# Patient Record
Sex: Female | Born: 1945 | Race: Black or African American | Hispanic: No | Smoking: Former smoker
Health system: Southern US, Community
[De-identification: ages and names within clinical notes are randomized; demographics above are authoritative.]

## PROBLEM LIST (undated history)

## (undated) DIAGNOSIS — K7460 Unspecified cirrhosis of liver: Secondary | ICD-10-CM

## (undated) DIAGNOSIS — Z789 Other specified health status: Secondary | ICD-10-CM

## (undated) DIAGNOSIS — D869 Sarcoidosis, unspecified: Secondary | ICD-10-CM

## (undated) DIAGNOSIS — E7800 Pure hypercholesterolemia, unspecified: Secondary | ICD-10-CM

## (undated) DIAGNOSIS — S065X9A Traumatic subdural hemorrhage with loss of consciousness of unspecified duration, initial encounter: Secondary | ICD-10-CM

## (undated) DIAGNOSIS — IMO0001 Reserved for inherently not codable concepts without codable children: Secondary | ICD-10-CM

## (undated) DIAGNOSIS — K219 Gastro-esophageal reflux disease without esophagitis: Secondary | ICD-10-CM

## (undated) DIAGNOSIS — E11319 Type 2 diabetes mellitus with unspecified diabetic retinopathy without macular edema: Secondary | ICD-10-CM

## (undated) DIAGNOSIS — E119 Type 2 diabetes mellitus without complications: Secondary | ICD-10-CM

## (undated) DIAGNOSIS — S065XAA Traumatic subdural hemorrhage with loss of consciousness status unknown, initial encounter: Secondary | ICD-10-CM

## (undated) DIAGNOSIS — I10 Essential (primary) hypertension: Secondary | ICD-10-CM

## (undated) DIAGNOSIS — I639 Cerebral infarction, unspecified: Secondary | ICD-10-CM

## (undated) HISTORY — DX: Cerebral infarction, unspecified: I63.9

## (undated) HISTORY — PX: CHOLECYSTECTOMY: SHX55

## (undated) HISTORY — PX: KNEE SURGERY: SHX244

---

## 2015-02-23 ENCOUNTER — Emergency Department (HOSPITAL_BASED_OUTPATIENT_CLINIC_OR_DEPARTMENT_OTHER)
Admission: EM | Admit: 2015-02-23 | Discharge: 2015-02-23 | Disposition: A | Discharge status: Home / Self Care | Payer: Medicare Other | Attending: Emergency Medicine | Admitting: Emergency Medicine

## 2015-02-23 ENCOUNTER — Encounter (HOSPITAL_BASED_OUTPATIENT_CLINIC_OR_DEPARTMENT_OTHER): Payer: Self-pay | Admitting: *Deleted

## 2015-02-23 DIAGNOSIS — Z862 Personal history of diseases of the blood and blood-forming organs and certain disorders involving the immune mechanism: Secondary | ICD-10-CM | POA: Insufficient documentation

## 2015-02-23 DIAGNOSIS — Z87891 Personal history of nicotine dependence: Secondary | ICD-10-CM | POA: Diagnosis not present

## 2015-02-23 DIAGNOSIS — Z7982 Long term (current) use of aspirin: Secondary | ICD-10-CM | POA: Insufficient documentation

## 2015-02-23 DIAGNOSIS — N390 Urinary tract infection, site not specified: Secondary | ICD-10-CM | POA: Insufficient documentation

## 2015-02-23 DIAGNOSIS — Z79899 Other long term (current) drug therapy: Secondary | ICD-10-CM | POA: Insufficient documentation

## 2015-02-23 DIAGNOSIS — K219 Gastro-esophageal reflux disease without esophagitis: Secondary | ICD-10-CM | POA: Insufficient documentation

## 2015-02-23 DIAGNOSIS — E119 Type 2 diabetes mellitus without complications: Secondary | ICD-10-CM | POA: Diagnosis not present

## 2015-02-23 DIAGNOSIS — Z794 Long term (current) use of insulin: Secondary | ICD-10-CM | POA: Insufficient documentation

## 2015-02-23 DIAGNOSIS — I10 Essential (primary) hypertension: Secondary | ICD-10-CM | POA: Insufficient documentation

## 2015-02-23 DIAGNOSIS — R1110 Vomiting, unspecified: Secondary | ICD-10-CM | POA: Diagnosis present

## 2015-02-23 DIAGNOSIS — K529 Noninfective gastroenteritis and colitis, unspecified: Secondary | ICD-10-CM | POA: Diagnosis not present

## 2015-02-23 HISTORY — DX: Essential (primary) hypertension: I10

## 2015-02-23 HISTORY — DX: Type 2 diabetes mellitus without complications: E11.9

## 2015-02-23 HISTORY — DX: Sarcoidosis, unspecified: D86.9

## 2015-02-23 HISTORY — DX: Gastro-esophageal reflux disease without esophagitis: K21.9

## 2015-02-23 LAB — URINE MICROSCOPIC-ADD ON

## 2015-02-23 LAB — CBC WITH DIFFERENTIAL/PLATELET
BASOS ABS: 0 10*3/uL (ref 0.0–0.1)
BASOS PCT: 0 % (ref 0–1)
EOS ABS: 0.1 10*3/uL (ref 0.0–0.7)
EOS PCT: 1 % (ref 0–5)
HEMATOCRIT: 35.6 % — AB (ref 36.0–46.0)
HEMOGLOBIN: 12.5 g/dL (ref 12.0–15.0)
Lymphocytes Relative: 37 % (ref 12–46)
Lymphs Abs: 2.9 10*3/uL (ref 0.7–4.0)
MCH: 27.2 pg (ref 26.0–34.0)
MCHC: 35.1 g/dL (ref 30.0–36.0)
MCV: 77.6 fL — AB (ref 78.0–100.0)
MONO ABS: 0.7 10*3/uL (ref 0.1–1.0)
MONOS PCT: 8 % (ref 3–12)
Neutro Abs: 4.2 10*3/uL (ref 1.7–7.7)
Neutrophils Relative %: 54 % (ref 43–77)
Platelets: 277 10*3/uL (ref 150–400)
RBC: 4.59 MIL/uL (ref 3.87–5.11)
RDW: 12.8 % (ref 11.5–15.5)
WBC: 7.9 10*3/uL (ref 4.0–10.5)

## 2015-02-23 LAB — COMPREHENSIVE METABOLIC PANEL
ALK PHOS: 95 U/L (ref 39–117)
ALT: 64 U/L — ABNORMAL HIGH (ref 0–35)
AST: 42 U/L — AB (ref 0–37)
Albumin: 4.2 g/dL (ref 3.5–5.2)
Anion gap: 8 (ref 5–15)
BUN: 19 mg/dL (ref 6–23)
CHLORIDE: 104 mmol/L (ref 96–112)
CO2: 28 mmol/L (ref 19–32)
Calcium: 9.5 mg/dL (ref 8.4–10.5)
Creatinine, Ser: 0.75 mg/dL (ref 0.50–1.10)
GFR calc Af Amer: >90 mL/min (ref >90)
GFR calc non Af Amer: 84 mL/min — ABNORMAL LOW (ref >90)
GLUCOSE: 158 mg/dL — AB (ref 70–99)
POTASSIUM: 4 mmol/L (ref 3.5–5.1)
SODIUM: 140 mmol/L (ref 135–145)
Total Bilirubin: 0.3 mg/dL (ref 0.3–1.2)
Total Protein: 8.1 g/dL (ref 6.0–8.3)

## 2015-02-23 LAB — URINALYSIS, ROUTINE W REFLEX MICROSCOPIC
Bilirubin Urine: NEGATIVE
Glucose, UA: NEGATIVE mg/dL
KETONES UR: NEGATIVE mg/dL
Nitrite: NEGATIVE
PH: 5 (ref 5.0–8.0)
Protein, ur: 30 mg/dL — AB
Specific Gravity, Urine: 1.022 (ref 1.005–1.030)
Urobilinogen, UA: 0.2 mg/dL (ref 0.0–1.0)

## 2015-02-23 MED ORDER — CIPROFLOXACIN HCL 500 MG PO TABS: 500.0000 mg | ORAL_TABLET | Freq: Two times a day (BID) | ORAL | Status: DC

## 2015-02-23 MED ORDER — SODIUM CHLORIDE 0.9 % IV BOLUS (SEPSIS)
1000.0000 mL | Freq: Once | INTRAVENOUS | Status: AC
Start: 2015-02-23 — End: 2015-02-23
  Administered 2015-02-23: 1000 mL via INTRAVENOUS

## 2015-02-23 MED ORDER — ONDANSETRON 8 MG PO TBDP: ORAL_TABLET | ORAL | Status: DC

## 2015-02-23 NOTE — ED Provider Notes (Signed)
CSN: [id number]     Arrival date & time 02/23/15  1826 History  This chart was scribed for Monica Speak, MD by Tula Nakayama, ED Scribe. This patient was seen in room MH11/MH11 and the patient's care was started at 6:38 PM.    Chief Complaint  Patient presents with  . Emesis   Patient is a 69 y.o. female presenting with vomiting. The history is provided by the patient. No language interpreter was used.  Emesis Severity:  Moderate Duration:  3 hours Timing:  Intermittent Quality:  Unable to specify Progression:  Unchanged Chronicity:  New Recent urination:  Normal Relieved by:  None tried Worsened by:  Nothing tried Ineffective treatments:  None tried Associated symptoms: no abdominal pain and no fever   Risk factors: no sick contacts and no suspect food intake     HPI Comments: Monica Pearson is a 70 y.o. female with a history of DM, GERD, HTN and Sarcoidosis, who presents to the Emergency Department complaining of constant, moderate nausea that started 2.5 hours ago. She states vomiting as an associated symptoms. Pt has not tried any treatment PTA. Pt reports her blood sugar measured 139 this morning. Her last BM was this morning. She had an artichoke sandwich earlier today. Pt denies abdominal pain, CP, SOB and fever as associated symptoms.   Past Medical History  Diagnosis Date  . Hypertension   . GERD (gastroesophageal reflux disease)   . Diabetes mellitus without complication   . Sarcoidosis    History reviewed. No pertinent past surgical history. History reviewed. No pertinent family history. History  Substance Use Topics  . Smoking status: Former Research scientist (life sciences)  . Smokeless tobacco: Not on file  . Alcohol Use: Not on file   OB History    No data available     Review of Systems  Constitutional: Negative for fever.  Respiratory: Negative for shortness of breath.   Cardiovascular: Negative for chest pain.  Gastrointestinal: Positive for nausea and vomiting. Negative for  abdominal pain.  All other systems reviewed and are negative.  Allergies  Review of patient's allergies indicates no known allergies.  Home Medications   Prior to Admission medications   Medication Sig Start Date End Date Taking? Authorizing Provider  aspirin 81 MG chewable tablet Chew 81 mg by mouth daily.   Yes Historical Provider, MD  atenolol-chlorthalidone (TENORETIC) 100-25 MG per tablet Take 1 tablet by mouth daily.   Yes Historical Provider, MD  benazepril (LOTENSIN) 40 MG tablet Take 40 mg by mouth daily.   Yes Historical Provider, MD  calcium carbonate (OS-CAL) 600 MG TABS tablet Take 600 mg by mouth 2 (two) times daily with a meal.   Yes Historical Provider, MD  insulin NPH-regular Human (NOVOLIN 70/30) (70-30) 100 UNIT/ML injection Inject into the skin. Per sliding scale per pt.   Yes Historical Provider, MD  Liraglutide (VICTOZA [place]) Inject into the skin.   Yes Historical Provider, MD  metFORMIN (GLUCOPHAGE) 500 MG tablet Take 500 mg by mouth 2 (two) times daily with a meal.   Yes Historical Provider, MD  omeprazole (PRILOSEC) 20 MG capsule Take 20 mg by mouth daily.   Yes Historical Provider, MD   BP 171/63 mmHg  Pulse 73  Temp(Src) 98.9 F (37.2 C) (Oral)  Resp 20  Ht 5' 4"  (1.626 m)  Wt 246 lb (111.585 kg)  BMI 42.21 kg/m2  SpO2 94%  LMP  (Approximate) Physical Exam  Constitutional: She is oriented to person, place, and time. She appears  well-developed and well-nourished. No distress.  HENT:  Head: Normocephalic and atraumatic.  Eyes: EOM are normal.  Neck: Normal range of motion.  Cardiovascular: Normal rate, regular rhythm and normal heart sounds.   Pulmonary/Chest: Effort normal and breath sounds normal.  Abdominal: Soft. She exhibits no distension. There is no tenderness.  Musculoskeletal: Normal range of motion.  Neurological: She is alert and oriented to person, place, and time.  Skin: Skin is warm and dry.  Psychiatric: She has a normal mood and affect.  Judgment normal.  Nursing note and vitals reviewed.   ED Course  Procedures   DIAGNOSTIC STUDIES: Oxygen Saturation is 94% on RA, normal by my interpretation.    COORDINATION OF CARE: 6:42 PM Discussed treatment plan with pt which includes lab work and IV fluids. Pt agreed to plan.   Labs Review Labs Reviewed  CBC WITH DIFFERENTIAL/PLATELET  COMPREHENSIVE METABOLIC PANEL  URINALYSIS, ROUTINE W REFLEX MICROSCOPIC    Imaging Review No results found.   EKG Interpretation None      MDM   Final diagnoses:  None    Patient is a 69 year old female who presents with complaints of nausea, vomiting, and diarrhea that started several hours prior to presentation. Her presentation exam, and workup are consistent with a viral gastroenteritis. She is feeling better with medications in the ER and I believe is appropriate for discharge. She also believes she may have a UTI and her urinalysis does reflect this. I feel this is more of an incidental finding as opposed to the cause of her vomiting and diarrhea, but will be treated with antibiotics and when necessary return.  I personally performed the services described in this documentation, which was scribed in my presence. The recorded information has been reviewed and is accurate.      Monica Speak, MD 02/23/15 2023

## 2015-02-23 NOTE — Discharge Instructions (Signed)
Zofran as prescribed as needed for nausea.  Cipro as prescribed for your urinary tract infection.  Return to the emergency department for severe abdominal pain, bloody stool, or other new and concerning symptoms.   Urinary Tract Infection Urinary tract infections (UTIs) can develop anywhere along your urinary tract. Your urinary tract is your body's drainage system for removing wastes and extra water. Your urinary tract includes two kidneys, two ureters, a bladder, and a urethra. Your kidneys are a pair of bean-shaped organs. Each kidney is about the size of your fist. They are located below your ribs, one on each side of your spine. CAUSES Infections are caused by microbes, which are microscopic organisms, including fungi, viruses, and bacteria. These organisms are so small that they can only be seen through a microscope. Bacteria are the microbes that most commonly cause UTIs. SYMPTOMS  Symptoms of UTIs may vary by age and gender of the patient and by the location of the infection. Symptoms in young women typically include a frequent and intense urge to urinate and a painful, burning feeling in the bladder or urethra during urination. Older women and men are more likely to be tired, shaky, and weak and have muscle aches and abdominal pain. A fever may mean the infection is in your kidneys. Other symptoms of a kidney infection include pain in your back or sides below the ribs, nausea, and vomiting. DIAGNOSIS To diagnose a UTI, your caregiver will ask you about your symptoms. Your caregiver also will ask to provide a urine sample. The urine sample will be tested for bacteria and white blood cells. White blood cells are made by your body to help fight infection. TREATMENT  Typically, UTIs can be treated with medication. Because most UTIs are caused by a bacterial infection, they usually can be treated with the use of antibiotics. The choice of antibiotic and length of treatment depend on your symptoms  and the type of bacteria causing your infection. HOME CARE INSTRUCTIONS  If you were prescribed antibiotics, take them exactly as your caregiver instructs you. Finish the medication even if you feel better after you have only taken some of the medication.  Drink enough water and fluids to keep your urine clear or pale yellow.  Avoid caffeine, tea, and carbonated beverages. They tend to irritate your bladder.  Empty your bladder often. Avoid holding urine for long periods of time.  Empty your bladder before and after sexual intercourse.  After a bowel movement, women should cleanse from front to back. Use each tissue only once. SEEK MEDICAL CARE IF:   You have back pain.  You develop a fever.  Your symptoms do not begin to resolve within 3 days. SEEK IMMEDIATE MEDICAL CARE IF:   You have severe back pain or lower abdominal pain.  You develop chills.  You have nausea or vomiting.  You have continued burning or discomfort with urination. MAKE SURE YOU:   Understand these instructions.  Will watch your condition.  Will get help right away if you are not doing well or get worse. Document Released: 08/21/2005 Document Revised: 05/12/2012 Document Reviewed: 12/20/2011 [facility] Patient Information 2015 [place], [place]. This information is not intended to replace advice given to you by your health care provider. Make sure you discuss any questions you have with your health care provider.  Viral Gastroenteritis Viral gastroenteritis is also known as stomach flu. This condition affects the stomach and intestinal tract. It can cause sudden diarrhea and vomiting. The illness typically lasts 3  to 8 days. Most people develop an immune response that eventually gets rid of the virus. While this natural response develops, the virus can make you quite ill. CAUSES  Many different viruses can cause gastroenteritis, such as rotavirus or noroviruses. You can catch one of these viruses by  consuming contaminated food or water. You may also catch a virus by sharing utensils or other personal items with an infected person or by touching a contaminated surface. SYMPTOMS  The most common symptoms are diarrhea and vomiting. These problems can cause a severe loss of body fluids (dehydration) and a body salt (electrolyte) imbalance. Other symptoms may include:  Fever.  Headache.  Fatigue.  Abdominal pain. DIAGNOSIS  Your caregiver can usually diagnose viral gastroenteritis based on your symptoms and a physical exam. A stool sample may also be taken to test for the presence of viruses or other infections. TREATMENT  This illness typically goes away on its own. Treatments are aimed at rehydration. The most serious cases of viral gastroenteritis involve vomiting so severely that you are not able to keep fluids down. In these cases, fluids must be given through an intravenous line (IV). HOME CARE INSTRUCTIONS   Drink enough fluids to keep your urine clear or pale yellow. Drink small amounts of fluids frequently and increase the amounts as tolerated.  Ask your caregiver for specific rehydration instructions.  Avoid:  Foods high in sugar.  Alcohol.  Carbonated drinks.  Tobacco.  Juice.  Caffeine drinks.  Extremely hot or cold fluids.  Fatty, greasy foods.  Too much intake of anything at one time.  Dairy products until 24 to 48 hours after diarrhea stops.  You may consume probiotics. Probiotics are active cultures of beneficial bacteria. They may lessen the amount and number of diarrheal stools in adults. Probiotics can be found in yogurt with active cultures and in supplements.  Wash your hands well to avoid spreading the virus.  Only take over-the-counter or prescription medicines for pain, discomfort, or fever as directed by your caregiver. Do not give aspirin to children. Antidiarrheal medicines are not recommended.  Ask your caregiver if you should continue to  take your regular prescribed and over-the-counter medicines.  Keep all follow-up appointments as directed by your caregiver. SEEK IMMEDIATE MEDICAL CARE IF:   You are unable to keep fluids down.  You do not urinate at least once every 6 to 8 hours.  You develop shortness of breath.  You notice blood in your stool or vomit. This may look like coffee grounds.  You have abdominal pain that increases or is concentrated in one small area (localized).  You have persistent vomiting or diarrhea.  You have a fever.  The patient is a child younger than 3 months, and he or she has a fever.  The patient is a child older than 3 months, and he or she has a fever and persistent symptoms.  The patient is a child older than 3 months, and he or she has a fever and symptoms suddenly get worse.  The patient is a baby, and he or she has no tears when crying. MAKE SURE YOU:   Understand these instructions.  Will watch your condition.  Will get help right away if you are not doing well or get worse. Document Released: 11/11/2005 Document Revised: 02/03/2012 Document Reviewed: 08/28/2011 [facility] Patient Information 2015 [place], [place]. This information is not intended to replace advice given to you by your health care provider. Make sure you discuss any questions you  have with your health care provider.

## 2015-02-23 NOTE — ED Notes (Signed)
Pt to room 11 by ems, pt is vomiting into emesis bag, reports sudden onset of n/v at 4pm. Pt reports abd cramping pains, but "no diarrhea yet". Denies fevers, reports normal bm this am.

## 2016-12-14 ENCOUNTER — Encounter (HOSPITAL_BASED_OUTPATIENT_CLINIC_OR_DEPARTMENT_OTHER): Payer: Self-pay | Admitting: Emergency Medicine

## 2016-12-14 ENCOUNTER — Emergency Department (HOSPITAL_BASED_OUTPATIENT_CLINIC_OR_DEPARTMENT_OTHER): Payer: Medicare Other

## 2016-12-14 ENCOUNTER — Observation Stay (HOSPITAL_BASED_OUTPATIENT_CLINIC_OR_DEPARTMENT_OTHER)
Admission: EM | Admit: 2016-12-14 | Discharge: 2016-12-15 | Disposition: A | Discharge status: Home / Self Care | Payer: Medicare Other | Attending: Internal Medicine | Admitting: Internal Medicine

## 2016-12-14 DIAGNOSIS — G4733 Obstructive sleep apnea (adult) (pediatric): Secondary | ICD-10-CM | POA: Diagnosis not present

## 2016-12-14 DIAGNOSIS — Z79899 Other long term (current) drug therapy: Secondary | ICD-10-CM | POA: Diagnosis not present

## 2016-12-14 DIAGNOSIS — Z9989 Dependence on other enabling machines and devices: Secondary | ICD-10-CM

## 2016-12-14 DIAGNOSIS — G8191 Hemiplegia, unspecified affecting right dominant side: Secondary | ICD-10-CM | POA: Diagnosis not present

## 2016-12-14 DIAGNOSIS — R4781 Slurred speech: Secondary | ICD-10-CM | POA: Insufficient documentation

## 2016-12-14 DIAGNOSIS — Z87891 Personal history of nicotine dependence: Secondary | ICD-10-CM | POA: Insufficient documentation

## 2016-12-14 DIAGNOSIS — I10 Essential (primary) hypertension: Secondary | ICD-10-CM | POA: Insufficient documentation

## 2016-12-14 DIAGNOSIS — D869 Sarcoidosis, unspecified: Secondary | ICD-10-CM | POA: Diagnosis not present

## 2016-12-14 DIAGNOSIS — Z794 Long term (current) use of insulin: Secondary | ICD-10-CM

## 2016-12-14 DIAGNOSIS — I639 Cerebral infarction, unspecified: Secondary | ICD-10-CM | POA: Diagnosis not present

## 2016-12-14 DIAGNOSIS — R42 Dizziness and giddiness: Secondary | ICD-10-CM | POA: Diagnosis not present

## 2016-12-14 DIAGNOSIS — G458 Other transient cerebral ischemic attacks and related syndromes: Secondary | ICD-10-CM | POA: Diagnosis not present

## 2016-12-14 DIAGNOSIS — K219 Gastro-esophageal reflux disease without esophagitis: Secondary | ICD-10-CM | POA: Diagnosis not present

## 2016-12-14 DIAGNOSIS — E785 Hyperlipidemia, unspecified: Secondary | ICD-10-CM | POA: Insufficient documentation

## 2016-12-14 DIAGNOSIS — Z789 Other specified health status: Secondary | ICD-10-CM

## 2016-12-14 DIAGNOSIS — E1165 Type 2 diabetes mellitus with hyperglycemia: Secondary | ICD-10-CM | POA: Diagnosis not present

## 2016-12-14 DIAGNOSIS — Z7982 Long term (current) use of aspirin: Secondary | ICD-10-CM | POA: Insufficient documentation

## 2016-12-14 DIAGNOSIS — E119 Type 2 diabetes mellitus without complications: Secondary | ICD-10-CM

## 2016-12-14 DIAGNOSIS — IMO0001 Reserved for inherently not codable concepts without codable children: Secondary | ICD-10-CM

## 2016-12-14 DIAGNOSIS — G459 Transient cerebral ischemic attack, unspecified: Secondary | ICD-10-CM | POA: Diagnosis present

## 2016-12-14 LAB — COMPREHENSIVE METABOLIC PANEL
ALK PHOS: 101 U/L (ref 38–126)
ALT: 66 U/L — ABNORMAL HIGH (ref 14–54)
ANION GAP: 9 (ref 5–15)
AST: 44 U/L — ABNORMAL HIGH (ref 15–41)
Albumin: 3.8 g/dL (ref 3.5–5.0)
BUN: 21 mg/dL — ABNORMAL HIGH (ref 6–20)
CALCIUM: 9.7 mg/dL (ref 8.9–10.3)
CO2: 28 mmol/L (ref 22–32)
Chloride: 100 mmol/L — ABNORMAL LOW (ref 101–111)
Creatinine, Ser: 0.76 mg/dL (ref 0.44–1.00)
GFR calc non Af Amer: >60 mL/min (ref >60)
Glucose, Bld: 180 mg/dL — ABNORMAL HIGH (ref 65–99)
Potassium: 4.4 mmol/L (ref 3.5–5.1)
SODIUM: 137 mmol/L (ref 135–145)
Total Bilirubin: 0.4 mg/dL (ref 0.3–1.2)
Total Protein: 7.6 g/dL (ref 6.5–8.1)

## 2016-12-14 LAB — CBC
HCT: 34.1 % — ABNORMAL LOW (ref 36.0–46.0)
Hemoglobin: 12 g/dL (ref 12.0–15.0)
MCH: 27.5 pg (ref 26.0–34.0)
MCHC: 35.2 g/dL (ref 30.0–36.0)
MCV: 78 fL (ref 78.0–100.0)
PLATELETS: 266 10*3/uL (ref 150–400)
RBC: 4.37 MIL/uL (ref 3.87–5.11)
RDW: 13.3 % (ref 11.5–15.5)
WBC: 6.1 10*3/uL (ref 4.0–10.5)

## 2016-12-14 LAB — DIFFERENTIAL
BASOS PCT: 1 %
Basophils Absolute: 0 10*3/uL (ref 0.0–0.1)
EOS ABS: 0.1 10*3/uL (ref 0.0–0.7)
EOS PCT: 2 %
LYMPHS PCT: 44 %
Lymphs Abs: 2.7 10*3/uL (ref 0.7–4.0)
MONO ABS: 0.8 10*3/uL (ref 0.1–1.0)
Monocytes Relative: 13 %
Neutro Abs: 2.4 10*3/uL (ref 1.7–7.7)
Neutrophils Relative %: 40 %

## 2016-12-14 LAB — PROTIME-INR
INR: 0.97
PROTHROMBIN TIME: 12.9 s (ref 11.4–15.2)

## 2016-12-14 LAB — APTT: aPTT: 49 seconds — ABNORMAL HIGH (ref 24–36)

## 2016-12-14 LAB — GLUCOSE, CAPILLARY: Glucose-Capillary: 163 mg/dL — ABNORMAL HIGH (ref 65–99)

## 2016-12-14 LAB — CBG MONITORING, ED: Glucose-Capillary: 175 mg/dL — ABNORMAL HIGH (ref 65–99)

## 2016-12-14 LAB — TROPONIN I: Troponin I: <0.03 ng/mL (ref <0.03)

## 2016-12-14 MED ORDER — ACETAMINOPHEN 650 MG RE SUPP: 650.0000 mg | RECTAL | Status: DC | PRN

## 2016-12-14 MED ORDER — EZETIMIBE 10 MG PO TABS
10.0000 mg | ORAL_TABLET | Freq: Every morning | ORAL | Status: DC
  Administered 2016-12-15: 10 mg via ORAL
  Filled 2016-12-14: qty 1

## 2016-12-14 MED ORDER — INSULIN ASPART 100 UNIT/ML ~~LOC~~ SOLN
0.0000 [IU] | Freq: Three times a day (TID) | SUBCUTANEOUS | Status: DC
  Administered 2016-12-15: 11 [IU] via SUBCUTANEOUS
  Administered 2016-12-15: 3 [IU] via SUBCUTANEOUS

## 2016-12-14 MED ORDER — ATENOLOL 25 MG PO TABS
50.0000 mg | ORAL_TABLET | Freq: Every day | ORAL | Status: DC
  Administered 2016-12-15: 50 mg via ORAL
  Filled 2016-12-14: qty 2

## 2016-12-14 MED ORDER — STROKE: EARLY STAGES OF RECOVERY BOOK
Freq: Once | Status: AC
  Administered 2016-12-15: 10:00:00
  Filled 2016-12-14: qty 1

## 2016-12-14 MED ORDER — SENNOSIDES-DOCUSATE SODIUM 8.6-50 MG PO TABS: 1.0000 | ORAL_TABLET | Freq: Every evening | ORAL | Status: DC | PRN

## 2016-12-14 MED ORDER — ACETAMINOPHEN 325 MG PO TABS: 650.0000 mg | ORAL_TABLET | ORAL | Status: DC | PRN

## 2016-12-14 MED ORDER — INSULIN GLARGINE 100 UNIT/ML ~~LOC~~ SOLN
35.0000 [IU] | Freq: Every day | SUBCUTANEOUS | Status: DC
  Filled 2016-12-14 (×2): qty 0.35

## 2016-12-14 MED ORDER — INSULIN ASPART 100 UNIT/ML ~~LOC~~ SOLN: 0.0000 [IU] | Freq: Every day | SUBCUTANEOUS | Status: DC

## 2016-12-14 MED ORDER — SODIUM CHLORIDE 0.9 % IV SOLN
INTRAVENOUS | Status: DC
  Administered 2016-12-15: 02:00:00 via INTRAVENOUS

## 2016-12-14 MED ORDER — ASPIRIN EC 325 MG PO TBEC
325.0000 mg | DELAYED_RELEASE_TABLET | Freq: Every day | ORAL | Status: DC
  Administered 2016-12-15: 325 mg via ORAL
  Filled 2016-12-14: qty 1

## 2016-12-14 MED ORDER — PANTOPRAZOLE SODIUM 40 MG PO TBEC
40.0000 mg | DELAYED_RELEASE_TABLET | Freq: Every day | ORAL | Status: DC
  Administered 2016-12-15: 40 mg via ORAL
  Filled 2016-12-14: qty 1

## 2016-12-14 MED ORDER — ACETAMINOPHEN 160 MG/5ML PO SOLN: 650.0000 mg | ORAL | Status: DC | PRN

## 2016-12-14 NOTE — ED Notes (Signed)
Pt's earrings given to visitor at bedside. Erven Colla.

## 2016-12-14 NOTE — Progress Notes (Signed)
Monica Pearson admissions and consults paged.

## 2016-12-14 NOTE — ED Provider Notes (Signed)
[place] DEPT [facility] Provider Note   CSN: [id number] Arrival date & time: 12/14/16  1522  By signing my name below, I, Judithe Modest, attest that this documentation has been prepared under the direction and in the presence of Malvin Johns, MD. Electronically Signed: Judithe Modest, ER Scribe. 07/06/2016. 3:59 PM.  History   Chief Complaint Chief Complaint  Patient presents with  . Dizziness   The history is provided by the patient. No language interpreter was used.    HPI Comments: Monica Pearson is a 71 y.o. female who presents to the Emergency Department complaining of balance problems, slight right lean while walking and weakness in her right hand that started between 11am and 12pm today. She has noticed decreased strength in her right hand. She also has noticed that her speech has been slurred this afternoon. She endorses associated nausea and light headedness. She states she has not felt 100% for three days (felt anxious, restless). She had one event of vertigo two months ago. She denies visual disturbance. She states her strength is back to normal right now.   Her daughter stated that she was slurring her speach when she called to have her taken her to the ER around 3pm.    Past Medical History:  Diagnosis Date  . Diabetes mellitus without complication ([place])   . GERD (gastroesophageal reflux disease)   . Hypertension   . Sarcoidosis [facility])     Patient Active Problem List   Diagnosis Date Noted  . TIA (transient ischemic attack) 12/14/2016    Past Surgical History:  Procedure Laterality Date  . CHOLECYSTECTOMY    . KNEE SURGERY      OB History    No data available       Home Medications    Prior to Admission medications   Medication Sig Start Date End Date Taking? Authorizing Provider  chlorthalidone (HYGROTON) 25 MG tablet Take 25 mg by mouth daily.   Yes Historical Provider, MD  ezetimibe (ZETIA) 10 MG tablet Take 10 mg by mouth daily.   Yes Historical  Provider, MD  insulin glargine (LANTUS) 100 UNIT/ML injection Inject into the skin at bedtime.   Yes Historical Provider, MD  insulin lispro (HUMALOG) 100 UNIT/ML injection Inject into the skin 3 (three) times daily before meals.   Yes Historical Provider, MD  aspirin 81 MG chewable tablet Chew 81 mg by mouth daily.    Historical Provider, MD  atenolol-chlorthalidone (TENORETIC) 100-25 MG per tablet Take 1 tablet by mouth daily.    Historical Provider, MD  benazepril (LOTENSIN) 40 MG tablet Take 40 mg by mouth daily.    Historical Provider, MD  calcium carbonate (OS-CAL) 600 MG TABS tablet Take 600 mg by mouth 2 (two) times daily with a meal.    Historical Provider, MD  ciprofloxacin (CIPRO) 500 MG tablet Take 1 tablet (500 mg total) by mouth 2 (two) times daily. 02/23/15   [name], MD  insulin NPH-regular Human (NOVOLIN 70/30) (70-30) 100 UNIT/ML injection Inject into the skin. Per sliding scale per pt.    Historical Provider, MD  Liraglutide (VICTOZA [place]) Inject into the skin.    Historical Provider, MD  metFORMIN (GLUCOPHAGE) 500 MG tablet Take 500 mg by mouth 2 (two) times daily with a meal.    Historical Provider, MD  omeprazole (PRILOSEC) 20 MG capsule Take 20 mg by mouth daily.    Historical Provider, MD  ondansetron (ZOFRAN ODT) 8 MG disintegrating tablet 56m ODT q4 hours prn nausea  02/23/15   [name], MD    Family History History reviewed. No pertinent family history.  Social History Social History  Substance Use Topics  . Smoking status: Former Research scientist (life sciences)  . Smokeless tobacco: Never Used  . Alcohol use No     Allergies   Patient has no known allergies.   Review of Systems Review of Systems  Constitutional: Negative for chills, diaphoresis, fatigue and fever.  HENT: Negative for congestion, rhinorrhea and sneezing.   Eyes: Negative.   Respiratory: Negative for cough, chest tightness and shortness of breath.   Cardiovascular: Negative for chest pain and leg swelling.    Gastrointestinal: Positive for nausea. Negative for abdominal pain, blood in stool, diarrhea and vomiting.  Genitourinary: Negative for difficulty urinating, flank pain, frequency and hematuria.  Musculoskeletal: Negative for arthralgias and back pain.  Skin: Negative for rash.  Neurological: Positive for dizziness, speech difficulty and weakness. Negative for numbness and headaches.  Psychiatric/Behavioral: Negative for confusion.     Physical Exam Updated Vital Signs BP 116/75   Pulse 69   Temp 97.7 F (36.5 C) (Oral)   Resp 16   Ht 5' 4"  (1.626 m)   Wt 234 lb 8 oz (106.4 kg)   LMP  (Approximate)   SpO2 98%   BMI 40.25 kg/m   Physical Exam  Constitutional: She is oriented to person, place, and time. She appears well-developed and well-nourished.  HENT:  Head: Normocephalic and atraumatic.  Eyes: Pupils are equal, round, and reactive to light.  Neck: Normal range of motion. Neck supple.  Cardiovascular: Normal rate, regular rhythm and normal heart sounds.   Pulmonary/Chest: Effort normal and breath sounds normal. No respiratory distress. She has no wheezes. She has no rales. She exhibits no tenderness.  Abdominal: Soft. Bowel sounds are normal. There is no tenderness. There is no rebound and no guarding.  Musculoskeletal: Normal range of motion. She exhibits no edema.  Lymphadenopathy:    She has no cervical adenopathy.  Neurological: She is alert and oriented to person, place, and time.  Motor 5/5 all extremities Sensation grossly intact to LT all extremities Finger to Nose intact, no pronator drift CN II-XII grossly intact No visual field deficit, no neglect   Skin: Skin is warm and dry. No rash noted.  Psychiatric: She has a normal mood and affect.     ED Treatments / Results  DIAGNOSTIC STUDIES: Oxygen Saturation is 97% on RA, normal by my interpretation.    COORDINATION OF CARE: 3:54 PM Discussed treatment plan with pt at bedside and pt agreed to  plan.  Labs (all labs ordered are listed, but only abnormal results are displayed) Labs Reviewed  APTT - Abnormal; Notable for the following:       Result Value   aPTT 49 (*)    All other components within normal limits  CBC - Abnormal; Notable for the following:    HCT 34.1 (*)    All other components within normal limits  COMPREHENSIVE METABOLIC PANEL - Abnormal; Notable for the following:    Chloride 100 (*)    Glucose, Bld 180 (*)    BUN 21 (*)    AST 44 (*)    ALT 66 (*)    All other components within normal limits  CBG MONITORING, ED - Abnormal; Notable for the following:    Glucose-Capillary 175 (*)    All other components within normal limits  PROTIME-INR  DIFFERENTIAL  TROPONIN I    EKG  EKG Interpretation  Date/Time:  Saturday December 14 2016 15:34:50 EST Ventricular Rate:  65 PR Interval:  200 QRS Duration: 76 QT Interval:  392 QTC Calculation: 407 R Axis:   51 Text Interpretation:  Normal sinus rhythm Nonspecific T wave abnormality Abnormal ECG No old tracing to compare Confirmed by [facility]  MD, MARTHA [phone]) on 12/14/2016 3:57:15 PM       Radiology Ct Head Code Stroke W/o Cm  Result Date: 12/14/2016 CLINICAL DATA:  Code stroke. Progressively worsening dizziness and slurred speech today. EXAM: CT HEAD WITHOUT CONTRAST TECHNIQUE: Contiguous axial images were obtained from the base of the skull through the vertex without intravenous contrast. COMPARISON:  12/27/2013 brain MRI FINDINGS: Brain: There is no evidence of acute cortical infarct, intracranial hemorrhage, mass, midline shift, or extra-axial fluid collection. Ventricles and sulci are normal for age. Vascular: No hyperdense vessel or unexpected calcification. Skull: No fracture or focal osseous lesion. Sinuses/Orbits: Paranasal sinuses and mastoid air cells are clear. Unremarkable orbits. Other: None. ASPECTS [facility] Stroke Program Early CT Score) - Ganglionic level infarction (caudate, lentiform nuclei,  internal capsule, insula, M1-M3 cortex): 7 - Supraganglionic infarction (M4-M6 cortex): 3 Total score (0-10 with 10 being normal): 10 IMPRESSION: 1. Unremarkable CT appearance of the brain for age. 2. ASPECTS is 10. These results were called by telephone at the time of interpretation on 12/14/2016 at 4:09 pm to Dr. Threasa Beards Toran Murch , who verbally acknowledged these results. Electronically Signed   By: Logan Bores M.D.   On: 12/14/2016 16:09    Procedures Procedures (including critical care time)  Medications Ordered in ED Medications - No data to display   Initial Impression / Assessment and Plan / ED Course  I have reviewed the triage vital signs and the nursing notes.  Pertinent labs & imaging results that were available during my care of the patient were reviewed by me and considered in my medical decision making (see chart for details).     Patient presents with slurred speech, right-sided weakness and ataxia. Her symptoms were almost back to baseline on arrival. She had some mild weakness in her right hand as compared to her left but no drift and no other deficits noted. I spoke with Dr. Orlena Sheldon within neuro hospitalist service. He did not feel that a code stroke needed to be activated. Head CT is negative for bleed or other acute abnormality. I spoke with Dr. Roel Cluck with the hospitalist service who will accept the patient for transfer to [facility].  Final Clinical Impressions(s) / ED Diagnoses   Final diagnoses:  Transient cerebral ischemia, unspecified type    New Prescriptions New Prescriptions   No medications on file     I personally performed the services described in this documentation, which was scribed in my presence.  The recorded information has been reviewed and considered.        Malvin Johns, MD 12/14/16 [phone]

## 2016-12-14 NOTE — Plan of Care (Signed)
71 yo DM2, HTN, HLD came in with TIA? LSN 11 AM Off balance leaning to the right slurred speech, right hand weakness, on arrival resolved.  Ct head negative ER spoke to Neurology who did not feel code stroke was warranted.   Accepted to neuro tele for further TIA work up  observation  [place] 6:39 PM

## 2016-12-14 NOTE — ED Triage Notes (Signed)
Patient reports that she woke up this am normal. The patient reports that she has had progressively worsening dizziness and slurred speech since about an hour ago. She reports noticing the dizziness at lunch. She reports that she eats lunch at 60

## 2016-12-14 NOTE — ED Notes (Signed)
EDP at bedside  

## 2016-12-14 NOTE — ED Notes (Signed)
[facility] arrived to transport pt

## 2016-12-14 NOTE — ED Notes (Signed)
Pt's last known normal was between 63 and 12 today. EDP reports pt had no focal neuro deficit on her exam.

## 2016-12-14 NOTE — Progress Notes (Signed)
Patient arrived to unit alert and oriented x 4. Patient denies pain. All current orders and treatment plan reviewed with patient. Patient oriented to unit. RN will continue to monitor.

## 2016-12-14 NOTE — ED Notes (Signed)
Patient transported to CT 

## 2016-12-14 NOTE — H&P (Signed)
Monica Pearson BJS:[id number] DOB: 1946/10/28 DOA: 12/14/2016     PCP: Maylon Peppers, MD   Outpatient Specialists: [facility] Patient coming from:   home Lives alone,       Chief Complaint: Dizziness slurred speech and's one-sided weakness  HPI: Monica Pearson is a 71 y.o. female with medical history significant of poorly controlled DM 2, HTN, HLD, sarcoidosis, GERD    Presented with progressive dizziness slurred speech and right hand weakness started around 11 AM versus but got no worse by 12 PM. Patient states that she woke up she was okay. Is was associated with balance problems and she was leaning to the right she had trouble holding things in her right hand. She had some nausea associated and otherwise patient states that for the past few days she's been feeling overall anxious she had an episode of vertigo 2 months ago otherwise no prior history of CVA or TIA. Her daughter called her right upper and noted that she had slurred speech and take her to emerge department 3 PM patient arrived to [facility] [place]   Regarding pertinent Chronic problems: She noticed history of diabetes not well controlled   IN ER:  Temp (24hrs), Avg:98.2 F (36.8 C), Min:97.7 F (36.5 C), Max:98.6 F (37 C)      RRR 17 pulse 70 blood pressure 142/71  WBC 6.1 hemoglobin 12 creatinine is 0.76 troponin less than 0.03 CT head unremarkable   Following Medications were ordered in ER: Medications  insulin aspart (novoLOG) injection 0-5 Units (not administered)   stroke: mapping our early stages of recovery book (not administered)  0.9 %  sodium chloride infusion (not administered)  acetaminophen (TYLENOL) tablet 650 mg (not administered)    Or  acetaminophen (TYLENOL) solution 650 mg (not administered)    Or  acetaminophen (TYLENOL) suppository 650 mg (not administered)  insulin aspart (novoLOG) injection 0-15 Units (not administered)  senna-docusate (Senokot-S) tablet 1  tablet (not administered)     ER provider discussed case with:  Neurology who felt that coats stroke was not indicated given the patient's neurological findings were improving  Hospitalist was called for admission for possible TIA  Review of Systems:    Pertinent positives include: localizing neurological complaints,  gait abnormality, slurred speech, Constitutional:  No weight loss, night sweats, Fevers, chills, fatigue, weight loss  HEENT:  No headaches, Difficulty swallowing,Tooth/dental problems,Sore throat,  No sneezing, itching, ear ache, nasal congestion, post nasal drip,  Cardio-vascular:  No chest pain, Orthopnea, PND, anasarca, dizziness, palpitations.no Bilateral lower extremity swelling  GI:  No heartburn, indigestion, abdominal pain, nausea, vomiting, diarrhea, change in bowel habits, loss of appetite, melena, blood in stool, hematemesis Resp:  no shortness of breath at rest. No dyspnea on exertion, No excess mucus, no productive cough, No non-productive cough, No coughing up of blood.No change in color of mucus.No wheezing. Skin:  no rash or lesions. No jaundice GU:  no dysuria, change in color of urine, no urgency or frequency. No straining to urinate.  No flank pain.  Musculoskeletal:  No joint pain or no joint swelling. No decreased range of motion. No back pain.  Psych:  No change in mood or affect. No depression or anxiety. No memory loss.  Neuro: no    Tingling no double vision, no  no  no confusion  As per HPI otherwise 10 point review of systems negative.   Past Medical History: Past Medical History:  Diagnosis Date  . Diabetes mellitus  without complication ([place])   . GERD (gastroesophageal reflux disease)   . Hypertension   . Sarcoidosis [facility])    Past Surgical History:  Procedure Laterality Date  . CHOLECYSTECTOMY    . KNEE SURGERY       Social History:  Ambulatory   independently      reports that she has quit smoking. She has never used  smokeless tobacco. She reports that she does not drink alcohol or use drugs.  Allergies:   Allergies  Allergen Reactions  . Celecoxib Swelling    Ankles swell  . Rofecoxib Swelling    Ankles swell  . Darvon [Propoxyphene] Nausea And Vomiting  . Hydrocodone-Acetaminophen Nausea And Vomiting  . Morphine Nausea And Vomiting       Family History:   Family History  Problem Relation Age of Onset  . Stroke Mother   . Bladder Cancer Mother   . CAD Father   . Prostate cancer Father     Medications: Prior to Admission medications   Medication Sig Start Date End Date Taking? Authorizing Provider  aspirin EC 81 MG tablet Take 81 mg by mouth at bedtime.   Yes Historical Provider, MD  atenolol (TENORMIN) 50 MG tablet Take 50 mg by mouth daily.   Yes Historical Provider, MD  benazepril (LOTENSIN) 40 MG tablet Take 40 mg by mouth daily.   Yes Historical Provider, MD  Calcium-Magnesium-Vitamin D (CALCIUM 1200+D3 PO) Take 2 tablets by mouth at bedtime.   Yes Historical Provider, MD  chlorthalidone (HYGROTON) 25 MG tablet Take 25 mg by mouth daily.   Yes Historical Provider, MD  ezetimibe (ZETIA) 10 MG tablet Take 10 mg by mouth every morning. 11/06/16 11/06/17 Yes Historical Provider, MD  glipiZIDE (GLUCOTROL XL) 10 MG 24 hr tablet Take 10 mg by mouth 2 (two) times daily.   Yes Historical Provider, MD  Insulin Glargine (LANTUS SOLOSTAR) 100 UNIT/ML Solostar Pen Inject 35 Units into the skin at bedtime.   Yes Historical Provider, MD  insulin lispro (HUMALOG KWIKPEN) 100 UNIT/ML KiwkPen Inject 15 Units into the skin 3 (three) times daily with meals. MAX OF 50 UNITS/DAY   Yes Historical Provider, MD  liraglutide (VICTOZA) 18 MG/3ML SOPN Inject 1.8 mg into the skin at bedtime.   Yes Historical Provider, MD  Magnesium 500 MG CAPS Take 500 mg by mouth at bedtime.   Yes Historical Provider, MD  metFORMIN (GLUCOPHAGE) 500 MG tablet Take 500 mg by mouth 2 (two) times daily with a meal.   Yes Historical  Provider, MD  Omega-3 Fatty Acids (KP OMEGA-3 FISH OIL) 1200 MG CAPS Take 1 capsule by mouth daily.   Yes Historical Provider, MD  omeprazole (PRILOSEC) 20 MG capsule Take 20 mg by mouth daily.   Yes Historical Provider, MD  Oregano 1500 MG CAPS Take 1,500 mg by mouth 2 (two) times daily.   Yes Historical Provider, MD  Propylene Glycol (SYSTANE BALANCE OP) Place 1-2 drops into both eyes 3 (three) times daily as needed (for dry eyes).   Yes Historical Provider, MD  atenolol-chlorthalidone (TENORETIC) 100-25 MG per tablet Take 1 tablet by mouth daily.    Historical Provider, MD  ciprofloxacin (CIPRO) 500 MG tablet Take 1 tablet (500 mg total) by mouth 2 (two) times daily. Patient not taking: Reported on 12/14/2016 02/23/15   [name], MD  insulin NPH-regular Human (NOVOLIN 70/30) (70-30) 100 UNIT/ML injection Inject into the skin. Per sliding scale per pt.    Historical Provider, MD  ondansetron [facility]  ODT) 8 MG disintegrating tablet 66m ODT q4 hours prn nausea Patient not taking: Reported on 12/14/2016 02/23/15   DVeryl Speak MD    Physical Exam: Patient Vitals for the past 24 hrs:  BP Temp Temp src Pulse Resp SpO2 Height Weight  12/14/16 2050 (!) 128/45 98.6 F (37 C) Oral 69 14 95 % 5' 4"  (1.626 m) 106.7 kg (235 lb 3.7 oz)  12/14/16 1955 142/71 - - 70 17 98 % - -  12/14/16 1930 123/58 - - 73 16 98 % - -  12/14/16 1830 116/75 - - 69 16 98 % - -  12/14/16 1803 131/61 - - 71 14 98 % - -  12/14/16 1730 124/57 - - 66 14 97 % - -  12/14/16 1700 150/68 - - 73 20 97 % - -  12/14/16 1630 148/59 - - 71 18 98 % - -  12/14/16 1600 141/93 - - 70 13 100 % - -  12/14/16 1545 178/73 - - 70 12 100 % - -  12/14/16 1538 121/84 97.7 F (36.5 C) Oral 66 20 97 % 5' 4"  (1.626 m) 106.4 kg (234 lb 8 oz)    1. General:  in No Acute distress 2. Psychological: Alert and  Oriented 3. Head/ENT:    Dry Mucous Membranes                          Head Non traumatic, neck supple                           Poor  Dentition 4. SKIN: decreased Skin turgor,  Skin clean Dry and intact no rash 5. Heart: Regular rate and rhythm no  Murmur, Rub or gallop 6. Lungs Clear to auscultation bilaterally, no wheezes or crackles   7. Abdomen: Soft, non-tender, Non distended 8. Lower extremities: no clubbing, cyanosis, or edema 9. Neurologically   strength 5 out of 5 in all 4 extremities cranial nerves II through XII intact 10. MSK: Normal range of motion   body mass index is 40.38 kg/m.  Labs on Admission:   Labs on Admission: I have personally reviewed following labs and imaging studies  CBC:  Recent Labs Lab 12/14/16 1625  WBC 6.1  NEUTROABS 2.4  HGB 12.0  HCT 34.1*  MCV 78.0  PLT 2371  Basic Metabolic Panel:  Recent Labs Lab 12/14/16 1625  NA 137  K 4.4  CL 100*  CO2 28  GLUCOSE 180*  BUN 21*  CREATININE 0.76  CALCIUM 9.7   GFR: Estimated Creatinine Clearance: 78 mL/min (by C-G formula based on SCr of 0.76 mg/dL). Liver Function Tests:  Recent Labs Lab 12/14/16 1625  AST 44*  ALT 66*  ALKPHOS 101  BILITOT 0.4  PROT 7.6  ALBUMIN 3.8   No results for input(s): LIPASE, AMYLASE in the last 168 hours. No results for input(s): AMMONIA in the last 168 hours. Coagulation Profile:  Recent Labs Lab 12/14/16 1625  INR 0.97   Cardiac Enzymes:  Recent Labs Lab 12/14/16 1625  TROPONINI <0.03   BNP (last 3 results) No results for input(s): PROBNP in the last 8760 hours. HbA1C: No results for input(s): HGBA1C in the last 72 hours. CBG:  Recent Labs Lab 12/14/16 1604  GLUCAP 175*   Lipid Profile: No results for input(s): CHOL, HDL, LDLCALC, TRIG, CHOLHDL, LDLDIRECT in the last 72 hours. Thyroid Function Tests: No results for input(s):  TSH, T4TOTAL, FREET4, T3FREE, THYROIDAB in the last 72 hours. Anemia Panel: No results for input(s): VITAMINB12, FOLATE, FERRITIN, TIBC, IRON, RETICCTPCT in the last 72 hours. Urine analysis:    Component Value Date/Time    COLORURINE YELLOW 02/23/2015 2000   APPEARANCEUR TURBID (A) 02/23/2015 2000   LABSPEC 1.022 02/23/2015 2000   PHURINE 5.0 02/23/2015 2000   GLUCOSEU NEGATIVE 02/23/2015 2000   HGBUR SMALL (A) 02/23/2015 2000   BILIRUBINUR NEGATIVE 02/23/2015 2000   KETONESUR NEGATIVE 02/23/2015 2000   PROTEINUR 30 (A) 02/23/2015 2000   UROBILINOGEN 0.2 02/23/2015 2000   NITRITE NEGATIVE 02/23/2015 2000   LEUKOCYTESUR LARGE (A) 02/23/2015 2000   Sepsis Labs: @LABRCNTIP (procalcitonin:4,lacticidven:4) )No results found for this or any previous visit (from the past 240 hour(s)).   UA  ordered  No results found for: HGBA1C  Estimated Creatinine Clearance: 78 mL/min (by C-G formula based on SCr of 0.76 mg/dL).  BNP (last 3 results) No results for input(s): PROBNP in the last 8760 hours.   ECG REPORT  Independently reviewed Rate: 65  Rhythm: Sinus rhythm ST&T Change: Flat T waves QTC 407  Filed Weights   12/14/16 1538 12/14/16 2050  Weight: 106.4 kg (234 lb 8 oz) 106.7 kg (235 lb 3.7 oz)     Cultures: No results found for: SDES, [place], CULT, REPTSTATUS   Radiological Exams on Admission: Ct Head Code Stroke W/o Cm  Result Date: 12/14/2016 CLINICAL DATA:  Code stroke. Progressively worsening dizziness and slurred speech today. EXAM: CT HEAD WITHOUT CONTRAST TECHNIQUE: Contiguous axial images were obtained from the base of the skull through the vertex without intravenous contrast. COMPARISON:  12/27/2013 brain MRI FINDINGS: Brain: There is no evidence of acute cortical infarct, intracranial hemorrhage, mass, midline shift, or extra-axial fluid collection. Ventricles and sulci are normal for age. Vascular: No hyperdense vessel or unexpected calcification. Skull: No fracture or focal osseous lesion. Sinuses/Orbits: Paranasal sinuses and mastoid air cells are clear. Unremarkable orbits. Other: None. ASPECTS [facility] Stroke Program Early CT Score) - Ganglionic level infarction (caudate, lentiform  nuclei, internal capsule, insula, M1-M3 cortex): 7 - Supraganglionic infarction (M4-M6 cortex): 3 Total score (0-10 with 10 being normal): 10 IMPRESSION: 1. Unremarkable CT appearance of the brain for age. 2. ASPECTS is 10. These results were called by telephone at the time of interpretation on 12/14/2016 at 4:09 pm to Dr. Threasa Beards BELFI , who verbally acknowledged these results. Electronically Signed   By: Logan Bores M.D.   On: 12/14/2016 16:09    Chart has been reviewed    Assessment/Plan  71 y.o. female with medical history significant of poorly controlled DM 2, HTN, HLD, sarcoidosis, GERD admitted for TIA   Present on Admission: . TIA (transient ischemic attack) -  - will admit based on TIA/CVA protocol, await results of MRA/MRI, Carotid Doppler and Echo, obtain cardiac enzymes,  ECG,   Lipid panel, TSH. Order PT/OT evaluation. Will make sure patient is on antiplatelet agent.   Neurology consulted will see patient.     Marland Kitchen GERD (gastroesophageal reflux disease) - medications . DM 2 order sliding scale, continue Lantus hold by mouth medications OSA - ordered CPAP  Other plan as per orders.  DVT prophylaxis:  SCD   Code Status:  FULL CODE e as per patient    Family Communication:   Family at  Bedside  plan of care was discussed with     Sons , Daughter in law  Disposition Plan:      To home once workup  is complete and patient is stable                          Would benefit from PT/OT eval prior to DC   ordered                                            Consults called: call Neurology in AM if MRI is positive   Admission status:  obs   Level of care   tele      I have spent a total of 66 min on this admission extra time spent to discuss case with neurology [place] 12/14/2016, 11:57 PM    [facility]  Pager [phone]   after 2 AM please page floor coverage PA If 7AM-7PM, please contact the day team taking care of the patient  [email or web address]  Password [email or web address]

## 2016-12-15 ENCOUNTER — Observation Stay (HOSPITAL_BASED_OUTPATIENT_CLINIC_OR_DEPARTMENT_OTHER): Payer: Medicare Other

## 2016-12-15 ENCOUNTER — Observation Stay (HOSPITAL_COMMUNITY): Payer: Medicare Other

## 2016-12-15 DIAGNOSIS — G458 Other transient cerebral ischemic attacks and related syndromes: Secondary | ICD-10-CM

## 2016-12-15 DIAGNOSIS — I6789 Other cerebrovascular disease: Secondary | ICD-10-CM | POA: Diagnosis not present

## 2016-12-15 DIAGNOSIS — I639 Cerebral infarction, unspecified: Secondary | ICD-10-CM

## 2016-12-15 DIAGNOSIS — E119 Type 2 diabetes mellitus without complications: Secondary | ICD-10-CM | POA: Diagnosis not present

## 2016-12-15 DIAGNOSIS — Z794 Long term (current) use of insulin: Secondary | ICD-10-CM | POA: Diagnosis not present

## 2016-12-15 DIAGNOSIS — K219 Gastro-esophageal reflux disease without esophagitis: Secondary | ICD-10-CM | POA: Diagnosis not present

## 2016-12-15 LAB — URINALYSIS, ROUTINE W REFLEX MICROSCOPIC
Bilirubin Urine: NEGATIVE
Glucose, UA: NEGATIVE mg/dL
HGB URINE DIPSTICK: NEGATIVE
Ketones, ur: NEGATIVE mg/dL
Nitrite: NEGATIVE
PROTEIN: NEGATIVE mg/dL
SQUAMOUS EPITHELIAL / LPF: NONE SEEN
Specific Gravity, Urine: 1.014 (ref 1.005–1.030)
pH: 5 (ref 5.0–8.0)

## 2016-12-15 LAB — LIPID PANEL
Cholesterol: 214 mg/dL — ABNORMAL HIGH (ref 0–200)
HDL: 90 mg/dL (ref >40)
LDL Cholesterol: 104 mg/dL — ABNORMAL HIGH (ref 0–99)
TRIGLYCERIDES: 102 mg/dL (ref <150)
Total CHOL/HDL Ratio: 2.4 RATIO
VLDL: 20 mg/dL (ref 0–40)

## 2016-12-15 LAB — GLUCOSE, CAPILLARY
GLUCOSE-CAPILLARY: 173 mg/dL — AB (ref 65–99)
GLUCOSE-CAPILLARY: 311 mg/dL — AB (ref 65–99)

## 2016-12-15 LAB — ECHOCARDIOGRAM COMPLETE
HEIGHTINCHES: 64 in
WEIGHTICAEL: 3763.69 [oz_av]

## 2016-12-15 MED ORDER — CLOPIDOGREL BISULFATE 75 MG PO TABS
75.0000 mg | ORAL_TABLET | Freq: Every day | ORAL | Status: DC
Start: 2016-12-15 — End: 2016-12-15
  Administered 2016-12-15: 75 mg via ORAL
  Filled 2016-12-15: qty 1

## 2016-12-15 MED ORDER — CLOPIDOGREL BISULFATE 75 MG PO TABS: 75.0000 mg | ORAL_TABLET | Freq: Every day | ORAL | 4 refills | Status: DC

## 2016-12-15 MED ORDER — PERFLUTREN LIPID MICROSPHERE
1.0000 mL | INTRAVENOUS | Status: DC | PRN
  Administered 2016-12-15: 3 mL via INTRAVENOUS
  Filled 2016-12-15: qty 10

## 2016-12-15 NOTE — Evaluation (Signed)
Physical Therapy Evaluation/Discharge Patient Details Name: Monica Pearson MRN: [id number] DOB: 03/16/1946 Today's Date: 12/15/2016   History of Present Illness  71 y.o. female admitted to [facility] on 12/15/15 for right sided weakness.  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  Pt with significant PMHx of sarcoidosis, HTN, DM, and R TKA.    Clinical Impression  One time assessment completed and pt has good strength, sensation, coordination in all 4 extremities.  Her gait and balance are WNL (DGI 24/24).  PT to sign off as she has returned to her baseline.      Follow Up Recommendations No PT follow up    Equipment Recommendations  None recommended by PT    Recommendations for Other Services   NA    Precautions / Restrictions Precautions Precautions: None      Mobility  Bed Mobility               General bed mobility comments: Pt OOB in chair  Transfers Overall transfer level: Modified independent               General transfer comment: uses hands for transitions  Ambulation/Gait Ambulation/Gait assistance: Independent Ambulation Distance (Feet): 400 Feet Assistive device: None Gait Pattern/deviations: WFL(Within Functional Limits)   Gait velocity interpretation: at or above normal speed for age/gender        Modified Rankin (Stroke Patients Only) Modified Rankin (Stroke Patients Only) Pre-Morbid Rankin Score: No symptoms Modified Rankin: No symptoms     Balance Overall balance assessment: Independent                               Standardized Balance Assessment Standardized Balance Assessment : Dynamic Gait Index   Dynamic Gait Index Level Surface: Normal Change in Gait Speed: Normal Gait with Horizontal Head Turns: Normal Gait with Vertical Head Turns: Normal Gait and Pivot Turn: Normal Step Over Obstacle: Normal Step Around Obstacles: Normal Steps: Normal Total Score: 24       Pertinent Vitals/Pain Pain Assessment: No/denies pain     Home Living Family/patient expects to be discharged to:: Private residence Living Arrangements: Alone Available Help at Discharge: Family;Available PRN/intermittently Type of Home: House Home Access: Stairs to enter     Home Layout: One level Home Equipment: None      Prior Function Level of Independence: Independent         Comments: retired     Journalist, newspaper   Dominant Hand: Right    Extremity/Trunk Assessment   Upper Extremity Assessment Upper Extremity Assessment: Overall WFL for tasks assessed (coordination and sensation intact, strength 5/5 and equal)    Lower Extremity Assessment Lower Extremity Assessment: Overall WFL for tasks assessed (coordination and sensation intact, strength 5/5 and equal)    Cervical / Trunk Assessment Cervical / Trunk Assessment: Normal  Communication   Communication: No difficulties  Cognition Arousal/Alertness: Awake/alert Behavior During Therapy: WFL for tasks assessed/performed Overall Cognitive Status: Within Functional Limits for tasks assessed                      General Comments General comments (skin integrity, edema, etc.): Educated pt and family re: BEFAST and the importance of getting to the hospital quickly when signs of a stroke occur.         Assessment/Plan    PT Assessment Patent does not need any further PT services  PT Goals (Current goals can be found in the Care Plan section)  Acute Rehab PT Goals Patient Stated Goal: to go home  PT Goal Formulation: All assessment and education complete, DC therapy        End of Session   Activity Tolerance: Patient tolerated treatment well Patient left: in chair;with call bell/phone within reach;with chair alarm set;with family/visitor present Nurse Communication: Mobility status    Functional Assessment Tool Used: assist level Functional Limitation: Mobility: Walking and moving around Mobility: Walking and Moving Around Current  Status [phone]): 0 percent impaired, limited or restricted Mobility: Walking and Moving Around Goal Status [phone]): 0 percent impaired, limited or restricted Mobility: Walking and Moving Around Discharge Status [phone]): 0 percent impaired, limited or restricted    Time: 2111-7356 PT Time Calculation (min) (ACUTE ONLY): 19 min   Charges:   PT Evaluation $PT Eval Moderate Complexity: 1 Procedure     PT G Codes:   PT G-Codes **NOT FOR INPATIENT CLASS** Functional Assessment Tool Used: assist level Functional Limitation: Mobility: Walking and moving around Mobility: Walking and Moving Around Current Status (P0141): 0 percent impaired, limited or restricted Mobility: Walking and Moving Around Goal Status (C3013): 0 percent impaired, limited or restricted Mobility: Walking and Moving Around Discharge Status (H4388): 0 percent impaired, limited or restricted    Maybell Misenheimer B. [place], [place], DPT [phone]   12/15/2016, 11:33 AM

## 2016-12-15 NOTE — Consult Note (Signed)
Neurology Consultation Reason for Consult: Transient right-sided weakness Referring Physician: Roel Cluck, A  CC: Transient right-sided weakness  History is obtained from: Patient  HPI: Monica Pearson is a 71 y.o. female who is in her normal state of health earlier today when she was working on some jewelry and noticed that she kept dropping things in her right hand. She did not notice any slurred speech or facial weakness. She did notice of it later in the shower that her right leg felt heavy as well. The symptoms did not last long, and are completely resolved at this time.   LKW: 11 AM tpa given?: no, outside of window    ROS: A 14 point ROS was performed and is negative except as noted in the HPI.   Past Medical History:  Diagnosis Date  . Diabetes mellitus without complication ([place])   . GERD (gastroesophageal reflux disease)   . Hypertension   . Sarcoidosis ([place])      Family History  Problem Relation Age of Onset  . Stroke Mother   . Bladder Cancer Mother   . CAD Father   . Prostate cancer Father      Social History:  reports that she has quit smoking. She has never used smokeless tobacco. She reports that she does not drink alcohol or use drugs.   Exam: Current vital signs: BP 140/75 (BP Location: Right Arm)   Pulse 71   Temp 98.5 F (36.9 C) (Oral)   Resp 16   Ht 5' 4"  (1.626 m)   Wt 106.7 kg (235 lb 3.7 oz)   LMP  (Approximate)   SpO2 97%   BMI 40.38 kg/m  Vital signs in last 24 hours: Temp:  [97.7 F (36.5 C)-98.6 F (37 C)] 98.5 F (36.9 C) (01/21 0100) Pulse Rate:  [66-76] 71 (01/21 0100) Resp:  [12-20] 16 (01/21 0100) BP: (116-178)/(45-93) 140/75 (01/21 0100) SpO2:  [95 %-100 %] 97 % (01/21 0100) FiO2 (%):  [21 %] 21 % (01/20 2126) Weight:  [106.4 kg (234 lb 8 oz)-106.7 kg (235 lb 3.7 oz)] 106.7 kg (235 lb 3.7 oz) (01/20 2050)   Physical Exam  Constitutional: Appears well-developed and well-nourished.  Psych: Affect appropriate to  situation Eyes: No scleral injection HENT: No OP obstrucion Head: Normocephalic.  Cardiovascular: Normal rate and regular rhythm.  Respiratory: Effort normal and breath sounds normal to anterior ascultation GI: Soft.  No distension. There is no tenderness.  Skin: WDI  Neuro: Mental Status: Patient is awake, alert, oriented to person, place, month, year, and situation. Patient is able to give a clear and coherent history. No signs of aphasia or neglect Cranial Nerves: II: Visual Fields are full. Pupils are equal, round, and reactive to light.   III,IV, VI: EOMI without ptosis or diploplia.  V: Facial sensation is symmetric to temperature VII: Facial movement is symmetric.  VIII: hearing is intact to voice X: Uvula elevates symmetrically XI: Shoulder shrug is symmetric. XII: tongue is midline without atrophy or fasciculations.  Motor: Tone is normal. Bulk is normal. 5/5 strength was present in all four extremities.  Sensory: Sensation is symmetric to light touch and temperature in the arms and legs. Cerebellar: FNF and HKS are intact bilaterally   I have reviewed labs in epic and the results pertinent to this consultation are: cmp - elevated glucose  I have reviewed the images obtained:CT head - unremarkable  Impression: 71 year old female presenting with transient right arm and leg weakness which is now resolved. The story is  very concerning for TIA and she is being evaluated for such.  Recommendations: 1. HgbA1c, fasting lipid panel 2. MRI, MRA  of the brain without contrast 3. Frequent neuro checks 4. Echocardiogram 5. Carotid dopplers 6. Prophylactic therapy-Antiplatelet med: Aspirin - dose 356m PO or 3026mPR 7. Risk factor modification 8. Telemetry monitoring 9. PT consult, OT consult, Speech consult 10. please page stroke NP  Or  PA  Or MD  from 8am -4 pm starting 1/21 as this patient will be followed by the stroke team at this point.   You can look them up on  [email or web address]     McRoland RackMD [facility] [phone] 7pm- 7am, please page neurology on call as listed in [place]

## 2016-12-15 NOTE — Progress Notes (Signed)
STROKE TEAM PROGRESS NOTE   HISTORY OF PRESENT ILLNESS (per record) Monica Pearson is a 71 y.o. female who is in her normal state of health earlier today when she was working on some jewelry and noticed that she kept dropping things in her right hand. She did not notice any slurred speech or facial weakness. She did notice of it later in the shower that her right leg felt heavy as well. The symptoms did not last long, and are completely resolved at this time.   LKW: 11 AM tpa given?: no, outside of window   SUBJECTIVE (INTERVAL HISTORY) The patient's son was at the bedside. The patient feels she is back to baseline. Dr. Leonie Man discussed the results of the MRI as well as stroke risk factors.   OBJECTIVE Temp:  [97.6 F (36.4 C)-98.6 F (37 C)] 97.6 F (36.4 C) (01/21 1010) Pulse Rate:  [65-76] 71 (01/21 1010) Cardiac Rhythm: Normal sinus rhythm (01/21 0900) Resp:  [12-20] 18 (01/21 1010) BP: (116-178)/(45-93) 144/67 (01/21 1010) SpO2:  [95 %-100 %] 95 % (01/21 1010) FiO2 (%):  [21 %] 21 % (01/20 2126) Weight:  [106.4 kg (234 lb 8 oz)-106.7 kg (235 lb 3.7 oz)] 106.7 kg (235 lb 3.7 oz) (01/20 2050)  CBC:   Recent Labs Lab 12/14/16 1625  WBC 6.1  NEUTROABS 2.4  HGB 12.0  HCT 34.1*  MCV 78.0  PLT 767    Basic Metabolic Panel:   Recent Labs Lab 12/14/16 1625  NA 137  K 4.4  CL 100*  CO2 28  GLUCOSE 180*  BUN 21*  CREATININE 0.76  CALCIUM 9.7    Lipid Panel:     Component Value Date/Time   CHOL 214 (H) 12/15/2016 0203   TRIG 102 12/15/2016 0203   HDL 90 12/15/2016 0203   CHOLHDL 2.4 12/15/2016 0203   VLDL 20 12/15/2016 0203   LDLCALC 104 (H) 12/15/2016 0203   HgbA1c: No results found for: HGBA1C Urine Drug Screen: No results found for: LABOPIA, COCAINSCRNUR, LABBENZ, AMPHETMU, THCU, Lane  Ct Head Code Stroke W/o Cm 12/14/2016 1. Unremarkable CT appearance of the brain for age.  2. ASPECTS is 10.    MRI / MRA Head 12/15/2016 9 mm acute  infarction in the left side of the pons. Otherwise normal appearance of the brain for a person of this age. No major vessel occlusion. Some atherosclerotic narrowing of the medium-sized intracranial branch vessels. 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    PHYSICAL EXAM Pleasant elderly lady not in distress. . Afebrile. Head is nontraumatic. Neck is supple without bruit.    Cardiac exam no murmur or gallop. Lungs are clear to auscultation. Distal pulses are well felt.  Neurological Exam ;  Awake  Alert oriented x 3. Normal speech and language.eye movements full without nystagmus.fundi were not visualized. Vision acuity and fields appear normal. Hearing is normal. Palatal movements are normal. Face symmetric. Tongue midline. Normal strength, tone, reflexes and coordination. Normal sensation. Gait deferred.    ASSESSMENT/PLAN Monica Pearson is a 71 y.o. female with history of sarcoidosis, hypertension, diabetes mellitus, and gastroesophageal reflux disease presenting with . She did not receive IV t-PA due to late presentation.  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  Resultant  No deficts  MRI - 9 mm acute infarction in the left side of the pons.  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  Carotid Doppler - Bilateral 1-39% ICA stenosis, antegrade vertebral flow.  2D Echo - EF 60-65%. No cardiac source of emboli identified.  LDL - 104  HgbA1c pending  VTE prophylaxis - SCDs Diet heart healthy/carb modified Room service appropriate? Yes; Fluid consistency: Thin  aspirin 81 mg daily prior to admission, now on clopidogrel 75 mg daily  Patient counseled to be compliant with her antithrombotic medications  Ongoing aggressive stroke risk factor management  Therapy recommendations:  No follow-up therapies recommended.  Disposition:  Possible discharge later  today.  Hypertension  Stable  Permissive hypertension (OK if < 220/120) but gradually normalize in 5-7 days  Long-term BP goal normotensive  Hyperlipidemia  Home meds: Zetia 10 mg daily resumed in hospital  LDL 104, goal < 70  Add Lipitor 20 mg daily  Continue statin at discharge  Diabetes  HgbA1c pending, goal < 7.0  Uncontrolled  Other Stroke Risk Factors  Advanced age  The patient reports that she quit smoking cigarettes.  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 Leonie Man 2 months   Hospital day # 0  Mikey Bussing PA-C [facility] Pager [phone] 12/15/2016, 1:31 PM  I have personally examined this patient, reviewed notes, independently viewed imaging studies, participated in medical decision making and plan of care.ROS completed by me personally and pertinent positives fully documented  I have made any additions or clarifications directly to the above note. Agree with note above. She presented with transient right sided weakness from a small lacunar left pontine infarct from small vessel disease. Recommend change aspirin and to Plavix for secondary stroke prevention and aggressive risk factor modification. Conservative followup for small brain unruptured aneurysms  for now.long discussion with with patient and son and answered questions. Greater than 50% time during this 35 minute visit was spent on counseling and coordination of care about stroke, brain aneurysms, risk, prevention and treatment discussion. D/W Dr Vernell Morgans, MD Medical Director [place] Pager: [phone] 12/15/2016 2:35 PM  To contact Stroke Continuity provider, please refer to  [email or web address] After hours, contact General Neurology

## 2016-12-15 NOTE — Progress Notes (Signed)
SLP Cancellation Note  Patient Details Name: Monica Pearson MRN: [id number] DOB: 1946-10-12   Cancelled treatment:       Reason Eval/Treat Not Completed: SLP screened, no needs identified, will sign off   Juan Quam Laurice 12/15/2016, 12:02 PM

## 2016-12-15 NOTE — Progress Notes (Signed)
OT Cancellation Note  Patient Details Name: Monica Pearson MRN: [id number] DOB: 22-Dec-1945   Cancelled Treatment:    Reason Eval/Treat Not Completed: OT screened, no needs identified, will sign off. Pt walking with PT and doing wonderfully, PT reports no OT needs noted.  Almon Register [phone] 12/15/2016, 11:21 AM

## 2016-12-15 NOTE — Discharge Summary (Signed)
Physician Discharge Summary   Patient ID: Monica Pearson MRN: [id number] DOB/AGE: January 17, 1946 71 y.o.  Admit date: 12/14/2016 Discharge date: 12/15/2016  Primary Care Physician:  Maylon Peppers, MD  Discharge Diagnoses:    . Acute CVA . GERD (gastroesophageal reflux disease) .  Hypertension    Hyperlipidemia   Diabetes mellitus   Consults:  Neurology  Recommendations for Outpatient Follow-up:  1. Patient is started on Plavix 75 mg daily, aspirin discontinued 2. Please repeat CBC/BMET at next visit   DIET: Carb modified diet    Allergies:   Allergies  Allergen Reactions  . Celecoxib Swelling    Ankles swell  . Rofecoxib Swelling    Ankles swell  . Darvon [Propoxyphene] Nausea And Vomiting  . Hydrocodone-Acetaminophen Nausea And Vomiting  . Morphine Nausea And Vomiting     DISCHARGE MEDICATIONS: Current Discharge Medication List    START taking these medications   Details  clopidogrel (PLAVIX) 75 MG tablet Take 1 tablet (75 mg total) by mouth daily. Qty: 30 tablet, Refills: 4      CONTINUE these medications which have NOT CHANGED   Details  atenolol (TENORMIN) 50 MG tablet Take 50 mg by mouth daily.    benazepril (LOTENSIN) 40 MG tablet Take 40 mg by mouth daily.    Calcium-Magnesium-Vitamin D (CALCIUM 1200+D3 PO) Take 2 tablets by mouth at bedtime.    chlorthalidone (HYGROTON) 25 MG tablet Take 25 mg by mouth daily.    ezetimibe (ZETIA) 10 MG tablet Take 10 mg by mouth every morning.    glipiZIDE (GLUCOTROL XL) 10 MG 24 hr tablet Take 10 mg by mouth 2 (two) times daily.    Insulin Glargine (LANTUS SOLOSTAR) 100 UNIT/ML Solostar Pen Inject 35 Units into the skin at bedtime. MAX 50 UNITS/DAY    insulin lispro (HUMALOG KWIKPEN) 100 UNIT/ML KiwkPen Inject 15 Units into the skin 3 (three) times daily with meals. MAX OF 50 UNITS/DAY    liraglutide (VICTOZA) 18 MG/3ML SOPN Inject 1.8 mg into the skin at bedtime.    Magnesium 500 MG CAPS Take 500 mg by mouth at  bedtime.    metFORMIN (GLUCOPHAGE) 500 MG tablet Take 500 mg by mouth 2 (two) times daily with a meal.    Omega-3 Fatty Acids (KP OMEGA-3 FISH OIL) 1200 MG CAPS Take 1 capsule by mouth daily.    omeprazole (PRILOSEC) 20 MG capsule Take 20 mg by mouth daily.    Oregano 1500 MG CAPS Take 1,500 mg by mouth 2 (two) times daily.    Propylene Glycol (SYSTANE BALANCE OP) Place 1-2 drops into both eyes 3 (three) times daily as needed (for dry eyes).    ondansetron (ZOFRAN ODT) 8 MG disintegrating tablet 3m ODT q4 hours prn nausea Qty: 6 tablet, Refills: 0      STOP taking these medications     aspirin EC 81 MG tablet      ciprofloxacin (CIPRO) 500 MG tablet          Brief H and P: For complete details please refer to admission H and P, but in brief Patient is a 71year old female with diabetes mellitus, hypertension, hyperlipidemia, sarcoidosis, GERD presented with progressive dizziness, slurred speech and right-handed weakness that started around 11 AM. Per patient she was in her normal state of health earlier on the morning of admission when she was working on some jewelry and noticed that she kept dropping things in her right hand. She then noticed that later in the shower that her right leg  also felt heavy. Her symptoms were completely resolved in the ER. She was not considered TPA candidate due to outside of the window. Patient was admitted for further workup for concern of stroke.   Hospital Course:     Acute CVA (cerebrovascular accident) [facility]) - Patient presented with dizziness, slurred speech, right-sided weakness with a principal diagnosis of possible acute CVA/TIA. Symptoms had resolved at the time of presentation in ED. MRI showed 9 limited acute infarction in the left side of the pons otherwise normal appearance of the brain. MRA showed no major occlusion, some atherosclerotic narrowing of the medium sized intracranial branch vessels. - Carotid Doppler showed 1-39% ICA stenosis  bilaterally - 2-D echo showed EF of 60-65% with grade 1 SR dysfunction - Neurology was consulted, recommended Plavix 75 mg daily - Lipid panel showed LDL 104. Patient reports that she is intolerant to statin as she has transaminitis with prior use of statins given her history of sarcoidosis. She was continued on zetia - PT, OT evaluation showed no PT follow-up needed, patient was ambulating without any difficulty. Outpatient follow-up with neurology recommended    GERD (gastroesophageal reflux disease) - Continue PPI    Diabetes mellitus type II, insulin-dependent (HCC)  - Continue Lantus, sliding scale insulin  Hypertension - Continue Tenormin, benazepril   Day of Discharge BP (!) 144/67 (BP Location: Left Arm)   Pulse 71   Temp 97.6 F (36.4 C) (Oral)   Resp 18   Ht 5' 4"  (1.626 m)   Wt 106.7 kg (235 lb 3.7 oz)   LMP  (Approximate)   SpO2 95%   BMI 40.38 kg/m   Physical Exam: General: Alert and awake oriented x3 not in any acute distress. HEENT: anicteric sclera, pupils reactive to light and accommodation CVS: S1-S2 clear no murmur rubs or gallops Chest: clear to auscultation bilaterally, no wheezing rales or rhonchi Abdomen: soft nontender, nondistended, normal bowel sounds Extremities: no cyanosis, clubbing or edema noted bilaterally Neuro: Cranial nerves II-XII intact, no focal neurological deficits   The results of significant diagnostics from this hospitalization (including imaging, microbiology, ancillary and laboratory) are listed below for reference.    LAB RESULTS: Basic Metabolic Panel:  Recent Labs Lab 12/14/16 1625  NA 137  K 4.4  CL 100*  CO2 28  GLUCOSE 180*  BUN 21*  CREATININE 0.76  CALCIUM 9.7   Liver Function Tests:  Recent Labs Lab 12/14/16 1625  AST 44*  ALT 66*  ALKPHOS 101  BILITOT 0.4  PROT 7.6  ALBUMIN 3.8   No results for input(s): LIPASE, AMYLASE in the last 168 hours. No results for input(s): AMMONIA in the last 168  hours. CBC:  Recent Labs Lab 12/14/16 1625  WBC 6.1  NEUTROABS 2.4  HGB 12.0  HCT 34.1*  MCV 78.0  PLT 266   Cardiac Enzymes:  Recent Labs Lab 12/14/16 1625  TROPONINI <0.03   BNP: Invalid input(s): POCBNP CBG:  Recent Labs Lab 12/15/16 0623 12/15/16 1153  GLUCAP 173* 311*    Significant Diagnostic Studies:  Mr Brain Wo Contrast  Result Date: 12/15/2016 CLINICAL DATA:  History of diabetes, hypertension and sarcoid. Progressive dizziness. Slurred speech. Right hand weakness beginning yesterday. EXAM: MRI HEAD WITHOUT CONTRAST MRA HEAD WITHOUT CONTRAST TECHNIQUE: Multiplanar, multiecho pulse sequences of the brain and surrounding structures were obtained without intravenous contrast. Angiographic images of the head were obtained using MRA technique without contrast. COMPARISON:  None. Head CT 12/14/2016.  MRI 12/27/2013. FINDINGS: MRI HEAD FINDINGS Brain: Diffusion  imaging shows an acute 9 mm infarction in the left side of the pons. No other acute infarction. The cerebellum is normal. Cerebral hemispheres are normal without evidence of atrophy or significant small-vessel change. No mass lesion, hemorrhage, hydrocephalus or extra-axial collection. Vascular: Major vessels at the base of the brain show flow. Skull and upper cervical spine: Negative Sinuses/Orbits: Clear/normal Other: None MRA HEAD FINDINGS Both internal carotid arteries are patent into the brain. The anterior and middle cerebral vessels are patent without proximal stenosis, aneurysm or vascular malformation. More distal branch vessels do show some atherosclerotic irregularity. There is a 4 mm aneurysm projecting medially from the anterior carotid siphon on the right. There may be a 2 mm aneurysm projecting medially from the anterior carotid siphon on the left. Both vertebral arteries are widely patent to the basilar with the left being dominant. Posterior inferior cerebellar arteries are patent bilaterally. No basilar  stenosis. Superior cerebellar arteries and posterior cerebral arteries are patent bilaterally, with a moderate stenosis of the left PCA 1.5 cm beyond its origin. IMPRESSION: 9 mm acute infarction in the left side of the pons. Otherwise normal appearance of the brain for a person of this age. No major vessel occlusion. Some atherosclerotic narrowing of the medium-sized intracranial branch vessels. 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 Electronically Signed   By: Nelson Chimes M.D.   On: 12/15/2016 08:57   Mr Jodene Nam Head/brain ZJ Cm  Result Date: 12/15/2016 CLINICAL DATA:  History of diabetes, hypertension and sarcoid. Progressive dizziness. Slurred speech. Right hand weakness beginning yesterday. EXAM: MRI HEAD WITHOUT CONTRAST MRA HEAD WITHOUT CONTRAST TECHNIQUE: Multiplanar, multiecho pulse sequences of the brain and surrounding structures were obtained without intravenous contrast. Angiographic images of the head were obtained using MRA technique without contrast. COMPARISON:  None. Head CT 12/14/2016.  MRI 12/27/2013. FINDINGS: MRI HEAD FINDINGS Brain: Diffusion imaging shows an acute 9 mm infarction in the left side of the pons. No other acute infarction. The cerebellum is normal. Cerebral hemispheres are normal without evidence of atrophy or significant small-vessel change. No mass lesion, hemorrhage, hydrocephalus or extra-axial collection. Vascular: Major vessels at the base of the brain show flow. Skull and upper cervical spine: Negative Sinuses/Orbits: Clear/normal Other: None MRA HEAD FINDINGS Both internal carotid arteries are patent into the brain. The anterior and middle cerebral vessels are patent without proximal stenosis, aneurysm or vascular malformation. More distal branch vessels do show some atherosclerotic irregularity. There is a 4 mm aneurysm projecting medially from the anterior carotid siphon on the right. There may be a 2 mm aneurysm projecting medially  from the anterior carotid siphon on the left. Both vertebral arteries are widely patent to the basilar with the left being dominant. Posterior inferior cerebellar arteries are patent bilaterally. No basilar stenosis. Superior cerebellar arteries and posterior cerebral arteries are patent bilaterally, with a moderate stenosis of the left PCA 1.5 cm beyond its origin. IMPRESSION: 9 mm acute infarction in the left side of the pons. Otherwise normal appearance of the brain for a person of this age. No major vessel occlusion. Some atherosclerotic narrowing of the medium-sized intracranial branch vessels. 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 Electronically Signed   By: Nelson Chimes M.D.   On: 12/15/2016 08:57   Ct Head Code Stroke W/o Cm  Result Date: 12/14/2016 CLINICAL DATA:  Code stroke. Progressively worsening dizziness and slurred speech today. EXAM: CT HEAD WITHOUT CONTRAST TECHNIQUE: Contiguous  axial images were obtained from the base of the skull through the vertex without intravenous contrast. COMPARISON:  12/27/2013 brain MRI FINDINGS: Brain: There is no evidence of acute cortical infarct, intracranial hemorrhage, mass, midline shift, or extra-axial fluid collection. Ventricles and sulci are normal for age. Vascular: No hyperdense vessel or unexpected calcification. Skull: No fracture or focal osseous lesion. Sinuses/Orbits: Paranasal sinuses and mastoid air cells are clear. Unremarkable orbits. Other: None. ASPECTS [facility] Stroke Program Early CT Score) - Ganglionic level infarction (caudate, lentiform nuclei, internal capsule, insula, M1-M3 cortex): 7 - Supraganglionic infarction (M4-M6 cortex): 3 Total score (0-10 with 10 being normal): 10 IMPRESSION: 1. Unremarkable CT appearance of the brain for age. 2. ASPECTS is 10. These results were called by telephone at the time of interpretation on 12/14/2016 at 4:09 pm to Dr. Threasa Beards BELFI , who verbally acknowledged these  results. Electronically Signed   By: Logan Bores M.D.   On: 12/14/2016 16:09    2D ECHO:  Study Conclusions  - Left ventricle: The cavity size was normal. Wall thickness was   increased in a pattern of mild LVH. Systolic function was normal.   The estimated ejection fraction was in the range of 60% to 65%.   Wall motion was normal; there were no regional wall motion   abnormalities. Doppler parameters are consistent with abnormal   left ventricular relaxation (grade 1 diastolic dysfunction). - Aortic valve: Mildly calcified annulus. Trileaflet. - Atrial septum: No defect or patent foramen ovale was identified. - Tricuspid valve: There was physiologic regurgitation. - Pulmonary arteries: Systolic pressure could not be accurately   estimated. - Pericardium, extracardiac: There was no pericardial effusion.  Impressions:  - Mild LVH with LVEF 60-65% and grade 1 diastolic dysfunction. Mild   aortic annular calcification. No obvious PFO or ASD.  Disposition and Follow-up: Discharge Instructions    Ambulatory referral to Neurology    Complete by:  As directed    An appointment is requested in approximately: 6 WEEKS, STROKE FOLLOW-UP   Diet Carb Modified    Complete by:  As directed    Increase activity slowly    Complete by:  As directed        DISPOSITION: home    DISCHARGE FOLLOW-UP Follow-up Information    Maylon Peppers, MD. Schedule an appointment as soon as possible for a visit in 2 week(s).   Specialty:  Family Medicine Contact information: [address] [place] [address] [phone]        SETHI,PRAMOD, MD. Schedule an appointment as soon as possible for a visit in 6 week(s).   Specialties:  Neurology, Radiology Contact information: [address] [place] [address] [phone]            Time spent on Discharge: 22mns   Signed:   Jadier Rockers M.D. [facility] 12/15/2016, 1:45  PM Pager: [phone]

## 2016-12-15 NOTE — Progress Notes (Signed)
Understands d/c instr. Has Rx for plavix. Home now via wc to car of family member.

## 2016-12-15 NOTE — Progress Notes (Signed)
  Echocardiogram 2D Echocardiogram has been performed.  Darlina Sicilian M 12/15/2016, 9:52 AM

## 2016-12-15 NOTE — Progress Notes (Signed)
*  PRELIMINARY RESULTS* Vascular Ultrasound Carotid Duplex (Doppler) has been completed.  Preliminary findings: Bilateral 1-39% ICA stenosis, antegrade vertebral flow.   Everrett Coombe 12/15/2016, 9:19 AM

## 2016-12-15 NOTE — Progress Notes (Signed)
Q 2 hr neuro checks and vitals initiated upon patients arrival to unit and will be complete at 0900. RN reviewed and discussed patients current treatment plan and provided education on HTN, DM and cholesterol. Patient v/u. RN notified pharmacy of patient need for stroke mapping booklet and report provided to oncoming RN.

## 2016-12-15 NOTE — Progress Notes (Signed)
OT Cancellation Note  Patient Details Name: Monica Pearson MRN: [id number] DOB: 1946-06-17   Cancelled Treatment:    Reason Eval/Treat Not Completed: Patient at procedure or test/ unavailable. Pt currently in MRI--will try back later as schedule permits for evaluation.  Almon Register [phone] 12/15/2016, 7:42 AM

## 2016-12-16 LAB — VAS US CAROTID
LCCADDIAS: -17 cm/s
LEFT ECA DIAS: -12 cm/s
LEFT VERTEBRAL DIAS: -18 cm/s
LICADDIAS: -28 cm/s
Left CCA dist sys: -73 cm/s
Left CCA prox dias: 24 cm/s
Left CCA prox sys: 101 cm/s
Left ICA dist sys: -92 cm/s
Left ICA prox dias: -28 cm/s
Left ICA prox sys: -115 cm/s
RCCADSYS: -137 cm/s
RCCAPDIAS: -20 cm/s
RIGHT CCA MID DIAS: 20 cm/s
RIGHT ECA DIAS: -16 cm/s
RIGHT VERTEBRAL DIAS: 11 cm/s
Right CCA prox sys: -137 cm/s

## 2016-12-16 LAB — HEMOGLOBIN A1C
Hgb A1c MFr Bld: 8.3 % — ABNORMAL HIGH (ref 4.8–5.6)
Mean Plasma Glucose: 192 mg/dL

## 2017-01-22 ENCOUNTER — Encounter: Payer: Self-pay | Admitting: Neurology

## 2017-01-22 ENCOUNTER — Ambulatory Visit (INDEPENDENT_AMBULATORY_CARE_PROVIDER_SITE_OTHER): Payer: Medicare Other | Admitting: Neurology

## 2017-01-22 VITALS — BP 140/80 | HR 76 | Ht 64.0 in | Wt 233.6 lb

## 2017-01-22 DIAGNOSIS — I639 Cerebral infarction, unspecified: Secondary | ICD-10-CM | POA: Diagnosis not present

## 2017-01-22 DIAGNOSIS — I6381 Other cerebral infarction due to occlusion or stenosis of small artery: Secondary | ICD-10-CM

## 2017-01-22 NOTE — Progress Notes (Signed)
[facility] [address] [place]. [place] [address] [phone]       OFFICE FOLLOW-UP NOTE  Monica. Monica Pearson Date of Birth:  Jan 18, 1946 Medical Record Number:  [id number]   HPI: Monica Pearson is a 51 year pleasant lady seen today for first office follow-up visit for in-hospital admission for stroke in January 2018. History is up 10 from the patient, review of hospital notes and I have personally reviewed imaging studies.Monica Pearson a 71 y.o.femalewho is in her normal state of health earlier today when she was working on some jewelry and noticed that she kept dropping things in her right hand. She did not notice any slurred speech or facial weakness. She did notice of it later in the shower that her right leg felt heavy as well. The symptoms did not last long, and are completely resolved at this time. LKW: 11 AM on 12/14/16. tpa given?: no, outside of window. 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 Carotid Doppler showed no significant extracranial stenosis. Transthoracic echo showed normal ejection fraction. LDL cholesterol was elevated at 104 mg percent. Hemoglobin A1c was elevated at 8.3. Patient was previously on aspirin and this was switched to Plavix for stroke prevention. She was started on Lipitor but patient has statin intolerance and has subsequently been switched to Zetia which is tolerating well. She states she's made a full recovery and has no residual symptoms. She states her fasting sugars are much better and on in the 120s range. She is tolerating Plavix without bleeding or bruising. She also has sleep apnea and uses a CPAP and she states she is not sleeping well at night probably related related to her diabetes and polyuria. She has started eating healthy but has not yet lost weight.  ROS:   14 system  review of systems is positive for headache, restless leg, sleep difficulties only and all other systems negative  PMH:  Past Medical History:  Diagnosis Date  . Diabetes mellitus without complication ([place])   . GERD (gastroesophageal reflux disease)   . Hypertension   . Sarcoidosis ([place])   . Stroke [facility])     Social History:  Social History   Social History  . Marital status: Unknown    Spouse name: N/A  . Number of children: N/A  . Years of education: N/A   Occupational History  . Not on file.   Social History Main Topics  . Smoking status: Former Research scientist (life sciences)  . Smokeless tobacco: Never Used  . Alcohol use No  . Drug use: No  . Sexual activity: Not on file   Other Topics Concern  . Not on file   Social History Narrative  . No narrative on file    Medications:   Current Outpatient Prescriptions on File Prior to Visit  Medication Sig Dispense Refill  . atenolol (TENORMIN) 50 MG tablet Take 50 mg by mouth daily.    . benazepril (LOTENSIN) 40 MG tablet Take 40 mg by mouth daily.    . Calcium-Magnesium-Vitamin D (CALCIUM 1200+D3 PO) Take 2 tablets by mouth at bedtime.    . chlorthalidone (HYGROTON) 25 MG tablet Take 25 mg by mouth daily.    . clopidogrel (PLAVIX) 75 MG tablet Take 1 tablet (75 mg total) by mouth daily. 30 tablet 4  . ezetimibe (ZETIA) 10 MG  tablet Take 10 mg by mouth every morning.    Marland Kitchen glipiZIDE (GLUCOTROL XL) 10 MG 24 hr tablet Take 10 mg by mouth 2 (two) times daily.    . Insulin Glargine (LANTUS SOLOSTAR) 100 UNIT/ML Solostar Pen Inject 35 Units into the skin at bedtime. MAX 50 UNITS/DAY    . insulin lispro (HUMALOG KWIKPEN) 100 UNIT/ML KiwkPen Inject 15 Units into the skin 3 (three) times daily with meals. MAX OF 50 UNITS/DAY     . liraglutide (VICTOZA) 18 MG/3ML SOPN Inject 1.8 mg into the skin at bedtime.    . Magnesium 500 MG CAPS Take 500 mg by mouth at bedtime.    . metFORMIN (GLUCOPHAGE) 500 MG tablet Take 500 mg by mouth 2 (two) times daily with a  meal.    . ondansetron (ZOFRAN ODT) 8 MG disintegrating tablet 68m ODT q4 hours prn nausea 6 tablet 0  . Oregano 1500 MG CAPS Take 1,500 mg by mouth 2 (two) times daily.    .Marland KitchenPropylene Glycol (SYSTANE BALANCE OP) Place 1-2 drops into both eyes 3 (three) times daily as needed (for dry eyes).     No current facility-administered medications on file prior to visit.     Allergies:   Allergies  Allergen Reactions  . Celecoxib Swelling    Ankles swell  . Rofecoxib Swelling    Ankles swell  . Darvon [Propoxyphene] Nausea And Vomiting  . Hydrocodone-Acetaminophen Nausea And Vomiting  . Morphine Nausea And Vomiting    Physical Exam General: obese elderly lady, seated, in no evident distress Head: head normocephalic and atraumatic.  Neck: supple with no carotid or supraclavicular bruits Cardiovascular: regular rate and rhythm, no murmurs Musculoskeletal: no deformity Skin:  no rash/petichiae Vascular:  Normal pulses all extremities Vitals:   01/22/17 0913  BP: 140/80  Pulse: 76   Neurologic Exam Mental Status: Awake and fully alert. Oriented to place and time. Recent and remote memory intact. Attention span, concentration and fund of knowledge appropriate. Mood and affect appropriate.  Cranial Nerves: Fundoscopic exam reveals sharp disc margins. Pupils equal, briskly reactive to light. Extraocular movements full without nystagmus. Visual fields full to confrontation. Hearing intact. Facial sensation intact. Face, tongue, palate moves normally and symmetrically.  Motor: Normal bulk and tone. Normal strength in all tested extremity muscles. Sensory.: intact to touch ,pinprick .position and vibratory sensation.  Coordination: Rapid alternating movements normal in all extremities. Finger-to-nose and heel-to-shin performed accurately bilaterally. Gait and Station: Arises from chair without difficulty. Stance is normal. Gait demonstrates normal stride length and balance . Able to heel, toe and  tandem walk with slight difficulty.  Reflexes: 1+ and symmetric. Toes downgoing.   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 Multiple vascular risk factors of hypertension, hyperlipidemia, diabetes, obesity and sleep apnea.   PLAN: I had a long d/w patient about her recent lacunar stroke, risk for recurrent stroke/TIAs, personally independently reviewed imaging studies and stroke evaluation results and answered questions.Continue Plavix  for secondary stroke prevention and maintain strict control of hypertension with blood pressure goal below 130/90, diabetes with hemoglobin A1c goal below 6.5% and lipids with LDL cholesterol goal below 70 mg/dL. I also advised the patient to eat a healthy diet with plenty of whole grains, cereals, fruits and vegetables, exercise regularly and maintain ideal body weight .Continue CPAP at night for her sleep apnea Followup in the future with my  nurse practitioner in 6 months or call earlier if necessary. Greater than 50% of time during this 30 minute visit was spent on counseling,explanation of diagnosis, planning of further management, discussion with patient and family and coordination of care Antony Contras, MD  [facility] [address] [place], [place] [address]  Phone [phone] Fax [phone] Note: This document was prepared with digital dictation and possible smart phrase technology. Any transcriptional errors that result from this process are unintentional

## 2017-01-22 NOTE — Patient Instructions (Signed)
I had a long d/w patient about her recent lacunar stroke, risk for recurrent stroke/TIAs, personally independently reviewed imaging studies and stroke evaluation results and answered questions.Continue Plavix  for secondary stroke prevention and maintain strict control of hypertension with blood pressure goal below 130/90, diabetes with hemoglobin A1c goal below 6.5% and lipids with LDL cholesterol goal below 70 mg/dL. I also advised the patient to eat a healthy diet with plenty of whole grains, cereals, fruits and vegetables, exercise regularly and maintain ideal body weight Followup in the future with my nurse practitioner in 6 months or call earlier if necessary. Stroke Prevention Some medical conditions and behaviors are associated with an increased chance of having a stroke. You may prevent a stroke by making healthy choices and managing medical conditions. How can I reduce my risk of having a stroke?  Stay physically active. Get at least 30 minutes of activity on most or all days.  Do not smoke. It may also be helpful to avoid exposure to secondhand smoke.  Limit alcohol use. Moderate alcohol use is considered to be:  No more than 2 drinks per day for men.  No more than 1 drink per day for nonpregnant women.  Eat healthy foods. This involves:  Eating 5 or more servings of fruits and vegetables a day.  Making dietary changes that address high blood pressure (hypertension), high cholesterol, diabetes, or obesity.  Manage your cholesterol levels.  Making food choices that are high in fiber and low in saturated fat, trans fat, and cholesterol may control cholesterol levels.  Take any prescribed medicines to control cholesterol as directed by your health care provider.  Manage your diabetes.  Controlling your carbohydrate and sugar intake is recommended to manage diabetes.  Take any prescribed medicines to control diabetes as directed by your health care provider.  Control your  hypertension.  Making food choices that are low in salt (sodium), saturated fat, trans fat, and cholesterol is recommended to manage hypertension.  Ask your health care provider if you need treatment to lower your blood pressure. Take any prescribed medicines to control hypertension as directed by your health care provider.  If you are 61-28 years of age, have your blood pressure checked every 3-5 years. If you are 57 years of age or older, have your blood pressure checked every year.  Maintain a healthy weight.  Reducing calorie intake and making food choices that are low in sodium, saturated fat, trans fat, and cholesterol are recommended to manage weight.  Stop drug abuse.  Avoid taking birth control pills.  Talk to your health care provider about the risks of taking birth control pills if you are over 15 years old, smoke, get migraines, or have ever had a blood clot.  Get evaluated for sleep disorders (sleep apnea).  Talk to your health care provider about getting a sleep evaluation if you snore a lot or have excessive sleepiness.  Take medicines only as directed by your health care provider.  For some people, aspirin or blood thinners (anticoagulants) are helpful in reducing the risk of forming abnormal blood clots that can lead to stroke. If you have the irregular heart rhythm of atrial fibrillation, you should be on a blood thinner unless there is a good reason you cannot take them.  Understand all your medicine instructions.  Make sure that other conditions (such as anemia or atherosclerosis) are addressed. Get help right away if:  You have sudden weakness or numbness of the face, arm, or leg, especially  on one side of the body.  Your face or eyelid droops to one side.  You have sudden confusion.  You have trouble speaking (aphasia) or understanding.  You have sudden trouble seeing in one or both eyes.  You have sudden trouble walking.  You have dizziness.  You  have a loss of balance or coordination.  You have a sudden, severe headache with no known cause.  You have new chest pain or an irregular heartbeat. Any of these symptoms may represent a serious problem that is an emergency. Do not wait to see if the symptoms will go away. Get medical help at once. Call your local emergency services (911 in [place] Do not drive yourself to the hospital. This information is not intended to replace advice given to you by your health care provider. Make sure you discuss any questions you have with your health care provider. Document Released: 12/19/2004 Document Revised: 04/18/2016 Document Reviewed: 05/14/2013 Elsevier Interactive Patient Education  2017 [employer].

## 2017-07-21 NOTE — Progress Notes (Signed)
[facility]  PATIENT: Monica Pearson DOB: 02/09/1946   REASON FOR VISIT: Follow-up for lacunar infarct in Jan 2018  HISTORY FROM: Patient    HISTORY OF PRESENT ILLNESS:UPDATE 08/28/2018CM Monica Pearson, 71 year old female returns for follow-up for stroke which occurred in January 2018. She is currently on Plavix for secondary stroke prevention without further stroke or TIA symptoms. She has no bruising and bleeding. Blood pressure in the office today 113/69. She is Zetia  for her cholesterol she has failed statins in the past. She claims that her diabetes is in better control. Her most recent hemoglobin A1c 7.9 on 03/07/2017 through care everywhere. She continues to use her CPAP at night. She denies any falls. She does not exercise on a routine basis but is thinking about joining Silver sneakers and was encouraged to do so. Patient claims she is medically full recovery and is back to baseline. She is driving without difficulty. She returns for reevaluation   History 01/22/17 Dr,Sethi Ms Baumgarten is a 68 year pleasant lady seen today for first office follow-up visit for in-hospital admission for stroke in January 2018. History is  from the patient, review of hospital notes and I have personally reviewed imaging studies.Monica Pearson a 71 y.o.femalewho is in her normal state of health earlier today when she was working on some jewelry and noticed that she kept dropping things in her right hand.She did not notice any slurred speech or facial weakness. She did notice of it later in the shower that her right leg felt heavy as well. The symptoms did not last long, and are completely resolved at this time. LKW: 11 AM on 12/14/16. tpa given?: no, outside of window. 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 Carotid Doppler showed no  significant extracranial stenosis. Transthoracic echo showed normal ejection fraction. LDL cholesterol was elevated at 104 mg percent. Hemoglobin A1c was elevated at 8.3. Patient was previously on aspirin and this was switched to Plavix for stroke prevention. She was started on Lipitor but patient has statin intolerance and has subsequently been switched to Zetia which is tolerating well. She states she's made a full recovery and has no residual symptoms. She states her fasting sugars are much better and on in the 120s range. She is tolerating Plavix without bleeding or bruising. She also has sleep apnea and uses a CPAP and she states she is not sleeping well at night probably related related to her diabetes and polyuria. She has started eating healthy but has not yet lost weight.   REVIEW OF SYSTEMS: Full 14 system review of systems performed and notable only for those listed, all others are neg:  Constitutional: neg  Cardiovascular: neg Ear/Nose/Throat: neg  Skin: neg Eyes: neg Respiratory: neg Gastroitestinal: neg  Hematology/Lymphatic: neg  Endocrine: neg Musculoskeletal:neg Allergy/Immunology: neg Neurological: neg Psychiatric: neg Sleep : neg   ALLERGIES: Allergies  Allergen Reactions  . Celecoxib Swelling    Ankles swell  . Rofecoxib Swelling    Ankles swell  . Ciprofloxacin Itching  . Darvon [Propoxyphene] Nausea And Vomiting  . Hydrocodone-Acetaminophen Nausea And Vomiting  . Morphine Nausea And Vomiting    HOME MEDICATIONS: Outpatient Medications Prior to Visit  Medication Sig Dispense Refill  . atenolol (TENORMIN) 50 MG tablet Take 50 mg by mouth daily.    . benazepril (  LOTENSIN) 40 MG tablet Take 40 mg by mouth daily.    . Calcium-Magnesium-Vitamin D (CALCIUM 1200+D3 PO) Take 2 tablets by mouth at bedtime.    . chlorthalidone (HYGROTON) 25 MG tablet Take 25 mg by mouth daily.    . clopidogrel (PLAVIX) 75 MG tablet Take 1 tablet (75 mg total) by mouth daily. 30  tablet 4  . ezetimibe (ZETIA) 10 MG tablet Take 10 mg by mouth every morning.    Marland Kitchen glipiZIDE (GLUCOTROL XL) 10 MG 24 hr tablet Take 10 mg by mouth 2 (two) times daily.    . Insulin Glargine (LANTUS SOLOSTAR) 100 UNIT/ML Solostar Pen Inject 35 Units into the skin at bedtime. MAX 50 UNITS/DAY    . insulin lispro (HUMALOG KWIKPEN) 100 UNIT/ML KiwkPen Inject 15 Units into the skin 3 (three) times daily with meals. MAX OF 50 UNITS/DAY     . liraglutide (VICTOZA) 18 MG/3ML SOPN Inject 1.8 mg into the skin at bedtime.    . Magnesium 500 MG CAPS Take 500 mg by mouth at bedtime.    . metFORMIN (GLUCOPHAGE) 500 MG tablet Take 500 mg by mouth 2 (two) times daily with a meal.    . ondansetron (ZOFRAN ODT) 8 MG disintegrating tablet 46m ODT q4 hours prn nausea 6 tablet 0  . Propylene Glycol (SYSTANE BALANCE OP) Place 1-2 drops into both eyes 3 (three) times daily as needed (for dry eyes).    . Oregano 1500 MG CAPS Take 1,500 mg by mouth 2 (two) times daily.     No facility-administered medications prior to visit.     PAST MEDICAL HISTORY: Past Medical History:  Diagnosis Date  . [place]   . GERD (gastroesophageal reflux disease)   . Hypertension   . Sarcoidosis   . Stroke ([facility]     PAST SURGICAL HISTORY: Past Surgical History:  Procedure Laterality Date  . CHOLECYSTECTOMY    . KNEE SURGERY      FAMILY HISTORY: Family History  Problem Relation Age of Onset  . Stroke Mother   . Bladder Cancer Mother   . CAD Father   . Prostate cancer Father     SOCIAL HISTORY: Social History   Social History  . Marital status: Unknown    Spouse name: N/A  . Number of children: N/A  . Years of education: N/A   Occupational History  . Not on file.   Social History Main Topics  . Smoking status: Former SResearch scientist (life sciences) . Smokeless tobacco: Never Used  . Alcohol use No  . Drug use: No  . Sexual activity: Not on file   Other Topics Concern  . Not on file   Social  History Narrative  . No narrative on file     PHYSICAL EXAM  Vitals:   07/22/17 0848  BP: 113/69  Pulse: 65  Weight: 216 lb (98 kg)  Height: 5' 4"  (1.626 m)   Body mass index is 37.08 kg/m.  Generalized: Well developed, obese female in no acute distress  Head: normocephalic and atraumatic,. Oropharynx benign  Neck: Supple, no carotid bruits  Cardiac: Regular rate rhythm, no murmur  Musculoskeletal: No deformity   Neurological examination   Mentation: Alert oriented to time, place, history taking. Attention span and concentration appropriate. Recent and remote memory intact.  Follows all commands speech and language fluent.   Cranial nerve II-XII: Pupils were equal round reactive to light extraocular movements were full, visual field were full on confrontational test. Facial sensation  and strength were normal. hearing was intact to finger rubbing bilaterally. Uvula tongue midline. head turning and shoulder shrug were normal and symmetric.Tongue protrusion into cheek strength was normal. Motor: normal bulk and tone, full strength in the BUE, BLE, fine finger movements normal, no pronator drift. No focal weakness Sensory: normal and symmetric to light touch, in the upper and lower extremities  Coordination: finger-nose-finger, heel-to-shin bilaterally, no dysmetria Reflexes: 1+ upper lower and symmetric, plantar responses were flexor bilaterally. Gait and Station: Rising up from seated position without assistance, normal stance,  moderate stride, good arm swing, smooth turning, able to perform tiptoe, and heel walking without difficulty. Tandem gait is steady.  DIAGNOSTIC DATA (LABS, IMAGING, TESTING) - I reviewed patient records, labs, notes, testing and imaging myself where available.  Lab Results  Component Value Date   WBC 6.1 12/14/2016   HGB 12.0 12/14/2016   HCT 34.1 (L) 12/14/2016   MCV 78.0 12/14/2016   PLT 266 12/14/2016      Component Value Date/Time   NA 137  12/14/2016 1625   K 4.4 12/14/2016 1625   CL 100 (L) 12/14/2016 1625   CO2 28 12/14/2016 1625   GLUCOSE 180 (H) 12/14/2016 1625   BUN 21 (H) 12/14/2016 1625   CREATININE 0.76 12/14/2016 1625   CALCIUM 9.7 12/14/2016 1625   PROT 7.6 12/14/2016 1625   ALBUMIN 3.8 12/14/2016 1625   AST 44 (H) 12/14/2016 1625   ALT 66 (H) 12/14/2016 1625   ALKPHOS 101 12/14/2016 1625   BILITOT 0.4 12/14/2016 1625   GFRNONAA >60 12/14/2016 1625   GFRAA >60 12/14/2016 1625   Lab Results  Component Value Date   CHOL 214 (H) 12/15/2016   HDL 90 12/15/2016   LDLCALC 104 (H) 12/15/2016   TRIG 102 12/15/2016   CHOLHDL 2.4 12/15/2016   Lab Results  Component Value Date   HGBA1C 8.3 (H) 12/15/2016    ASSESSMENT AND PLAN 71 year Caucasian lady with left pontine lacunar infarct in January 2018 secondary to small vessel disease. Multiple vascular risk factors of hypertension, hyperlipidemia, diabetes, obesity and sleep apnea.   PLAN: Stressed the importance of management of risk factors to prevent further stroke Continue Plavix for secondary stroke prevention Maintain strict control of hypertension with blood pressure goal below 130/90, today's reading 113/69 continue antihypertensive medications Control of diabetes with hemoglobin A1c below 6.5 followed by primary care continue diabetic medications most recent hemoglobin A1c 7.9 on 03/07/2017 Cholesterol with LDL cholesterol less than 70, followed by primary care,  continue Zetia Exercise by walking, slowly increase , join Silver sneakers, eat healthy diet with whole grains,  fresh fruits and vegetables Continue to use CPAP at night  Discharge from stroke clinic I spent 25 minutes in total face to face time with the patient more than 50% of which was spent counseling and coordination of care, reviewing test results reviewing medications and discussing and reviewing the diagnosis of stroke and management of risk factors Dennie Bible, [facility], [facility],  APRN  [facility] [address] [place], [place] [address] [phone]

## 2017-07-22 ENCOUNTER — Encounter: Payer: Self-pay | Admitting: Nurse Practitioner

## 2017-07-22 ENCOUNTER — Ambulatory Visit (INDEPENDENT_AMBULATORY_CARE_PROVIDER_SITE_OTHER): Payer: Medicare Other | Admitting: Nurse Practitioner

## 2017-07-22 VITALS — BP 113/69 | HR 65 | Ht 64.0 in | Wt 216.0 lb

## 2017-07-22 DIAGNOSIS — E785 Hyperlipidemia, unspecified: Secondary | ICD-10-CM | POA: Insufficient documentation

## 2017-07-22 DIAGNOSIS — Z9989 Dependence on other enabling machines and devices: Secondary | ICD-10-CM | POA: Diagnosis not present

## 2017-07-22 DIAGNOSIS — G4733 Obstructive sleep apnea (adult) (pediatric): Secondary | ICD-10-CM

## 2017-07-22 DIAGNOSIS — I639 Cerebral infarction, unspecified: Secondary | ICD-10-CM

## 2017-07-22 DIAGNOSIS — I10 Essential (primary) hypertension: Secondary | ICD-10-CM | POA: Diagnosis not present

## 2017-07-22 NOTE — Patient Instructions (Addendum)
Stressed the importance of management of risk factors to prevent further stroke Continue Plavix for secondary stroke prevention Maintain strict control of hypertension with blood pressure goal below 130/90, today's reading 113/69 continue antihypertensive medications Control of diabetes with hemoglobin A1c below 6.5 followed by primary care continue diabetic medications Cholesterol with LDL cholesterol less than 70, followed by primary care,  continue Zetia Exercise by walking, slowly increase , eat healthy diet with whole grains,  fresh fruits and vegetables Continue to use CPAP at night  Discharge from stroke clinic Stroke Prevention Some medical conditions and behaviors are associated with an increased chance of having a stroke. You may prevent a stroke by making healthy choices and managing medical conditions. How can I reduce my risk of having a stroke?  Stay physically active. Get at least 30 minutes of activity on most or all days.  Do not smoke. It may also be helpful to avoid exposure to secondhand smoke.  Limit alcohol use. Moderate alcohol use is considered to be: ? No more than 2 drinks per day for men. ? No more than 1 drink per day for nonpregnant women.  Eat healthy foods. This involves: ? Eating 5 or more servings of fruits and vegetables a day. ? Making dietary changes that address high blood pressure (hypertension), high cholesterol, diabetes, or obesity.  Manage your cholesterol levels. ? Making food choices that are high in fiber and low in saturated fat, trans fat, and cholesterol may control cholesterol levels. ? Take any prescribed medicines to control cholesterol as directed by your health care provider.  Manage your diabetes. ? Controlling your carbohydrate and sugar intake is recommended to manage diabetes. ? Take any prescribed medicines to control diabetes as directed by your health care provider.  Control your hypertension. ? Making food choices that are  low in salt (sodium), saturated fat, trans fat, and cholesterol is recommended to manage hypertension. ? Ask your health care provider if you need treatment to lower your blood pressure. Take any prescribed medicines to control hypertension as directed by your health care provider. ? If you are 6-69 years of age, have your blood pressure checked every 3-5 years. If you are 70 years of age or older, have your blood pressure checked every year.  Maintain a healthy weight. ? Reducing calorie intake and making food choices that are low in sodium, saturated fat, trans fat, and cholesterol are recommended to manage weight.  Stop drug abuse.  Avoid taking birth control pills. ? Talk to your health care provider about the risks of taking birth control pills if you are over 38 years old, smoke, get migraines, or have ever had a blood clot.  Get evaluated for sleep disorders (sleep apnea). ? Talk to your health care provider about getting a sleep evaluation if you snore a lot or have excessive sleepiness.  Take medicines only as directed by your health care provider. ? For some people, aspirin or blood thinners (anticoagulants) are helpful in reducing the risk of forming abnormal blood clots that can lead to stroke. If you have the irregular heart rhythm of atrial fibrillation, you should be on a blood thinner unless there is a good reason you cannot take them. ? Understand all your medicine instructions.  Make sure that other conditions (such as anemia or atherosclerosis) are addressed. Get help right away if:  You have sudden weakness or numbness of the face, arm, or leg, especially on one side of the body.  Your face or eyelid  droops to one side.  You have sudden confusion.  You have trouble speaking (aphasia) or understanding.  You have sudden trouble seeing in one or both eyes.  You have sudden trouble walking.  You have dizziness.  You have a loss of balance or coordination.  You  have a sudden, severe headache with no known cause.  You have new chest pain or an irregular heartbeat. Any of these symptoms may represent a serious problem that is an emergency. Do not wait to see if the symptoms will go away. Get medical help at once. Call your local emergency services (911 in [place] Do not drive yourself to the hospital. This information is not intended to replace advice given to you by your health care provider. Make sure you discuss any questions you have with your health care provider. Document Released: 12/19/2004 Document Revised: 04/18/2016 Document Reviewed: 05/14/2013 Elsevier Interactive Patient Education  2017 [employer].

## 2017-07-24 NOTE — Progress Notes (Signed)
I agree with the above plan 

## 2017-09-15 ENCOUNTER — Telehealth: Payer: Self-pay

## 2017-09-15 NOTE — Telephone Encounter (Signed)
Received fax from [facility] requesting advice on stopping Plavix for colonoscopy EGD. Per Dr. Leonie Man, "ok to stop Plavix for 3-5 days prior to procedure with a small but acceptable risk of TIA/stroke if pt is willing." Form faxed back to F # [phone].

## 2017-09-15 NOTE — Telephone Encounter (Signed)
Opened in error

## 2017-11-19 ENCOUNTER — Encounter (HOSPITAL_BASED_OUTPATIENT_CLINIC_OR_DEPARTMENT_OTHER): Payer: Self-pay | Admitting: *Deleted

## 2017-11-19 ENCOUNTER — Other Ambulatory Visit: Payer: Self-pay

## 2017-11-19 ENCOUNTER — Emergency Department (HOSPITAL_BASED_OUTPATIENT_CLINIC_OR_DEPARTMENT_OTHER)
Admission: EM | Admit: 2017-11-19 | Discharge: 2017-11-19 | Disposition: A | Discharge status: Home / Self Care | Payer: Medicare Other | Attending: Emergency Medicine | Admitting: Emergency Medicine

## 2017-11-19 DIAGNOSIS — Z794 Long term (current) use of insulin: Secondary | ICD-10-CM | POA: Diagnosis not present

## 2017-11-19 DIAGNOSIS — I10 Essential (primary) hypertension: Secondary | ICD-10-CM | POA: Diagnosis not present

## 2017-11-19 DIAGNOSIS — E119 Type 2 diabetes mellitus without complications: Secondary | ICD-10-CM | POA: Diagnosis not present

## 2017-11-19 DIAGNOSIS — W102XXA Fall (on)(from) incline, initial encounter: Secondary | ICD-10-CM | POA: Diagnosis not present

## 2017-11-19 DIAGNOSIS — Z8673 Personal history of transient ischemic attack (TIA), and cerebral infarction without residual deficits: Secondary | ICD-10-CM | POA: Diagnosis not present

## 2017-11-19 DIAGNOSIS — Y929 Unspecified place or not applicable: Secondary | ICD-10-CM | POA: Insufficient documentation

## 2017-11-19 DIAGNOSIS — Z7902 Long term (current) use of antithrombotics/antiplatelets: Secondary | ICD-10-CM | POA: Diagnosis not present

## 2017-11-19 DIAGNOSIS — S0121XA Laceration without foreign body of nose, initial encounter: Secondary | ICD-10-CM | POA: Insufficient documentation

## 2017-11-19 DIAGNOSIS — Z87891 Personal history of nicotine dependence: Secondary | ICD-10-CM | POA: Diagnosis not present

## 2017-11-19 DIAGNOSIS — Y999 Unspecified external cause status: Secondary | ICD-10-CM | POA: Insufficient documentation

## 2017-11-19 DIAGNOSIS — S0993XA Unspecified injury of face, initial encounter: Secondary | ICD-10-CM | POA: Diagnosis present

## 2017-11-19 DIAGNOSIS — Z79899 Other long term (current) drug therapy: Secondary | ICD-10-CM | POA: Diagnosis not present

## 2017-11-19 DIAGNOSIS — Y9389 Activity, other specified: Secondary | ICD-10-CM | POA: Insufficient documentation

## 2017-11-19 DIAGNOSIS — W19XXXA Unspecified fall, initial encounter: Secondary | ICD-10-CM

## 2017-11-19 DIAGNOSIS — S0990XA Unspecified injury of head, initial encounter: Secondary | ICD-10-CM

## 2017-11-19 MED ORDER — LIDOCAINE HCL (PF) 1 % IJ SOLN
5.0000 mL | Freq: Once | INTRAMUSCULAR | Status: AC
  Administered 2017-11-19: 5 mL
  Filled 2017-11-19: qty 5

## 2017-11-19 NOTE — ED Provider Notes (Signed)
[facility] EMERGENCY DEPARTMENT Provider Note   CSN: [id number] Arrival date & time: 11/19/17  1735     History   Chief Complaint Chief Complaint  Patient presents with  . Fall    HPI Monica Pearson is a 71 y.o. female.  HPI Patient fell and landed directly on her nose.  She reports she tripped.  She denies loss of consciousness.  Reports that the time she had some headache but that is resolved.  No confusion no nausea no vomiting.  She has a swelling and a laceration to the nose.  She does take Plavix.  She denies any other associated injury. Past Medical History:  Diagnosis Date  . Diabetes mellitus without complication ([place])   . GERD (gastroesophageal reflux disease)   . Hypertension   . Sarcoidosis   . Stroke [facility])     Patient Active Problem List   Diagnosis Date Noted  . Hyperlipidemia 07/22/2017  . Hypertension 07/22/2017  . Obstructive sleep apnea treated with continuous positive airway pressure (CPAP) 07/22/2017  . Acute CVA (cerebrovascular accident) ([place]) 12/15/2016  . TIA (transient ischemic attack) 12/14/2016  . GERD (gastroesophageal reflux disease) 12/14/2016  . Diabetes ([place]) 12/14/2016  . No blood products 12/14/2016    Past Surgical History:  Procedure Laterality Date  . CHOLECYSTECTOMY    . KNEE SURGERY      OB History    No data available       Home Medications    Prior to Admission medications   Medication Sig Start Date End Date Taking? Authorizing Provider  atenolol (TENORMIN) 50 MG tablet Take 50 mg by mouth daily.   Yes [provider]  chlorthalidone (HYGROTON) 25 MG tablet Take 25 mg by mouth daily.   Yes [provider]  clopidogrel (PLAVIX) 75 MG tablet Take 1 tablet (75 mg total) by mouth daily. 12/16/16  Yes Rai, Ripudeep K, MD  glipiZIDE (GLUCOTROL XL) 10 MG 24 hr tablet Take 10 mg by mouth 2 (two) times daily.   Yes [provider]  insulin lispro (HUMALOG KWIKPEN) 100 UNIT/ML KiwkPen  Inject 15 Units into the skin 3 (three) times daily with meals. MAX OF 50 UNITS/DAY    Yes [provider]  liraglutide (VICTOZA) 18 MG/3ML SOPN Inject 1.8 mg into the skin at bedtime.   Yes [provider]  Magnesium 500 MG CAPS Take 500 mg by mouth at bedtime.   Yes [provider]  metFORMIN (GLUCOPHAGE) 500 MG tablet Take 500 mg by mouth 2 (two) times daily with a meal.   Yes [provider]  Propylene Glycol (SYSTANE BALANCE OP) Place 1-2 drops into both eyes 3 (three) times daily as needed (for dry eyes).   Yes [provider]  benazepril (LOTENSIN) 40 MG tablet Take 40 mg by mouth daily.    [provider]  Calcium-Magnesium-Vitamin D (CALCIUM 1200+D3 PO) Take 2 tablets by mouth at bedtime.    [provider]  ezetimibe (ZETIA) 10 MG tablet Take 10 mg by mouth every morning. 11/06/16 11/06/17  [provider]  Insulin Glargine (LANTUS SOLOSTAR) 100 UNIT/ML Solostar Pen Inject 35 Units into the skin at bedtime. MAX 50 UNITS/DAY    [provider]  ondansetron (ZOFRAN ODT) 8 MG disintegrating tablet 58m ODT q4 hours prn nausea 02/23/15   DVeryl Speak MD    Family History Family History  Problem Relation Age of Onset  . Stroke Mother   . Bladder Cancer Mother   .  CAD Father   . Prostate cancer Father     Social History Social History   Tobacco Use  . Smoking status: Former Research scientist (life sciences)  . Smokeless tobacco: Never Used  Substance Use Topics  . Alcohol use: No  . Drug use: No     Allergies   Celecoxib; Rofecoxib; Ciprofloxacin; Darvon [propoxyphene]; Hydrocodone-acetaminophen; and Morphine   Review of Systems Review of Systems 10 Systems reviewed and are negative for acute change except as noted in the HPI.   Physical Exam Updated Vital Signs BP (!) 190/77 (BP Location: Right Arm)   Pulse 76   Temp 97.7 F (36.5 C) (Oral)   Resp 18   Ht 5' 4"  (1.626 m)   Wt 95.7 kg (211 lb)   SpO2 100%    BMI 36.22 kg/m   Physical Exam  Constitutional: She is oriented to person, place, and time. She appears well-developed and well-nourished. No distress.  HENT:  Patient has nasal contusion with moderate diffuse swelling.  Overlying 1 cm obliquely oriented laceration just proximal to the tip and distal to the nasal bones.  Both nasal passages are clear.  Small amount of dried blood in the left nare.  Septum is not displaced, no septal hematoma.  Oral cavity pink and moist no intraoral injury.  Eyes: Conjunctivae and EOM are normal. Pupils are equal, round, and reactive to light.  Neck: Neck supple.  No C-spine tenderness to palpation.  Cardiovascular: Normal rate and regular rhythm.  No murmur heard. Pulmonary/Chest: Effort normal and breath sounds normal. No respiratory distress.  Abdominal: Soft. There is no tenderness.  Musculoskeletal: Normal range of motion. She exhibits no edema or tenderness.  Neurological: She is alert and oriented to person, place, and time. No cranial nerve deficit. She exhibits normal muscle tone. Coordination normal.  Skin: Skin is warm and dry.  Psychiatric: She has a normal mood and affect.  Nursing note and vitals reviewed.    ED Treatments / Results  Labs (all labs ordered are listed, but only abnormal results are displayed) Labs Reviewed - No data to display  EKG  EKG Interpretation None       Radiology No results found.  Procedures .Marland KitchenLaceration Repair Date/Time: 11/19/2017 10:31 PM Performed by: Charlesetta Shanks, MD Authorized by: Charlesetta Shanks, MD   Consent:    Consent obtained:  Verbal   Consent given by:  Patient   Risks discussed:  Infection, poor cosmetic result, poor wound healing and need for additional repair Anesthesia (see MAR for exact dosages):    Anesthesia method:  Local infiltration   Local anesthetic:  Lidocaine 2% w/o epi Laceration details:    Location:  Face   Face location:  Nose   Length (cm):  1   Depth  (mm):  3 Repair type:    Repair type:  Simple Pre-procedure details:    Preparation:  Patient was prepped and draped in usual sterile fashion Exploration:    Wound extent: areolar tissue violated     Contaminated: no   Treatment:    Area cleansed with:  Saline and Shur-Clens   Amount of cleaning:  Standard Skin repair:    Repair method:  Sutures   Suture size:  5-0   Suture material:  Nylon   Suture technique:  Simple interrupted   Number of sutures:  4 Approximation:    Approximation:  Close   Vermilion border: well-aligned   Post-procedure details:    Patient tolerance of procedure:  Tolerated well, no immediate complications   (  including critical care time)  Medications Ordered in ED Medications  lidocaine (PF) (XYLOCAINE) 1 % injection 5 mL (5 mLs Infiltration Given 11/19/17 2042)     Initial Impression / Assessment and Plan / ED Course  I have reviewed the triage vital signs and the nursing notes.  Pertinent labs & imaging results that were available during my care of the patient were reviewed by me and considered in my medical decision making (see chart for details).     Final Clinical Impressions(s) / ED Diagnoses   Final diagnoses:  Injury of head, initial encounter  Laceration of nose, initial encounter  Fall, initial encounter   Patient presents with fall with only nasal injury.  Patient repaired as outlined.  Patient does have moderate contusion and diffuse swelling of the nose.  There may be underlying nasal fracture however nose does appear straight in the septum appears straight without significant clot or bleeding within the nose.  The patient is instructed on follow-up plan with reassessment by her PCP and removal of stitches, with referral to ENT if needed.  Head Injury precautions reviewed and provided. Patient  is with family members. ED Discharge Orders    None       Charlesetta Shanks, MD 11/19/17 2234

## 2017-11-19 NOTE — ED Notes (Signed)
MD notified of pt's fall (occurred around 1630 and pt is taking Plavix -- lac and swelling noted to nose); no new verbal orders received at this time. Pt alert and oriented.

## 2017-11-19 NOTE — ED Notes (Signed)
EDP is now at the bedside

## 2017-11-19 NOTE — ED Triage Notes (Signed)
Pt reports she tripped while walking up the stairs on her wooden deck and fell onto her face -- pt has approx 1.5cm lac to bridge of nose (bleeding controlled). Denies LOC, other known injuries.

## 2017-11-19 NOTE — Discharge Instructions (Signed)
1.  Sleep with your head and upper body elevated at about 30 degrees to help with swelling. 2.  After 24 hours, start cleaning your wound daily with 1/2 water and 1/2 hydrogen peroxide, rinsing with water then dabbing dry and reapplying antibiotic ointment and dressing. 2.  Try to reschedule with your doctor within 7 days for wound check and suture removal.

## 2017-11-26 ENCOUNTER — Encounter (HOSPITAL_BASED_OUTPATIENT_CLINIC_OR_DEPARTMENT_OTHER): Payer: Self-pay | Admitting: Emergency Medicine

## 2017-11-26 ENCOUNTER — Other Ambulatory Visit: Payer: Self-pay

## 2017-11-26 ENCOUNTER — Emergency Department (HOSPITAL_BASED_OUTPATIENT_CLINIC_OR_DEPARTMENT_OTHER): Payer: Medicare Other

## 2017-11-26 ENCOUNTER — Emergency Department (HOSPITAL_BASED_OUTPATIENT_CLINIC_OR_DEPARTMENT_OTHER)
Admission: EM | Admit: 2017-11-26 | Discharge: 2017-11-26 | Disposition: A | Discharge status: Home / Self Care | Payer: Medicare Other | Attending: Emergency Medicine | Admitting: Emergency Medicine

## 2017-11-26 DIAGNOSIS — Z7902 Long term (current) use of antithrombotics/antiplatelets: Secondary | ICD-10-CM | POA: Insufficient documentation

## 2017-11-26 DIAGNOSIS — E119 Type 2 diabetes mellitus without complications: Secondary | ICD-10-CM | POA: Diagnosis not present

## 2017-11-26 DIAGNOSIS — K7460 Unspecified cirrhosis of liver: Secondary | ICD-10-CM | POA: Diagnosis not present

## 2017-11-26 DIAGNOSIS — Z794 Long term (current) use of insulin: Secondary | ICD-10-CM | POA: Diagnosis not present

## 2017-11-26 DIAGNOSIS — R188 Other ascites: Secondary | ICD-10-CM | POA: Diagnosis not present

## 2017-11-26 DIAGNOSIS — Z8673 Personal history of transient ischemic attack (TIA), and cerebral infarction without residual deficits: Secondary | ICD-10-CM | POA: Diagnosis not present

## 2017-11-26 DIAGNOSIS — I10 Essential (primary) hypertension: Secondary | ICD-10-CM | POA: Insufficient documentation

## 2017-11-26 DIAGNOSIS — Z79899 Other long term (current) drug therapy: Secondary | ICD-10-CM | POA: Insufficient documentation

## 2017-11-26 DIAGNOSIS — R609 Edema, unspecified: Secondary | ICD-10-CM

## 2017-11-26 DIAGNOSIS — Z87891 Personal history of nicotine dependence: Secondary | ICD-10-CM | POA: Diagnosis not present

## 2017-11-26 DIAGNOSIS — R0602 Shortness of breath: Secondary | ICD-10-CM | POA: Diagnosis present

## 2017-11-26 HISTORY — DX: Unspecified cirrhosis of liver: K74.60

## 2017-11-26 LAB — CBC WITH DIFFERENTIAL/PLATELET
BASOS ABS: 0 10*3/uL (ref 0.0–0.1)
BASOS PCT: 0 %
Eosinophils Absolute: 0.1 10*3/uL (ref 0.0–0.7)
Eosinophils Relative: 1 %
HEMATOCRIT: 30.3 % — AB (ref 36.0–46.0)
HEMOGLOBIN: 11.5 g/dL — AB (ref 12.0–15.0)
LYMPHS ABS: 1.9 10*3/uL (ref 0.7–4.0)
LYMPHS PCT: 32 %
MCH: 27.8 pg (ref 26.0–34.0)
MCHC: 38 g/dL — AB (ref 30.0–36.0)
MCV: 73.4 fL — AB (ref 78.0–100.0)
MONOS PCT: 12 %
Monocytes Absolute: 0.7 10*3/uL (ref 0.1–1.0)
Neutro Abs: 3.2 10*3/uL (ref 1.7–7.7)
Neutrophils Relative %: 55 %
Platelets: 181 10*3/uL (ref 150–400)
RBC: 4.13 MIL/uL (ref 3.87–5.11)
RDW: 14.9 % (ref 11.5–15.5)
WBC: 5.9 10*3/uL (ref 4.0–10.5)

## 2017-11-26 LAB — COMPREHENSIVE METABOLIC PANEL
ALK PHOS: 307 U/L — AB (ref 38–126)
ALT: 108 U/L — ABNORMAL HIGH (ref 14–54)
ANION GAP: 7 (ref 5–15)
AST: 148 U/L — ABNORMAL HIGH (ref 15–41)
Albumin: 2.6 g/dL — ABNORMAL LOW (ref 3.5–5.0)
BILIRUBIN TOTAL: 2 mg/dL — AB (ref 0.3–1.2)
BUN: 15 mg/dL (ref 6–20)
CALCIUM: 8.4 mg/dL — AB (ref 8.9–10.3)
CO2: 27 mmol/L (ref 22–32)
Chloride: 101 mmol/L (ref 101–111)
Creatinine, Ser: 0.58 mg/dL (ref 0.44–1.00)
GFR calc non Af Amer: >60 mL/min (ref >60)
GLUCOSE: 164 mg/dL — AB (ref 65–99)
POTASSIUM: 3.4 mmol/L — AB (ref 3.5–5.1)
Sodium: 135 mmol/L (ref 135–145)
TOTAL PROTEIN: 7.3 g/dL (ref 6.5–8.1)

## 2017-11-26 LAB — BRAIN NATRIURETIC PEPTIDE: B NATRIURETIC PEPTIDE 5: 100.7 pg/mL — AB (ref 0.0–100.0)

## 2017-11-26 LAB — D-DIMER, QUANTITATIVE: D-Dimer, Quant: 8.54 ug/mL-FEU — ABNORMAL HIGH (ref 0.00–0.50)

## 2017-11-26 MED ORDER — IOPAMIDOL (ISOVUE-300) INJECTION 61%: 100.0000 mL | Freq: Once | INTRAVENOUS | Status: DC | PRN

## 2017-11-26 MED ORDER — SPIRONOLACTONE 100 MG PO TABS: 100.0000 mg | ORAL_TABLET | Freq: Every day | ORAL | 0 refills | Status: DC

## 2017-11-26 MED ORDER — IOPAMIDOL (ISOVUE-370) INJECTION 76%
100.0000 mL | Freq: Once | INTRAVENOUS | Status: AC | PRN
  Administered 2017-11-26: 70 mL via INTRAVENOUS

## 2017-11-26 NOTE — Discharge Instructions (Signed)
IT IS VERY IMPORTANT FOR YOU TO FOLLOW CLOSELY WITH YOUR PRIMARY CARE PROVIDER AND GASTROENTEROLOGIST FOR FURTHER TESTING, INCLUDING REPEAT LABS IN A FEW DAYS AFTER STARTING SPIRONOLACTONE.

## 2017-11-26 NOTE — ED Notes (Signed)
amb to BR

## 2017-11-26 NOTE — ED Notes (Signed)
Patient transported to CT 

## 2017-11-26 NOTE — ED Provider Notes (Signed)
[facility] EMERGENCY DEPARTMENT Provider Note   CSN: [id number] Arrival date & time: 11/26/17  1409     History   Chief Complaint Chief Complaint  Patient presents with  . Shortness of Breath  . Leg Swelling    HPI Monica Pearson is a 72 y.o. female.  72yo F w/ PMH including sarcoidosis, CVA, T2DM, HTN, cirrhosis who p/w shortness of breath and leg swelling. She reports 2 days of b/l LE edema associated with SOB on exertion, no SOB at rest. No orthopnea or PND and no associated chest pain. She has unintentionally gained ~20 lb over the past 1 week. No nausea, vomiting, fevers, urinary symptoms, abdominal pain, recent travel, h/o blood clots, h/o cancer, or estrogen use. No known h/o heart disease.  PT has new diagnosis of cirrhosis, unclear etiology, that is being worked up with gastroenterologist. She has been to 1 clinic appointment and had colonoscopy/EGD.   The history is provided by the patient.  Shortness of Breath     Past Medical History:  Diagnosis Date  . Cirrhosis ([place])   . Diabetes mellitus without complication ([place])   . GERD (gastroesophageal reflux disease)   . Hypertension   . Sarcoidosis   . Stroke [facility])     Patient Active Problem List   Diagnosis Date Noted  . Hyperlipidemia 07/22/2017  . Hypertension 07/22/2017  . Obstructive sleep apnea treated with continuous positive airway pressure (CPAP) 07/22/2017  . Acute CVA (cerebrovascular accident) ([place]) 12/15/2016  . TIA (transient ischemic attack) 12/14/2016  . GERD (gastroesophageal reflux disease) 12/14/2016  . Diabetes ([place]) 12/14/2016  . No blood products 12/14/2016    Past Surgical History:  Procedure Laterality Date  . CHOLECYSTECTOMY    . KNEE SURGERY      OB History    No data available       Home Medications    Prior to Admission medications   Medication Sig Start Date End Date Taking? Authorizing Provider  atenolol (TENORMIN) 50 MG tablet Take 50 mg by mouth daily.     [provider]  benazepril (LOTENSIN) 40 MG tablet Take 40 mg by mouth daily.    [provider]  Calcium-Magnesium-Vitamin D (CALCIUM 1200+D3 PO) Take 2 tablets by mouth at bedtime.    [provider]  chlorthalidone (HYGROTON) 25 MG tablet Take 25 mg by mouth daily.    [provider]  clopidogrel (PLAVIX) 75 MG tablet Take 1 tablet (75 mg total) by mouth daily. 12/16/16   Rai, Vernelle Emerald, MD  ezetimibe (ZETIA) 10 MG tablet Take 10 mg by mouth every morning. 11/06/16 11/06/17  [provider]  glipiZIDE (GLUCOTROL XL) 10 MG 24 hr tablet Take 10 mg by mouth 2 (two) times daily.    [provider]  insulin lispro (HUMALOG KWIKPEN) 100 UNIT/ML KiwkPen Inject 15 Units into the skin 3 (three) times daily with meals. MAX OF 50 UNITS/DAY     [provider]  liraglutide (VICTOZA) 18 MG/3ML SOPN Inject 1.8 mg into the skin at bedtime.    [provider]  Magnesium 500 MG CAPS Take 500 mg by mouth at bedtime.    [provider]  metFORMIN (GLUCOPHAGE) 500 MG tablet Take 500 mg by mouth 2 (two) times daily with a meal.    [provider]  ondansetron (ZOFRAN ODT) 8 MG disintegrating tablet 46m ODT q4 hours prn nausea 02/23/15   DVeryl Speak MD  Propylene Glycol (SYSTANE BALANCE OP) Place 1-2 drops into  both eyes 3 (three) times daily as needed (for dry eyes).    [provider]  spironolactone (ALDACTONE) 100 MG tablet Take 1 tablet (100 mg total) by mouth daily. 11/26/17   Sharnice Bosler, Wenda Overland, MD    Family History Family History  Problem Relation Age of Onset  . Stroke Mother   . Bladder Cancer Mother   . CAD Father   . Prostate cancer Father     Social History Social History   Tobacco Use  . Smoking status: Former Research scientist (life sciences)  . Smokeless tobacco: Never Used  Substance Use Topics  . Alcohol use: No  . Drug use: No     Allergies   Celecoxib; Rofecoxib; Ciprofloxacin; Darvon [propoxyphene];  Hydrocodone-acetaminophen; and Morphine   Review of Systems Review of Systems  Respiratory: Positive for shortness of breath.    All other systems reviewed and are negative except that which was mentioned in HPI   Physical Exam Updated Vital Signs BP 133/76 (BP Location: Left Arm)   Pulse 64   Temp 98.2 F (36.8 C)   Resp 18   Ht 5' 4"  (1.626 m)   Wt 104.7 kg (230 lb 13.2 oz)   SpO2 97%   BMI 39.62 kg/m   Physical Exam  Constitutional: She is oriented to person, place, and time. She appears well-developed and well-nourished. No distress.  HENT:  Head: Normocephalic and atraumatic.  Moist mucous membranes  Eyes: Conjunctivae are normal.  Neck: Neck supple.  Cardiovascular: Normal rate, regular rhythm and normal heart sounds.  No murmur heard. Pulmonary/Chest: Effort normal and breath sounds normal.  Abdominal: Soft. Bowel sounds are normal. She exhibits distension (moderate). There is no tenderness.  Musculoskeletal:       Right lower leg: She exhibits edema.       Left lower leg: She exhibits edema.  2+ pitting edema BLE  Neurological: She is alert and oriented to person, place, and time.  Fluent speech  Skin: Skin is warm and dry.  Psychiatric: She has a normal mood and affect. Judgment normal.  Nursing note and vitals reviewed.    ED Treatments / Results  Labs (all labs ordered are listed, but only abnormal results are displayed) Labs Reviewed  BRAIN NATRIURETIC PEPTIDE - Abnormal; Notable for the following components:      Result Value   B Natriuretic Peptide 100.7 (*)    All other components within normal limits  CBC WITH DIFFERENTIAL/PLATELET - Abnormal; Notable for the following components:   Hemoglobin 11.5 (*)    HCT 30.3 (*)    MCV 73.4 (*)    MCHC 38.0 (*)    All other components within normal limits  COMPREHENSIVE METABOLIC PANEL - Abnormal; Notable for the following components:   Potassium 3.4 (*)    Glucose, Bld 164 (*)    Calcium 8.4 (*)      Albumin 2.6 (*)    AST 148 (*)    ALT 108 (*)    Alkaline Phosphatase 307 (*)    Total Bilirubin 2.0 (*)    All other components within normal limits  D-DIMER, QUANTITATIVE (NOT AT [facility]) - Abnormal; Notable for the following components:   D-Dimer, Quant 8.54 (*)    All other components within normal limits  CBC WITH DIFFERENTIAL/PLATELET    EKG  EKG Interpretation  Date/Time:  Wednesday November 26 2017 14:15:22 EST Ventricular Rate:  64 PR Interval:  172 QRS Duration: 80 QT Interval:  304 QTC Calculation: 313 R Axis:  16 Text Interpretation:  Normal sinus rhythm Low voltage QRS Cannot rule out Anterior infarct , age undetermined Abnormal ECG No significant change since last tracing Confirmed by Theotis Burrow [phone]) on 11/26/2017 4:03:26 PM       Radiology Dg Chest 2 View  Result Date: 11/26/2017 CLINICAL DATA:  Shortness of breath with leg swelling for 2 days. History of diabetes, hypertension, sarcoidosis and cirrhosis. EXAM: CHEST  2 VIEW COMPARISON:  No previous chest radiographs are available. Limited correlation made with abdominal CT 07/15/2017 and left shoulder radiographs 02/16/2013. FINDINGS: The heart size and mediastinal contours are normal. There are low lung volumes with mild central airway thickening. No confluent airspace opacity, pleural effusion or pneumothorax. Mild degenerative changes are present throughout the spine. Telemetry leads overlie the chest. IMPRESSION: Low lung volumes with mild central airway thickening, suggesting bronchitis. No focal airspace disease. Electronically Signed   By: Richardean Sale M.D.   On: 11/26/2017 15:02   Ct Angio Chest Pe W/cm &/or Wo Cm  Result Date: 11/26/2017 CLINICAL DATA:  Shortness of breath, bilateral ankle swelling for 2 days. Recent diagnosis of cirrhosis. History of hypertension, GERD, diabetes, cholecystectomy. EXAM: CT ANGIOGRAPHY CHEST WITH CONTRAST TECHNIQUE: Multidetector CT imaging of the chest was performed  using the standard protocol during bolus administration of intravenous contrast. Multiplanar CT image reconstructions and MIPs were obtained to evaluate the vascular anatomy. CONTRAST:  70m ISOVUE-370 IOPAMIDOL (ISOVUE-370) INJECTION 76% COMPARISON:  None. FINDINGS: Cardiovascular: Some of the most peripheral segmental and subsegmental pulmonary arteries are difficult to definitively characterize due to mild patient breathing motion artifact, however, there is no pulmonary embolism identified within the main, lobar or segmental pulmonary arteries bilaterally. No thoracic aortic aneurysm or evidence of aortic dissection. Heart size is normal. No pericardial effusion. Coronary artery calcifications noted. Mediastinum/Nodes: Mild lymphadenopathy within the perihilar regions bilaterally, representative lymph node at the right hilum measuring approximately 1.3 cm short axis dimension. No mass or enlarged lymph nodes within the mediastinum. Esophagus is unremarkable. Trachea and central bronchi are unremarkable. Lungs/Pleura: Lungs are clear.  No pleural effusion or pneumothorax. Upper Abdomen: Upper abdominal varices. Additional paraesophageal varices within the lower mediastinum. Cirrhotic liver is incompletely imaged. Ascites is present within the upper abdomen, at least moderate in amount, incompletely imaged. Musculoskeletal: Degenerative spurring within the thoracic spine, mild to moderate in degree. No acute or suspicious osseous finding. Review of the MIP images confirms the above findings. IMPRESSION: 1. No pulmonary embolism identified, with mild study limitations detailed above. 2. Lungs are clear.  No pneumonia or pulmonary edema. 3. Mild perihilar lymphadenopathy, most likely reactive in nature. Per consensus guidelines, no follow-up imaging is necessary. No mediastinal lymphadenopathy. 4. Cirrhotic appearing liver, incompletely imaged. Moderate amount of ascites within the upper abdomen. Associated upper  abdominal varices and paraesophageal varices. Electronically Signed   By: SFranki CabotM.D.   On: 11/26/2017 18:29    Procedures Procedures (including critical care time)  Medications Ordered in ED Medications  iopamidol (ISOVUE-370) 76 % injection 100 mL (70 mLs Intravenous Contrast Given 11/26/17 1807)     Initial Impression / Assessment and Plan / ED Course  I have reviewed the triage vital signs and the nursing notes.  Pertinent labs & imaging results that were available during my care of the patient were reviewed by me and considered in my medical decision making (see chart for details).    Pt w/ worsening LE edema and SOB w/ exertion, recent diagnosis of cirrhosis. She was comfortable on  exam w/ reassuring VS. EKG similar to previous.  Her exam suggests volume overload which may be related to cirrhosis, differential includes CHF.  BNP was 100, AST 148, ALT 108, t bili 2, low albumin. K 3.4. D dimer elevated. Obtained CTA which was negative for PE or infiltrate.  Moderate ascites noted.  Given her low albumin and known cirrhosis that her edema is related to volume overload from liver disease.  I contacted the patient's gastroenterology clinic and discussed with PA Chilton [place]ouse. She recommended starting spironolactone 121m daily, especially given borderline low potassium today.  Discussed this plan with the patient and instructed to follow-up with her gastroenterologist as well as PCP.  She voiced understanding of return precautions and was discharged in satisfactory condition.  Final Clinical Impressions(s) / ED Diagnoses   Final diagnoses:  Peripheral edema  Cirrhosis of liver with ascites, unspecified hepatic cirrhosis type ([facility]    ED Discharge Orders        Ordered    spironolactone (ALDACTONE) 100 MG tablet  Daily     11/26/17 1920       Jasean Ambrosia, RWenda Overland MD 11/26/17 2345

## 2017-11-26 NOTE — ED Triage Notes (Addendum)
Patient reports shortness of breath and bilateral ankle swelling x 2 days.  Denies chest pain, nausea, vomiting.  Reports recently diagnosed with cirrhosis in November.  Patient states large weight increase within the past week.  Reports she feels as though she is retaining fluid.

## 2017-11-26 NOTE — ED Notes (Signed)
Pt verbalizes understanding of d/c instructions and denies any further needs at this time. 

## 2018-12-14 ENCOUNTER — Other Ambulatory Visit: Payer: Self-pay

## 2018-12-14 ENCOUNTER — Encounter (HOSPITAL_BASED_OUTPATIENT_CLINIC_OR_DEPARTMENT_OTHER): Payer: Self-pay

## 2018-12-14 ENCOUNTER — Emergency Department (HOSPITAL_BASED_OUTPATIENT_CLINIC_OR_DEPARTMENT_OTHER): Payer: Medicare Other

## 2018-12-14 ENCOUNTER — Emergency Department (HOSPITAL_BASED_OUTPATIENT_CLINIC_OR_DEPARTMENT_OTHER)
Admission: EM | Admit: 2018-12-14 | Discharge: 2018-12-14 | Disposition: A | Discharge status: Home / Self Care | Payer: Medicare Other | Attending: Emergency Medicine | Admitting: Emergency Medicine

## 2018-12-14 DIAGNOSIS — Z7902 Long term (current) use of antithrombotics/antiplatelets: Secondary | ICD-10-CM | POA: Diagnosis not present

## 2018-12-14 DIAGNOSIS — I6200 Nontraumatic subdural hemorrhage, unspecified: Secondary | ICD-10-CM | POA: Diagnosis not present

## 2018-12-14 DIAGNOSIS — S065XAA Traumatic subdural hemorrhage with loss of consciousness status unknown, initial encounter: Secondary | ICD-10-CM

## 2018-12-14 DIAGNOSIS — Z794 Long term (current) use of insulin: Secondary | ICD-10-CM | POA: Insufficient documentation

## 2018-12-14 DIAGNOSIS — I10 Essential (primary) hypertension: Secondary | ICD-10-CM | POA: Insufficient documentation

## 2018-12-14 DIAGNOSIS — R410 Disorientation, unspecified: Secondary | ICD-10-CM | POA: Diagnosis not present

## 2018-12-14 DIAGNOSIS — Z87891 Personal history of nicotine dependence: Secondary | ICD-10-CM | POA: Diagnosis not present

## 2018-12-14 DIAGNOSIS — E119 Type 2 diabetes mellitus without complications: Secondary | ICD-10-CM | POA: Diagnosis not present

## 2018-12-14 DIAGNOSIS — Z79899 Other long term (current) drug therapy: Secondary | ICD-10-CM | POA: Insufficient documentation

## 2018-12-14 DIAGNOSIS — S065X9A Traumatic subdural hemorrhage with loss of consciousness of unspecified duration, initial encounter: Secondary | ICD-10-CM

## 2018-12-14 DIAGNOSIS — R42 Dizziness and giddiness: Secondary | ICD-10-CM

## 2018-12-14 LAB — URINALYSIS, ROUTINE W REFLEX MICROSCOPIC
Bilirubin Urine: NEGATIVE
Glucose, UA: NEGATIVE mg/dL
Hgb urine dipstick: NEGATIVE
KETONES UR: NEGATIVE mg/dL
LEUKOCYTES UA: NEGATIVE
NITRITE: NEGATIVE
PH: 5.5 (ref 5.0–8.0)
Protein, ur: NEGATIVE mg/dL
SPECIFIC GRAVITY, URINE: 1.01 (ref 1.005–1.030)

## 2018-12-14 LAB — COMPREHENSIVE METABOLIC PANEL
ALBUMIN: 2.7 g/dL — AB (ref 3.5–5.0)
ALT: 100 U/L — ABNORMAL HIGH (ref 0–44)
AST: 101 U/L — AB (ref 15–41)
Alkaline Phosphatase: 284 U/L — ABNORMAL HIGH (ref 38–126)
Anion gap: 7 (ref 5–15)
BILIRUBIN TOTAL: 1.2 mg/dL (ref 0.3–1.2)
BUN: 20 mg/dL (ref 8–23)
CALCIUM: 8.8 mg/dL — AB (ref 8.9–10.3)
CHLORIDE: 99 mmol/L (ref 98–111)
CO2: 24 mmol/L (ref 22–32)
Creatinine, Ser: 0.84 mg/dL (ref 0.44–1.00)
GFR calc Af Amer: >60 mL/min (ref >60)
GFR calc non Af Amer: >60 mL/min (ref >60)
GLUCOSE: 166 mg/dL — AB (ref 70–99)
Potassium: 3.9 mmol/L (ref 3.5–5.1)
SODIUM: 130 mmol/L — AB (ref 135–145)
Total Protein: 7.1 g/dL (ref 6.5–8.1)

## 2018-12-14 LAB — CBC WITH DIFFERENTIAL/PLATELET
Abs Immature Granulocytes: 0.04 10*3/uL (ref 0.00–0.07)
BASOS ABS: 0 10*3/uL (ref 0.0–0.1)
Basophils Relative: 0 %
Eosinophils Absolute: 0.1 10*3/uL (ref 0.0–0.5)
Eosinophils Relative: 1 %
HEMATOCRIT: 33.7 % — AB (ref 36.0–46.0)
Hemoglobin: 11.9 g/dL — ABNORMAL LOW (ref 12.0–15.0)
IMMATURE GRANULOCYTES: 1 %
LYMPHS ABS: 2 10*3/uL (ref 0.7–4.0)
LYMPHS PCT: 28 %
MCH: 26.3 pg (ref 26.0–34.0)
MCHC: 35.3 g/dL (ref 30.0–36.0)
MCV: 74.6 fL — ABNORMAL LOW (ref 80.0–100.0)
Monocytes Absolute: 0.7 10*3/uL (ref 0.1–1.0)
Monocytes Relative: 10 %
NEUTROS PCT: 60 %
Neutro Abs: 4.2 10*3/uL (ref 1.7–7.7)
Platelets: 158 10*3/uL (ref 150–400)
RBC: 4.52 MIL/uL (ref 3.87–5.11)
RDW: 15.1 % (ref 11.5–15.5)
WBC: 7.1 10*3/uL (ref 4.0–10.5)

## 2018-12-14 MED ORDER — SODIUM CHLORIDE 0.9 % IV BOLUS
500.0000 mL | Freq: Once | INTRAVENOUS | Status: AC
  Administered 2018-12-14: 500 mL via INTRAVENOUS

## 2018-12-14 NOTE — ED Notes (Signed)
Pt placed on purewick- informed to not get out of bed without informing RN. Family at bedside voiced understanding.

## 2018-12-14 NOTE — ED Provider Notes (Addendum)
[place] EMERGENCY DEPARTMENT Provider Note   CSN: [id number] Arrival date & time: 12/14/18  1226     History   Chief Complaint Chief Complaint  Patient presents with  . Dizziness    HPI Monica Pearson is a 73 y.o. female who past medical history significant for psoriasis, type 2 diabetes, hypertension, hyperlipidemia TIA/CVA who presents today complaining of dizziness, confusion, for the past 3 days.  Patient reports that she has noticed that she has been more confused and is having trouble remembering things.  Confusion has improved today, however she was concerned that because she just started Bentyl as well as symptom experience with secondary to the medication.  Patient denies any weakness difficulty with ambulation or speech. Family members reported possible slurred speech.  She denies any fall, shortness of breath, abdominal pain.  Patient has IBS and has diarrhea at baseline.  She is followed by GI at [facility]. HPI  Past Medical History:  Diagnosis Date  . Cirrhosis ([place])   . Diabetes mellitus without complication ([place])   . GERD (gastroesophageal reflux disease)   . Hypertension   . Sarcoidosis   . Stroke [facility])     Patient Active Problem List   Diagnosis Date Noted  . Hyperlipidemia 07/22/2017  . Hypertension 07/22/2017  . Obstructive sleep apnea treated with continuous positive airway pressure (CPAP) 07/22/2017  . Acute CVA (cerebrovascular accident) ([place]) 12/15/2016  . TIA (transient ischemic attack) 12/14/2016  . GERD (gastroesophageal reflux disease) 12/14/2016  . Diabetes ([place]) 12/14/2016  . No blood products 12/14/2016    Past Surgical History:  Procedure Laterality Date  . CHOLECYSTECTOMY    . KNEE SURGERY       OB History   No obstetric history on file.      Home Medications    Prior to Admission medications   Medication Sig Start Date End Date Taking? Authorizing Provider  atenolol (TENORMIN) 50 MG tablet Take 50 mg by mouth  daily.    [provider]  benazepril (LOTENSIN) 40 MG tablet Take 40 mg by mouth daily.    [provider]  Calcium-Magnesium-Vitamin D (CALCIUM 1200+D3 PO) Take 2 tablets by mouth at bedtime.    [provider]  chlorthalidone (HYGROTON) 25 MG tablet Take 25 mg by mouth daily.    [provider]  ezetimibe (ZETIA) 10 MG tablet Take 10 mg by mouth every morning. 11/06/16 11/06/17  [provider]  glipiZIDE (GLUCOTROL XL) 10 MG 24 hr tablet Take 10 mg by mouth 2 (two) times daily.    [provider]  insulin lispro (HUMALOG KWIKPEN) 100 UNIT/ML KiwkPen Inject 15 Units into the skin 3 (three) times daily with meals. MAX OF 50 UNITS/DAY     [provider]  liraglutide (VICTOZA) 18 MG/3ML SOPN Inject 1.8 mg into the skin at bedtime.    [provider]  Magnesium 500 MG CAPS Take 500 mg by mouth at bedtime.    [provider]  metFORMIN (GLUCOPHAGE) 500 MG tablet Take 500 mg by mouth 2 (two) times daily with a meal.    [provider]  ondansetron (ZOFRAN ODT) 8 MG disintegrating tablet 46m ODT q4 hours prn nausea 02/23/15   DVeryl Speak MD  Propylene Glycol (SYSTANE BALANCE OP) Place 1-2 drops into both eyes 3 (three) times daily as needed (for dry eyes).    [provider]  spironolactone (ALDACTONE) 100 MG tablet Take 1 tablet (100 mg total) by mouth daily. 11/26/17  Little, Wenda Overland, MD    Family History Family History  Problem Relation Age of Onset  . Stroke Mother   . Bladder Cancer Mother   . CAD Father   . Prostate cancer Father     Social History Social History   Tobacco Use  . Smoking status: Former Research scientist (life sciences)  . Smokeless tobacco: Never Used  Substance Use Topics  . Alcohol use: No  . Drug use: No     Allergies   Celecoxib; Rofecoxib; Ciprofloxacin; Darvon [propoxyphene]; Hydrocodone-acetaminophen; and Morphine   Review of Systems Review of Systems  Constitutional:  Negative.   HENT: Negative.   Respiratory: Negative.   Cardiovascular: Negative.   Gastrointestinal: Negative.   Genitourinary: Positive for hematuria.  Musculoskeletal: Negative.   Neurological: Positive for dizziness and speech difficulty.  Psychiatric/Behavioral: Negative.     Physical Exam Updated Vital Signs BP (!) 120/49   Pulse (!) 59   Temp 98.2 F (36.8 C) (Oral)   Resp 15   Ht 5' 4"  (1.626 m)   Wt 77.6 kg   SpO2 100%   BMI 29.35 kg/m   Physical Exam Constitutional:      Appearance: Normal appearance.  HENT:     Head: Normocephalic and atraumatic.     Nose: Nose normal.     Mouth/Throat:     Mouth: Mucous membranes are dry.  Eyes:     Extraocular Movements: Extraocular movements intact.     Conjunctiva/sclera: Conjunctivae normal.     Pupils: Pupils are equal, round, and reactive to light.  Neck:     Musculoskeletal: Normal range of motion and neck supple.  Cardiovascular:     Rate and Rhythm: Normal rate and regular rhythm.  Pulmonary:     Effort: Pulmonary effort is normal.     Breath sounds: Normal breath sounds.  Abdominal:     General: Abdomen is flat. Bowel sounds are normal.  Musculoskeletal: Normal range of motion.  Skin:    General: Skin is warm and dry.     Capillary Refill: Capillary refill takes less than 2 seconds.  Neurological:     General: No focal deficit present.     Mental Status: She is alert and oriented to person, place, and time.  Psychiatric:        Mood and Affect: Mood normal.      ED Treatments / Results  Labs (all labs ordered are listed, but only abnormal results are displayed) Labs Reviewed  CBC WITH DIFFERENTIAL/PLATELET - Abnormal; Notable for the following components:      Result Value   Hemoglobin 11.9 (*)    HCT 33.7 (*)    MCV 74.6 (*)    All other components within normal limits  COMPREHENSIVE METABOLIC PANEL - Abnormal; Notable for the following components:   Sodium 130 (*)    Glucose, Bld 166 (*)     Calcium 8.8 (*)    Albumin 2.7 (*)    AST 101 (*)    ALT 100 (*)    Alkaline Phosphatase 284 (*)    All other components within normal limits  URINALYSIS, ROUTINE W REFLEX MICROSCOPIC    EKG EKG Interpretation  Date/Time:  Monday December 14 2018 12:33:54 EST Ventricular Rate:  62 PR Interval:  138 QRS Duration: 76 QT Interval:  400 QTC Calculation: 406 R Axis:   -10 Text Interpretation:  Normal sinus rhythm Anterior infarct , age undetermined Abnormal ECG No significant change since last tracing Confirmed by Quintella Reichert [phone]) on 12/14/2018  12:47:49 PM   Radiology Ct Head Wo Contrast  Result Date: 12/14/2018 CLINICAL DATA:  Nausea, vomiting, headache EXAM: CT HEAD WITHOUT CONTRAST TECHNIQUE: Contiguous axial images were obtained from the base of the skull through the vertex without intravenous contrast. COMPARISON:  12/14/2016 FINDINGS: Brain: Diffuse mixed density right subdural hematoma with scattered areas of old and acute right subdural blood. There is acute right subdural blood along the midline falx on the right, and also the right cerebral hemisphere. Acute hyperdense subdural blood extends along the right tentorium. There is slight mass effect on the right frontal lobe. Subdural thickness measures 10 mm anteriorly. There is associated right-to-left midline shift measuring 4.5 mm, image 14 series 2. No definite acute infarction, mass lesion, hydrocephalus, or significant herniation. Cerebellar atrophy noted. Vascular: No hyperdense vessel. Skull: Normal. Negative for fracture or focal lesion. Sinuses/Orbits: No acute finding. Other: None. IMPRESSION: Right subdural hematoma with mixed density suggesting acute and chronic components with mass effect on the right cerebral hemisphere and leftward midline shift of 4.5 mm. No hydrocephalus or significant herniation These results were called by telephone at the time of interpretation on 12/14/2018 at 3:58 pm to Dr. Melina Copa, who verbally  acknowledged these results. Electronically Signed   By: Jerilynn Mages.  Shick M.D.   On: 12/14/2018 15:59    Procedures Procedures (including critical care time)  Medications Ordered in ED Medications  sodium chloride 0.9 % bolus 500 mL ( Intravenous Stopped 12/14/18 1640)     Initial Impression / Assessment and Plan / ED Course  I have reviewed the triage vital signs and the nursing notes.  Pertinent labs & imaging results that were available during my care of the patient were reviewed by me and considered in my medical decision making (see chart for details).   Patient is a 73 year old female with past medical history significant for TIA currently on Plavix who presents today for confusion dizziness for the past 2 days after initiation of Bentyl.  Family reports slurred speech and confusion which has resolved and neuro exam is within normal limit.  No change from baseline with ambulation.  Vital signs initially were all within normal limits.  Patient also reported possible blood in her urine yesterday.  UA was negative for hematuria or infection.  Given dizziness, orthostatic were performed and she was positive.  Patient was given a small bolus of NS 500 mL.  CBC and CMP within normal limits for patient with improving transaminitis.  Given history of TIA (in January 2019), slurred speech and confusion head CT without contrast was ordered and showed a right subdural hematoma with mass effect and a leftward midline shift of 4.5 mm. Upon further questioning, patient reports a head trauma about 3 weeks ago without loss of consciousness. Neurosurgery was consulted.  Our initial thought was confusion and dizziness were secondary to Bentyl initiation with some dehydration contributing, however imaging findings are concerning for mass effect with midline shift as likely etiology for patient's symptoms.   After discussion with neurosurgery (Dr. Ellene Route), they  recommended discontinuing Bentyl and Plavix.  Patient  will follow-up in the outpatient setting within a week.  She will be reassessed and decision will be made to either medically manage with serial imaging with close monitoring vs evacuation.  Plan was explained to patient and family.  They verbalized understanding and are in agreement with plan. Final Clinical Impressions(s) / ED Diagnoses   Final diagnoses:  Subdural hematoma ([place])  Dizziness  Confusion    ED Discharge Orders  None       Marjie Skiff, MD 12/14/18 1649    Marjie Skiff, MD 12/15/18 Dyann Kief    Quintella Reichert, MD 12/17/18 [phone]

## 2018-12-14 NOTE — Discharge Instructions (Addendum)
Please make sur you stop taking Bentyl (dicyclomine) and Clopidogrel (Plavix).  We provided you with the neurosurgeon office , please make sure you call tomorrow and schedule an appointment within a week with Dr. Ellene Route who knows about your case in regard to your subdural hematoma.

## 2018-12-14 NOTE — ED Triage Notes (Signed)
Pt c/o feeling light headed, n/v x 4 days after starting new med for chronic diarrhea-pt NAD-steady gait

## 2018-12-14 NOTE — ED Provider Notes (Signed)
Patient signed out to me from Dr. Ralene Bathe.  73 year old female with an episode yesterday of some confusion may be some imbalance possibly with some word finding difficulties.  Currently her symptoms are improved.  There is some thought that it may be related to a new medication Bentyl that she was started for IBS.  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  Will place a consult into neurosurgery and likely will need to be admitted at [facility].  Resident physician was able to talk to neurosurgery Dr. Ellene Route who felt the patient could be discharged and follow-up with him in the office.  He had some recommendations on some medication changes.   Hayden Rasmussen, MD 12/14/18 [phone]

## 2018-12-14 NOTE — ED Notes (Signed)
ED Provider at bedside. 

## 2018-12-14 NOTE — ED Notes (Signed)
Pt and pt family given strict follow up info and reasons to return. Everyone voiced understanding. Pt neuro intact upon d/c. No complaints at this time.

## 2018-12-25 ENCOUNTER — Other Ambulatory Visit (HOSPITAL_BASED_OUTPATIENT_CLINIC_OR_DEPARTMENT_OTHER): Payer: Self-pay | Admitting: Neurological Surgery

## 2018-12-25 DIAGNOSIS — S065XAA Traumatic subdural hemorrhage with loss of consciousness status unknown, initial encounter: Secondary | ICD-10-CM

## 2018-12-25 DIAGNOSIS — S065X9A Traumatic subdural hemorrhage with loss of consciousness of unspecified duration, initial encounter: Secondary | ICD-10-CM

## 2018-12-30 ENCOUNTER — Ambulatory Visit (HOSPITAL_BASED_OUTPATIENT_CLINIC_OR_DEPARTMENT_OTHER): Payer: Medicare Other

## 2019-01-13 ENCOUNTER — Emergency Department (HOSPITAL_BASED_OUTPATIENT_CLINIC_OR_DEPARTMENT_OTHER): Payer: Medicare Other

## 2019-01-13 ENCOUNTER — Other Ambulatory Visit: Payer: Self-pay

## 2019-01-13 ENCOUNTER — Inpatient Hospital Stay (HOSPITAL_BASED_OUTPATIENT_CLINIC_OR_DEPARTMENT_OTHER)
Admission: EM | Admit: 2019-01-13 | Discharge: 2019-01-16 | DRG: 441 | Disposition: A | Discharge status: Home Health | Payer: Medicare Other | Attending: Internal Medicine | Admitting: Internal Medicine

## 2019-01-13 ENCOUNTER — Encounter (HOSPITAL_BASED_OUTPATIENT_CLINIC_OR_DEPARTMENT_OTHER): Payer: Self-pay

## 2019-01-13 DIAGNOSIS — E860 Dehydration: Secondary | ICD-10-CM

## 2019-01-13 DIAGNOSIS — K7460 Unspecified cirrhosis of liver: Secondary | ICD-10-CM | POA: Diagnosis present

## 2019-01-13 DIAGNOSIS — Z794 Long term (current) use of insulin: Secondary | ICD-10-CM

## 2019-01-13 DIAGNOSIS — E1151 Type 2 diabetes mellitus with diabetic peripheral angiopathy without gangrene: Secondary | ICD-10-CM | POA: Diagnosis present

## 2019-01-13 DIAGNOSIS — K7200 Acute and subacute hepatic failure without coma: Principal | ICD-10-CM | POA: Diagnosis present

## 2019-01-13 DIAGNOSIS — E876 Hypokalemia: Secondary | ICD-10-CM | POA: Diagnosis present

## 2019-01-13 DIAGNOSIS — S065X9A Traumatic subdural hemorrhage with loss of consciousness of unspecified duration, initial encounter: Secondary | ICD-10-CM | POA: Diagnosis not present

## 2019-01-13 DIAGNOSIS — G9341 Metabolic encephalopathy: Secondary | ICD-10-CM | POA: Diagnosis present

## 2019-01-13 DIAGNOSIS — E7800 Pure hypercholesterolemia, unspecified: Secondary | ICD-10-CM | POA: Diagnosis present

## 2019-01-13 DIAGNOSIS — R4182 Altered mental status, unspecified: Secondary | ICD-10-CM | POA: Diagnosis present

## 2019-01-13 DIAGNOSIS — Z87891 Personal history of nicotine dependence: Secondary | ICD-10-CM | POA: Diagnosis not present

## 2019-01-13 DIAGNOSIS — Z79899 Other long term (current) drug therapy: Secondary | ICD-10-CM

## 2019-01-13 DIAGNOSIS — K7581 Nonalcoholic steatohepatitis (NASH): Secondary | ICD-10-CM | POA: Diagnosis present

## 2019-01-13 DIAGNOSIS — E1159 Type 2 diabetes mellitus with other circulatory complications: Secondary | ICD-10-CM | POA: Diagnosis present

## 2019-01-13 DIAGNOSIS — Z8249 Family history of ischemic heart disease and other diseases of the circulatory system: Secondary | ICD-10-CM

## 2019-01-13 DIAGNOSIS — Z823 Family history of stroke: Secondary | ICD-10-CM | POA: Diagnosis not present

## 2019-01-13 DIAGNOSIS — E119 Type 2 diabetes mellitus without complications: Secondary | ICD-10-CM

## 2019-01-13 DIAGNOSIS — I10 Essential (primary) hypertension: Secondary | ICD-10-CM | POA: Diagnosis present

## 2019-01-13 DIAGNOSIS — E11319 Type 2 diabetes mellitus with unspecified diabetic retinopathy without macular edema: Secondary | ICD-10-CM | POA: Diagnosis present

## 2019-01-13 DIAGNOSIS — R188 Other ascites: Secondary | ICD-10-CM | POA: Diagnosis present

## 2019-01-13 DIAGNOSIS — K219 Gastro-esophageal reflux disease without esophagitis: Secondary | ICD-10-CM | POA: Diagnosis present

## 2019-01-13 DIAGNOSIS — Z9989 Dependence on other enabling machines and devices: Secondary | ICD-10-CM

## 2019-01-13 DIAGNOSIS — E7220 Disorder of urea cycle metabolism, unspecified: Secondary | ICD-10-CM

## 2019-01-13 DIAGNOSIS — R627 Adult failure to thrive: Secondary | ICD-10-CM | POA: Diagnosis present

## 2019-01-13 DIAGNOSIS — D869 Sarcoidosis, unspecified: Secondary | ICD-10-CM | POA: Diagnosis present

## 2019-01-13 DIAGNOSIS — K7682 Hepatic encephalopathy: Secondary | ICD-10-CM | POA: Diagnosis present

## 2019-01-13 DIAGNOSIS — G4733 Obstructive sleep apnea (adult) (pediatric): Secondary | ICD-10-CM | POA: Diagnosis present

## 2019-01-13 DIAGNOSIS — R410 Disorientation, unspecified: Secondary | ICD-10-CM

## 2019-01-13 DIAGNOSIS — S065XAA Traumatic subdural hemorrhage with loss of consciousness status unknown, initial encounter: Secondary | ICD-10-CM | POA: Diagnosis present

## 2019-01-13 DIAGNOSIS — K652 Spontaneous bacterial peritonitis: Secondary | ICD-10-CM | POA: Diagnosis present

## 2019-01-13 DIAGNOSIS — K7290 Hepatic failure, unspecified without coma: Secondary | ICD-10-CM | POA: Diagnosis present

## 2019-01-13 DIAGNOSIS — Z8673 Personal history of transient ischemic attack (TIA), and cerebral infarction without residual deficits: Secondary | ICD-10-CM

## 2019-01-13 DIAGNOSIS — R799 Abnormal finding of blood chemistry, unspecified: Secondary | ICD-10-CM

## 2019-01-13 DIAGNOSIS — IMO0001 Reserved for inherently not codable concepts without codable children: Secondary | ICD-10-CM

## 2019-01-13 HISTORY — DX: Type 2 diabetes mellitus with unspecified diabetic retinopathy without macular edema: E11.319

## 2019-01-13 HISTORY — DX: Traumatic subdural hemorrhage with loss of consciousness of unspecified duration, initial encounter: S06.5X9A

## 2019-01-13 HISTORY — DX: Traumatic subdural hemorrhage with loss of consciousness status unknown, initial encounter: S06.5XAA

## 2019-01-13 HISTORY — DX: Pure hypercholesterolemia, unspecified: E78.00

## 2019-01-13 LAB — CBC WITH DIFFERENTIAL/PLATELET
Abs Immature Granulocytes: 0.08 10*3/uL — ABNORMAL HIGH (ref 0.00–0.07)
BASOS PCT: 0 %
Basophils Absolute: 0 10*3/uL (ref 0.0–0.1)
EOS ABS: 0 10*3/uL (ref 0.0–0.5)
Eosinophils Relative: 0 %
HCT: 32.9 % — ABNORMAL LOW (ref 36.0–46.0)
Hemoglobin: 11.8 g/dL — ABNORMAL LOW (ref 12.0–15.0)
Immature Granulocytes: 1 %
LYMPHS ABS: 1.3 10*3/uL (ref 0.7–4.0)
LYMPHS PCT: 7 %
MCH: 26.7 pg (ref 26.0–34.0)
MCHC: 35.9 g/dL (ref 30.0–36.0)
MCV: 74.4 fL — ABNORMAL LOW (ref 80.0–100.0)
MONO ABS: 1.5 10*3/uL — AB (ref 0.1–1.0)
Monocytes Relative: 9 %
NEUTROS ABS: 14.7 10*3/uL — AB (ref 1.7–7.7)
Neutrophils Relative %: 83 %
PLATELETS: 158 10*3/uL (ref 150–400)
RBC: 4.42 MIL/uL (ref 3.87–5.11)
RDW: 14.4 % (ref 11.5–15.5)
WBC: 17.6 10*3/uL — AB (ref 4.0–10.5)
nRBC: 0 % (ref 0.0–0.2)

## 2019-01-13 LAB — COMPREHENSIVE METABOLIC PANEL
ALT: 77 U/L — ABNORMAL HIGH (ref 0–44)
ANION GAP: 9 (ref 5–15)
AST: 75 U/L — AB (ref 15–41)
Albumin: 2.8 g/dL — ABNORMAL LOW (ref 3.5–5.0)
Alkaline Phosphatase: 234 U/L — ABNORMAL HIGH (ref 38–126)
BUN: 34 mg/dL — AB (ref 8–23)
CHLORIDE: 100 mmol/L (ref 98–111)
CO2: 23 mmol/L (ref 22–32)
Calcium: 8.4 mg/dL — ABNORMAL LOW (ref 8.9–10.3)
Creatinine, Ser: 0.76 mg/dL (ref 0.44–1.00)
Glucose, Bld: 182 mg/dL — ABNORMAL HIGH (ref 70–99)
POTASSIUM: 3.8 mmol/L (ref 3.5–5.1)
Sodium: 132 mmol/L — ABNORMAL LOW (ref 135–145)
Total Bilirubin: 2 mg/dL — ABNORMAL HIGH (ref 0.3–1.2)
Total Protein: 7.1 g/dL (ref 6.5–8.1)

## 2019-01-13 LAB — URINALYSIS, ROUTINE W REFLEX MICROSCOPIC
BILIRUBIN URINE: NEGATIVE
Glucose, UA: NEGATIVE mg/dL
HGB URINE DIPSTICK: NEGATIVE
Ketones, ur: NEGATIVE mg/dL
Leukocytes,Ua: NEGATIVE
Nitrite: NEGATIVE
Protein, ur: NEGATIVE mg/dL
SPECIFIC GRAVITY, URINE: 1.02 (ref 1.005–1.030)
pH: 5.5 (ref 5.0–8.0)

## 2019-01-13 LAB — PROTIME-INR
INR: 1.41
PROTHROMBIN TIME: 17.1 s — AB (ref 11.4–15.2)

## 2019-01-13 LAB — AMMONIA: Ammonia: 67 umol/L — ABNORMAL HIGH (ref 9–35)

## 2019-01-13 MED ORDER — SODIUM CHLORIDE 0.9 % IV BOLUS
500.0000 mL | Freq: Once | INTRAVENOUS | Status: AC
  Administered 2019-01-13: 500 mL via INTRAVENOUS

## 2019-01-13 MED ORDER — ONDANSETRON HCL 4 MG/2ML IJ SOLN
4.0000 mg | Freq: Once | INTRAMUSCULAR | Status: AC
  Administered 2019-01-13: 4 mg via INTRAVENOUS
  Filled 2019-01-13: qty 2

## 2019-01-13 MED ORDER — LACTULOSE 10 GM/15ML PO SOLN
20.0000 g | Freq: Once | ORAL | Status: AC
  Administered 2019-01-13: 20 g via ORAL
  Filled 2019-01-13: qty 30

## 2019-01-13 MED ORDER — SODIUM CHLORIDE 0.9 % IV SOLN
1.0000 g | Freq: Once | INTRAVENOUS | Status: AC
  Administered 2019-01-13: 1 g via INTRAVENOUS
  Filled 2019-01-13: qty 10

## 2019-01-13 NOTE — ED Notes (Signed)
pts family at desk stating pt is nauseated

## 2019-01-13 NOTE — Progress Notes (Signed)
Patient admitted to room 3W-09.  Patient alert, no complaints  Patient orientated to room, bed alarm, and importance of calling for assistance before getting out of bed.  RN paged [facility]

## 2019-01-13 NOTE — ED Notes (Addendum)
Unsuccessful IV attempt x 1. Pt tolerated well. Crystal RT to attempt.

## 2019-01-13 NOTE — Significant Event (Signed)
73yo with PMH relevant for NASH cirrhosis, HTN, T2DM, and recent subdural hematoma diagnosed on 12/14/2018 with no intervention coming in with confusion and mild dehydration. Labs only notable for elevated WBC and elevated LFTs (around baseline). Ammonia elevated. Getting lactulose, IVF and empiric ceftriaxone. No ascites by exam. Repeat head CT shows subdural hematomas are improving from 16monthago. Admitted to stepdown d/t potential for deterioration. [facility]

## 2019-01-13 NOTE — ED Provider Notes (Signed)
Patient be admitted to hospital for mild dehydration and elevated ammonia level.  A dose of lactulose was given here.  Blood cultures will be obtained.  Hospitalist requesting a dose of Rocephin as well.  Her subdural looks improved as compared to her prior CTs.  No neurosurgical intervention nor consultation necessary.  I spoke with patient and family.  They are agreeable to inpatient work-up given her increased confusion over the past several days and generalized lethargy.  She has no ascites on examination.  Results for orders placed or performed during the hospital encounter of 01/13/19  Urinalysis, Routine w reflex microscopic  Result Value Ref Range   Color, Urine YELLOW YELLOW   APPearance CLEAR CLEAR   Specific Gravity, Urine 1.020 1.005 - 1.030   pH 5.5 5.0 - 8.0   Glucose, UA NEGATIVE NEGATIVE mg/dL   Hgb urine dipstick NEGATIVE NEGATIVE   Bilirubin Urine NEGATIVE NEGATIVE   Ketones, ur NEGATIVE NEGATIVE mg/dL   Protein, ur NEGATIVE NEGATIVE mg/dL   Nitrite NEGATIVE NEGATIVE   Leukocytes,Ua NEGATIVE NEGATIVE  CBC with Differential  Result Value Ref Range   WBC 17.6 (H) 4.0 - 10.5 K/uL   RBC 4.42 3.87 - 5.11 MIL/uL   Hemoglobin 11.8 (L) 12.0 - 15.0 g/dL   HCT 32.9 (L) 36.0 - 46.0 %   MCV 74.4 (L) 80.0 - 100.0 fL   MCH 26.7 26.0 - 34.0 pg   MCHC 35.9 30.0 - 36.0 g/dL   RDW 14.4 11.5 - 15.5 %   Platelets 158 150 - 400 K/uL   nRBC 0.0 0.0 - 0.2 %   Neutrophils Relative % 83 %   Neutro Abs 14.7 (H) 1.7 - 7.7 K/uL   Lymphocytes Relative 7 %   Lymphs Abs 1.3 0.7 - 4.0 K/uL   Monocytes Relative 9 %   Monocytes Absolute 1.5 (H) 0.1 - 1.0 K/uL   Eosinophils Relative 0 %   Eosinophils Absolute 0.0 0.0 - 0.5 K/uL   Basophils Relative 0 %   Basophils Absolute 0.0 0.0 - 0.1 K/uL   Immature Granulocytes 1 %   Abs Immature Granulocytes 0.08 (H) 0.00 - 0.07 K/uL  Comprehensive metabolic panel  Result Value Ref Range   Sodium 132 (L) 135 - 145 mmol/L   Potassium 3.8 3.5 - 5.1  mmol/L   Chloride 100 98 - 111 mmol/L   CO2 23 22 - 32 mmol/L   Glucose, Bld 182 (H) 70 - 99 mg/dL   BUN 34 (H) 8 - 23 mg/dL   Creatinine, Ser 0.76 0.44 - 1.00 mg/dL   Calcium 8.4 (L) 8.9 - 10.3 mg/dL   Total Protein 7.1 6.5 - 8.1 g/dL   Albumin 2.8 (L) 3.5 - 5.0 g/dL   AST 75 (H) 15 - 41 U/L   ALT 77 (H) 0 - 44 U/L   Alkaline Phosphatase 234 (H) 38 - 126 U/L   Total Bilirubin 2.0 (H) 0.3 - 1.2 mg/dL   GFR calc non Af Amer >60 >60 mL/min   GFR calc Af Amer >60 >60 mL/min   Anion gap 9 5 - 15  Ammonia  Result Value Ref Range   Ammonia 67 (H) 9 - 35 umol/L  Protime-INR  Result Value Ref Range   Prothrombin Time 17.1 (H) 11.4 - 15.2 seconds   INR 1.41     Dg Chest 2 View  Result Date: 01/13/2019 CLINICAL DATA:  Fatigue and confusion. EXAM: CHEST - 2 VIEW COMPARISON:  11/26/2017 FINDINGS: Artifact overlies the chest.  Heart size is normal. The patient has taken a poor inspiration. There may be mild basilar pneumonia or volume loss, particularly appreciable on the lateral view. No dense consolidation or lobar collapse. No effusion. Bony structures unremarkable. IMPRESSION: Suboptimal inspiration. Question mild basilar atelectasis or pneumonia on the lateral view. No dense consolidation or lobar collapse. No evidence of heart failure. Electronically Signed   By: Nelson Chimes M.D.   On: 01/13/2019 15:12   Ct Head Wo Contrast  Result Date: 01/13/2019 CLINICAL DATA:  Altered level of consciousness. EXAM: CT HEAD WITHOUT CONTRAST TECHNIQUE: Contiguous axial images were obtained from the base of the skull through the vertex without intravenous contrast. COMPARISON:  CT scan of December 30, 2018. FINDINGS: Brain: Ventricular size is within normal limits. Continued presence of right-sided subdural hematoma which is significantly smaller compared to prior exam, with maximal thickness of 9 mm. Right parafalcine subdural hematoma is again noted which also is significantly smaller compared to prior exam.  3 mm of right to left midline shift is noted which is not significantly changed compared to prior exam. No new hemorrhage or infarction is noted. No mass lesion is noted. Vascular: No hyperdense vessel or unexpected calcification. Skull: Normal. Negative for fracture or focal lesion. Sinuses/Orbits: No acute finding. Other: None. IMPRESSION: Continued presence of right-sided subdural hematoma as well as small right parafalcine subdural hematoma, both of which are smaller compared to prior exam. Grossly stable 3 mm of right to left midline shift is noted. No new hemorrhage or other abnormality is noted. Electronically Signed   By: Marijo Conception, M.D.   On: 01/13/2019 15:15      Jola Schmidt, MD 01/13/19 1756

## 2019-01-13 NOTE — ED Notes (Signed)
Updated pt and family to delay of bed assignment

## 2019-01-13 NOTE — ED Provider Notes (Signed)
[facility] Provider Note   CSN: [id number] Arrival date & time: 01/13/19  1308    History   Chief Complaint Chief Complaint  Patient presents with  . Fatigue    HPI Monica Pearson is a 73 y.o. female.    Level 5 caveat due to confusion. HPI Patient presents with confusion and generalized weakness.  Decreased oral intake.  Sent in by PCP to rule out dehydration.  Also has more confusion and has recent subdural hematoma.  Has been followed by Dr. Ellene Route.  Reportedly has had decreased oral intake.  Has had previous urinary tract infections but no dysuria at this time.  Reportedly has some what decreased urine output however.  No new fall.  No cough.  No fevers.  Patient's son states the patient is somewhat confused and not her normal self at this time. Past Medical History:  Diagnosis Date  . Cirrhosis ([place])   . Diabetes mellitus without complication ([place])   . Diabetic retinopathy ([place])   . Diabetic retinopathy ([place])   . GERD (gastroesophageal reflux disease)   . High cholesterol   . Hypertension   . Sarcoidosis   . Stroke ([place])   . Subdural hematoma [facility])     Patient Active Problem List   Diagnosis Date Noted  . Hyperammonemia ([place]) 01/14/2019  . Subdural hematoma ([place]) 01/14/2019  . Type 2 diabetes mellitus with vascular disease ([place]) 01/14/2019  . Acute hepatic encephalopathy 01/14/2019  . Acute metabolic encephalopathy 26/94/8546  . Hyperlipidemia 07/22/2017  . Hypertension 07/22/2017  . Obstructive sleep apnea treated with continuous positive airway pressure (CPAP) 07/22/2017  . Acute CVA (cerebrovascular accident) ([place]) 12/15/2016  . TIA (transient ischemic attack) 12/14/2016  . GERD (gastroesophageal reflux disease) 12/14/2016  . Diabetes ([place]) 12/14/2016  . No blood products 12/14/2016    Past Surgical History:  Procedure Laterality Date  . CHOLECYSTECTOMY    . KNEE SURGERY       OB History   No obstetric history on file.      Home Medications    Prior to Admission medications   Medication Sig Start Date End Date Taking? Authorizing Provider  atenolol (TENORMIN) 50 MG tablet Take 50 mg by mouth daily.   Yes [provider]  Calcium-Magnesium-Vitamin D (CALCIUM 1200+D3 PO) Take 2 tablets by mouth at bedtime.   Yes [provider]  chlorthalidone (HYGROTON) 25 MG tablet Take 25 mg by mouth daily.   Yes [provider]  ezetimibe (ZETIA) 10 MG tablet Take 10 mg by mouth every morning. 11/06/16 01/14/19 Yes [provider]  furosemide (LASIX) 20 MG tablet Take 20 mg by mouth daily. 12/03/18  Yes [provider]  glipiZIDE (GLUCOTROL XL) 10 MG 24 hr tablet Take 10 mg by mouth 2 (two) times daily.   Yes [provider]  insulin lispro (HUMALOG KWIKPEN) 100 UNIT/ML KwikPen Inject 15 Units into the skin 3 (three) times daily with meals. MAX OF 50 UNITS/DAY    Yes [provider]  liraglutide (VICTOZA) 18 MG/3ML SOPN Inject 1.8 mg into the skin at bedtime.   Yes [provider]  Magnesium 500 MG CAPS Take 500 mg by mouth at bedtime.   Yes [provider]  metFORMIN (GLUCOPHAGE) 500 MG tablet Take 500 mg by mouth 2 (two) times daily with a meal.   Yes [provider]  spironolactone (ALDACTONE) 100 MG tablet Take 1 tablet (100 mg total) by mouth daily. 11/26/17  Yes Little, Wenda Overland,  MD    Family History Family History  Problem Relation Age of Onset  . Stroke Mother   . Bladder Cancer Mother   . CAD Father   . Prostate cancer Father     Social History Social History   Tobacco Use  . Smoking status: Former Research scientist (life sciences)  . Smokeless tobacco: Never Used  Substance Use Topics  . Alcohol use: No  . Drug use: No     Allergies   Celecoxib; Rofecoxib; Ciprofloxacin; Darvon [propoxyphene]; Hydrocodone-acetaminophen; and Morphine   Review of Systems Review of Systems  Unable to perform ROS: Mental status change      Physical Exam Updated Vital Signs BP 126/60 (BP Location: Right Arm)   Pulse 82   Temp 97.9 F (36.6 C) (Oral)   Resp 17   Ht 5' 3"  (1.6 m)   Wt 78.7 kg   SpO2 100%   BMI 30.73 kg/m   Physical Exam Vitals signs and nursing note reviewed.  HENT:     Head: Atraumatic.     Mouth/Throat:     Mouth: Mucous membranes are dry.  Eyes:     Extraocular Movements: Extraocular movements intact.  Neck:     Musculoskeletal: Neck supple.  Cardiovascular:     Rate and Rhythm: Normal rate and regular rhythm.  Pulmonary:     Breath sounds: No wheezing or rhonchi.  Abdominal:     Tenderness: There is abdominal tenderness.     Comments: Upper abdominal tenderness without rebound or guarding.  No hernia palpated.  Potential fullness in upper abdomen.  Musculoskeletal:     Right lower leg: No edema.     Left lower leg: No edema.  Skin:    General: Skin is warm.  Neurological:     Mental Status: She is alert.     Comments: Awake and pleasant but some mild confusion.      ED Treatments / Results  Labs (all labs ordered are listed, but only abnormal results are displayed) Labs Reviewed  CBC WITH DIFFERENTIAL/PLATELET - Abnormal; Notable for the following components:      Result Value   WBC 17.6 (*)    Hemoglobin 11.8 (*)    HCT 32.9 (*)    MCV 74.4 (*)    Neutro Abs 14.7 (*)    Monocytes Absolute 1.5 (*)    Abs Immature Granulocytes 0.08 (*)    All other components within normal limits  COMPREHENSIVE METABOLIC PANEL - Abnormal; Notable for the following components:   Sodium 132 (*)    Glucose, Bld 182 (*)    BUN 34 (*)    Calcium 8.4 (*)    Albumin 2.8 (*)    AST 75 (*)    ALT 77 (*)    Alkaline Phosphatase 234 (*)    Total Bilirubin 2.0 (*)    All other components within normal limits  AMMONIA - Abnormal; Notable for the following components:   Ammonia 67 (*)    All other components within normal limits  PROTIME-INR - Abnormal; Notable for the following  components:   Prothrombin Time 17.1 (*)    All other components within normal limits  BASIC METABOLIC PANEL - Abnormal; Notable for the following components:   Sodium 134 (*)    Glucose, Bld 149 (*)    BUN 29 (*)    Calcium 8.2 (*)    All other components within normal limits  HEPATIC FUNCTION PANEL - Abnormal; Notable for the following components:   Total Protein 6.0 (*)  Albumin 2.1 (*)    AST 66 (*)    ALT 68 (*)    Alkaline Phosphatase 179 (*)    Total Bilirubin 1.9 (*)    Bilirubin, Direct 0.5 (*)    Indirect Bilirubin 1.4 (*)    All other components within normal limits  CBC WITH DIFFERENTIAL/PLATELET - Abnormal; Notable for the following components:   WBC 15.9 (*)    RBC 3.85 (*)    Hemoglobin 10.1 (*)    HCT 28.1 (*)    MCV 73.0 (*)    Platelets 149 (*)    Neutro Abs 12.3 (*)    Monocytes Absolute 1.6 (*)    All other components within normal limits  GLUCOSE, CAPILLARY - Abnormal; Notable for the following components:   Glucose-Capillary 145 (*)    All other components within normal limits  AMMONIA - Abnormal; Notable for the following components:   Ammonia 67 (*)    All other components within normal limits  GLUCOSE, CAPILLARY - Abnormal; Notable for the following components:   Glucose-Capillary 208 (*)    All other components within normal limits  CULTURE, BLOOD (ROUTINE X 2)  CULTURE, BLOOD (ROUTINE X 2)  URINALYSIS, ROUTINE W REFLEX MICROSCOPIC    EKG EKG Interpretation  Date/Time:  Wednesday January 13 2019 13:54:28 EST Ventricular Rate:  81 PR Interval:    QRS Duration: 78 QT Interval:  582 QTC Calculation: 676 R Axis:   -5 Text Interpretation:  Sinus rhythm Low voltage, extremity and precordial leads Anteroseptal infarct, old Prolonged QT interval Baseline wander in lead(s) V1 V5 Confirmed by Davonna Belling [phone]) on 01/13/2019 2:26:22 PM   Radiology Dg Chest 2 View  Result Date: 01/13/2019 CLINICAL DATA:  Fatigue and confusion. EXAM:  CHEST - 2 VIEW COMPARISON:  11/26/2017 FINDINGS: Artifact overlies the chest. Heart size is normal. The patient has taken a poor inspiration. There may be mild basilar pneumonia or volume loss, particularly appreciable on the lateral view. No dense consolidation or lobar collapse. No effusion. Bony structures unremarkable. IMPRESSION: Suboptimal inspiration. Question mild basilar atelectasis or pneumonia on the lateral view. No dense consolidation or lobar collapse. No evidence of heart failure. Electronically Signed   By: Nelson Chimes M.D.   On: 01/13/2019 15:12   Ct Head Wo Contrast  Result Date: 01/13/2019 CLINICAL DATA:  Altered level of consciousness. EXAM: CT HEAD WITHOUT CONTRAST TECHNIQUE: Contiguous axial images were obtained from the base of the skull through the vertex without intravenous contrast. COMPARISON:  CT scan of December 30, 2018. FINDINGS: Brain: Ventricular size is within normal limits. Continued presence of right-sided subdural hematoma which is significantly smaller compared to prior exam, with maximal thickness of 9 mm. Right parafalcine subdural hematoma is again noted which also is significantly smaller compared to prior exam. 3 mm of right to left midline shift is noted which is not significantly changed compared to prior exam. No new hemorrhage or infarction is noted. No mass lesion is noted. Vascular: No hyperdense vessel or unexpected calcification. Skull: Normal. Negative for fracture or focal lesion. Sinuses/Orbits: No acute finding. Other: None. IMPRESSION: Continued presence of right-sided subdural hematoma as well as small right parafalcine subdural hematoma, both of which are smaller compared to prior exam. Grossly stable 3 mm of right to left midline shift is noted. No new hemorrhage or other abnormality is noted. Electronically Signed   By: Marijo Conception, M.D.   On: 01/13/2019 15:15   Ir Abdomen US Limited  Result Date: 01/14/2019  CLINICAL DATA:  Cirrhosis, acute  encephalopathy EXAM: LIMITED ABDOMEN ULTRASOUND FOR ASCITES TECHNIQUE: Limited ultrasound survey for ascites was performed in all four abdominal quadrants. COMPARISON:  CT 07/15/2017 FINDINGS: There is a scant amount of scattered abdominal ascites. No significant pocket for safe paracentesis. IMPRESSION: Minimal abdominal ascites.  Paracentesis deferred. Electronically Signed   By: Lucrezia Europe M.D.   On: 01/14/2019 14:27    Procedures Procedures (including critical care time)  Medications Ordered in ED Medications  atenolol (TENORMIN) tablet 50 mg (50 mg Oral Given 01/14/19 1151)  benazepril (LOTENSIN) tablet 40 mg (40 mg Oral Given 01/14/19 1153)  ezetimibe (ZETIA) tablet 10 mg (10 mg Oral Given 01/14/19 1155)  spironolactone (ALDACTONE) tablet 100 mg (100 mg Oral Given 01/14/19 1153)  magnesium oxide (MAG-OX) tablet 400 mg (400 mg Oral Not Given 01/14/19 0602)  insulin aspart (novoLOG) injection 0-9 Units (3 Units Subcutaneous Given 01/14/19 1201)  cefTRIAXone (ROCEPHIN) 2 g in sodium chloride 0.9 % 100 mL IVPB (has no administration in time range)  lactulose (CHRONULAC) 10 GM/15ML solution 20 g (20 g Oral Given 01/14/19 1155)  hydrALAZINE (APRESOLINE) injection 5 mg (has no administration in time range)  lidocaine (XYLOCAINE) 1 % (with pres) injection (  Hold 01/14/19 1125)  sodium chloride 0.9 % bolus 500 mL (0 mLs Intravenous Stopped 01/13/19 1548)  lactulose (CHRONULAC) 10 GM/15ML solution 20 g (20 g Oral Given 01/13/19 1750)  cefTRIAXone (ROCEPHIN) 1 g in sodium chloride 0.9 % 100 mL IVPB (0 g Intravenous Stopped 01/13/19 1851)  ondansetron (ZOFRAN) injection 4 mg (4 mg Intravenous Given 01/13/19 1836)     Initial Impression / Assessment and Plan / ED Course  I have reviewed the triage vital signs and the nursing notes.  Pertinent labs & imaging results that were available during my care of the patient were reviewed by me and considered in my medical decision making (see chart for details).        Patient with mental status changes.  Recent subdural hematoma.  Likely dehydration on labs.  Care turned over to oncoming physician  Final Clinical Impressions(s) / ED Diagnoses   Final diagnoses:  Confusion  Dehydration  Hyperammonemia ([place])  Elevated BUN    ED Discharge Orders    None       Davonna Belling, MD 01/14/19 [phone]

## 2019-01-13 NOTE — ED Notes (Signed)
Pt has been stuck multiple times by multiple nurses, unable to obtain 2 blood culture sets

## 2019-01-13 NOTE — ED Notes (Signed)
Wheeled pt to restroom, she is unable to give a urine sample

## 2019-01-13 NOTE — ED Notes (Signed)
Report given to Estill Bamberg, RN on 3W bed 9 at [facility]

## 2019-01-13 NOTE — ED Triage Notes (Signed)
Pt c/o fatigue, periods of confusion, increase sleeping-c/o abd cramps but states she has IBS-NAD to triage in w/c-was sent from PCP for possible dehydration-son with pt

## 2019-01-13 NOTE — ED Notes (Signed)
Unsuccessful IV attempt x 2. Pt tolerated well.

## 2019-01-14 ENCOUNTER — Encounter (HOSPITAL_COMMUNITY): Payer: Self-pay | Admitting: Internal Medicine

## 2019-01-14 ENCOUNTER — Inpatient Hospital Stay (HOSPITAL_COMMUNITY): Payer: Medicare Other

## 2019-01-14 DIAGNOSIS — S065XAA Traumatic subdural hemorrhage with loss of consciousness status unknown, initial encounter: Secondary | ICD-10-CM | POA: Diagnosis present

## 2019-01-14 DIAGNOSIS — K7290 Hepatic failure, unspecified without coma: Secondary | ICD-10-CM | POA: Diagnosis present

## 2019-01-14 DIAGNOSIS — K7200 Acute and subacute hepatic failure without coma: Principal | ICD-10-CM

## 2019-01-14 DIAGNOSIS — E7220 Disorder of urea cycle metabolism, unspecified: Secondary | ICD-10-CM

## 2019-01-14 DIAGNOSIS — K7682 Hepatic encephalopathy: Secondary | ICD-10-CM | POA: Diagnosis present

## 2019-01-14 DIAGNOSIS — S065X9A Traumatic subdural hemorrhage with loss of consciousness of unspecified duration, initial encounter: Secondary | ICD-10-CM

## 2019-01-14 DIAGNOSIS — E1159 Type 2 diabetes mellitus with other circulatory complications: Secondary | ICD-10-CM | POA: Diagnosis present

## 2019-01-14 DIAGNOSIS — G9341 Metabolic encephalopathy: Secondary | ICD-10-CM

## 2019-01-14 LAB — CBC WITH DIFFERENTIAL/PLATELET
Abs Immature Granulocytes: 0.07 10*3/uL (ref 0.00–0.07)
Basophils Absolute: 0 10*3/uL (ref 0.0–0.1)
Basophils Relative: 0 %
EOS PCT: 0 %
Eosinophils Absolute: 0 10*3/uL (ref 0.0–0.5)
HCT: 28.1 % — ABNORMAL LOW (ref 36.0–46.0)
Hemoglobin: 10.1 g/dL — ABNORMAL LOW (ref 12.0–15.0)
Immature Granulocytes: 0 %
Lymphocytes Relative: 12 %
Lymphs Abs: 1.9 10*3/uL (ref 0.7–4.0)
MCH: 26.2 pg (ref 26.0–34.0)
MCHC: 35.9 g/dL (ref 30.0–36.0)
MCV: 73 fL — ABNORMAL LOW (ref 80.0–100.0)
Monocytes Absolute: 1.6 10*3/uL — ABNORMAL HIGH (ref 0.1–1.0)
Monocytes Relative: 10 %
Neutro Abs: 12.3 10*3/uL — ABNORMAL HIGH (ref 1.7–7.7)
Neutrophils Relative %: 78 %
Platelets: 149 10*3/uL — ABNORMAL LOW (ref 150–400)
RBC: 3.85 MIL/uL — ABNORMAL LOW (ref 3.87–5.11)
RDW: 14.3 % (ref 11.5–15.5)
WBC: 15.9 10*3/uL — ABNORMAL HIGH (ref 4.0–10.5)
nRBC: 0 % (ref 0.0–0.2)

## 2019-01-14 LAB — BASIC METABOLIC PANEL
ANION GAP: 11 (ref 5–15)
BUN: 29 mg/dL — ABNORMAL HIGH (ref 8–23)
CO2: 22 mmol/L (ref 22–32)
Calcium: 8.2 mg/dL — ABNORMAL LOW (ref 8.9–10.3)
Chloride: 101 mmol/L (ref 98–111)
Creatinine, Ser: 0.87 mg/dL (ref 0.44–1.00)
GFR calc Af Amer: >60 mL/min (ref >60)
Glucose, Bld: 149 mg/dL — ABNORMAL HIGH (ref 70–99)
Potassium: 3.6 mmol/L (ref 3.5–5.1)
Sodium: 134 mmol/L — ABNORMAL LOW (ref 135–145)

## 2019-01-14 LAB — GLUCOSE, CAPILLARY
Glucose-Capillary: 145 mg/dL — ABNORMAL HIGH (ref 70–99)
Glucose-Capillary: 208 mg/dL — ABNORMAL HIGH (ref 70–99)
Glucose-Capillary: 215 mg/dL — ABNORMAL HIGH (ref 70–99)
Glucose-Capillary: 165 mg/dL — ABNORMAL HIGH (ref 70–99)

## 2019-01-14 LAB — HEPATIC FUNCTION PANEL
ALK PHOS: 179 U/L — AB (ref 38–126)
ALT: 68 U/L — ABNORMAL HIGH (ref 0–44)
AST: 66 U/L — ABNORMAL HIGH (ref 15–41)
Albumin: 2.1 g/dL — ABNORMAL LOW (ref 3.5–5.0)
BILIRUBIN DIRECT: 0.5 mg/dL — AB (ref 0.0–0.2)
BILIRUBIN TOTAL: 1.9 mg/dL — AB (ref 0.3–1.2)
Indirect Bilirubin: 1.4 mg/dL — ABNORMAL HIGH (ref 0.3–0.9)
Total Protein: 6 g/dL — ABNORMAL LOW (ref 6.5–8.1)

## 2019-01-14 LAB — AMMONIA: Ammonia: 67 umol/L — ABNORMAL HIGH (ref 9–35)

## 2019-01-14 MED ORDER — MAGNESIUM OXIDE 400 (241.3 MG) MG PO TABS
400.0000 mg | ORAL_TABLET | Freq: Every day | ORAL | Status: DC
  Administered 2019-01-14 – 2019-01-15 (×2): 400 mg via ORAL
  Filled 2019-01-14 (×2): qty 1

## 2019-01-14 MED ORDER — SODIUM CHLORIDE 0.9 % IV SOLN
INTRAVENOUS | Status: DC | PRN
  Administered 2019-01-14 – 2019-01-16 (×2): 250 mL via INTRAVENOUS

## 2019-01-14 MED ORDER — INSULIN ASPART 100 UNIT/ML ~~LOC~~ SOLN
0.0000 [IU] | Freq: Three times a day (TID) | SUBCUTANEOUS | Status: DC
  Administered 2019-01-14: 3 [IU] via SUBCUTANEOUS
  Administered 2019-01-14: 1 [IU] via SUBCUTANEOUS
  Administered 2019-01-14: 3 [IU] via SUBCUTANEOUS
  Administered 2019-01-15: 2 [IU] via SUBCUTANEOUS
  Administered 2019-01-15: 3 [IU] via SUBCUTANEOUS
  Administered 2019-01-15: 7 [IU] via SUBCUTANEOUS
  Administered 2019-01-16 (×2): 5 [IU] via SUBCUTANEOUS

## 2019-01-14 MED ORDER — SPIRONOLACTONE 25 MG PO TABS
100.0000 mg | ORAL_TABLET | Freq: Every day | ORAL | Status: DC
  Administered 2019-01-14 – 2019-01-15 (×2): 100 mg via ORAL
  Filled 2019-01-14 (×2): qty 4

## 2019-01-14 MED ORDER — ATENOLOL 25 MG PO TABS
50.0000 mg | ORAL_TABLET | Freq: Every day | ORAL | Status: DC
  Administered 2019-01-14: 50 mg via ORAL
  Filled 2019-01-14: qty 2

## 2019-01-14 MED ORDER — LACTULOSE 10 GM/15ML PO SOLN
20.0000 g | Freq: Three times a day (TID) | ORAL | Status: DC
  Administered 2019-01-14: 20 g via ORAL
  Filled 2019-01-14 (×3): qty 30

## 2019-01-14 MED ORDER — BENAZEPRIL HCL 20 MG PO TABS
40.0000 mg | ORAL_TABLET | Freq: Every day | ORAL | Status: DC
  Administered 2019-01-14: 40 mg via ORAL
  Filled 2019-01-14: qty 2

## 2019-01-14 MED ORDER — LIDOCAINE HCL 1 % IJ SOLN
INTRAMUSCULAR | Status: AC
  Filled 2019-01-14: qty 20

## 2019-01-14 MED ORDER — EZETIMIBE 10 MG PO TABS
10.0000 mg | ORAL_TABLET | Freq: Every morning | ORAL | Status: DC
  Administered 2019-01-14 – 2019-01-16 (×3): 10 mg via ORAL
  Filled 2019-01-14 (×3): qty 1

## 2019-01-14 MED ORDER — SODIUM CHLORIDE 0.9 % IV SOLN
2.0000 g | INTRAVENOUS | Status: DC
  Administered 2019-01-14 – 2019-01-15 (×2): 2 g via INTRAVENOUS
  Filled 2019-01-14 (×3): qty 20

## 2019-01-14 MED ORDER — HYDRALAZINE HCL 20 MG/ML IJ SOLN: 5.0000 mg | INTRAMUSCULAR | Status: DC | PRN

## 2019-01-14 NOTE — H&P (Signed)
History and Physical    Monica Pearson TKP:[id number] DOB: [birth year] DOA: 01/13/2019  PCP: Jenel Lucks, PA-C  Patient coming from: Home.  Chief Complaint: Increasing confusion.  HPI: Monica Pearson is a 73 y.o. female with history of cirrhosis likely from Monica Pearson recently diagnosed, diabetes mellitus, hypertension, anemia and recent fall with subdural hematoma being followed by Dr. Ellene Route was brought to the ER after patient was found to have increasing confusion over the last few days.  Patient states this is a second visit in the last 4 days.  Family was concerned that patient is getting more weaker and confused and her primary care referred patient to the ER for concern for dehydration.  Denies any chest pain shortness of breath nausea vomiting diarrhea abdominal pain fever or chills.  No productive cough.  ED Course: In the ER CT head shows known subdural hematoma with midline shift which has not progressed and has gotten smaller than previous.  Ammonia level 67 WBC count was 17.6 alkaline phosphatase AST ALT and bilirubin were increased and there were previously 2.  Patient was given lactulose blood cultures obtained and started on ceftriaxone.  Chest x-ray shows possibility of infiltrate but patient has no cough.  UA shows nothing acute EKG shows normal sinus rhythm.  Patient admitted for acute encephalopathy likely from hepatic encephalopathy.  Review of Systems: As per HPI, rest all negative.   Past Medical History:  Diagnosis Date  . Cirrhosis ([place])   . Diabetes mellitus without complication ([place])   . Diabetic retinopathy ([place])   . Diabetic retinopathy ([place])   . GERD (gastroesophageal reflux disease)   . High cholesterol   . Hypertension   . Sarcoidosis   . Stroke ([place])   . Subdural hematoma [facility])     Past Surgical History:  Procedure Laterality Date  . CHOLECYSTECTOMY    . KNEE SURGERY       reports that she has quit smoking. She has never used smokeless tobacco.  She reports that she does not drink alcohol or use drugs.  Allergies  Allergen Reactions  . Celecoxib Swelling    Ankles swell  . Rofecoxib Swelling    Ankles swell  . Ciprofloxacin Itching  . Darvon [Propoxyphene] Nausea And Vomiting  . Hydrocodone-Acetaminophen Nausea And Vomiting  . Morphine Nausea And Vomiting    Family History  Problem Relation Age of Onset  . Stroke Mother   . Bladder Cancer Mother   . CAD Father   . Prostate cancer Father     Prior to Admission medications   Medication Sig Start Date End Date Taking? Authorizing Provider  atenolol (TENORMIN) 50 MG tablet Take 50 mg by mouth daily.    [provider]  benazepril (LOTENSIN) 40 MG tablet Take 40 mg by mouth daily.    [provider]  Calcium-Magnesium-Vitamin D (CALCIUM 1200+D3 PO) Take 2 tablets by mouth at bedtime.    [provider]  chlorthalidone (HYGROTON) 25 MG tablet Take 25 mg by mouth daily.    [provider]  ezetimibe (ZETIA) 10 MG tablet Take 10 mg by mouth every morning. 11/06/16 11/06/17  [provider]  glipiZIDE (GLUCOTROL XL) 10 MG 24 hr tablet Take 10 mg by mouth 2 (two) times daily.    [provider]  insulin lispro (HUMALOG KWIKPEN) 100 UNIT/ML KiwkPen Inject 15 Units into the skin 3 (three) times daily with meals. MAX OF 50 UNITS/DAY     [provider]  liraglutide (VICTOZA) 18  MG/3ML SOPN Inject 1.8 mg into the skin at bedtime.    [provider]  Magnesium 500 MG CAPS Take 500 mg by mouth at bedtime.    [provider]  metFORMIN (GLUCOPHAGE) 500 MG tablet Take 500 mg by mouth 2 (two) times daily with a meal.    [provider]  ondansetron (ZOFRAN ODT) 8 MG disintegrating tablet 47m ODT q4 hours prn nausea 02/23/15   DVeryl Speak MD  Propylene Glycol (SYSTANE BALANCE OP) Place 1-2 drops into both eyes 3 (three) times daily as needed (for dry eyes).    [provider]  spironolactone  (ALDACTONE) 100 MG tablet Take 1 tablet (100 mg total) by mouth daily. 11/26/17   Little, RWenda Overland MD    Physical Exam: Vitals:   01/13/19 1900 01/13/19 2000 01/13/19 2100 01/13/19 2241  BP: (!) 121/58 (!) 127/55 (!) 127/56 (!) 125/51  Pulse: 85  94 88  Resp: 20 19 19 13   Temp:    98.5 F (36.9 C)  TempSrc:    Oral  SpO2: 96% 100% 96% 99%  Weight:    78.7 kg  Height:    5' 3"  (1.6 m)      Constitutional: Moderately built and nourished. Vitals:   01/13/19 1900 01/13/19 2000 01/13/19 2100 01/13/19 2241  BP: (!) 121/58 (!) 127/55 (!) 127/56 (!) 125/51  Pulse: 85  94 88  Resp: 20 19 19 13   Temp:    98.5 F (36.9 C)  TempSrc:    Oral  SpO2: 96% 100% 96% 99%  Weight:    78.7 kg  Height:    5' 3"  (1.6 m)   Eyes: Anicteric no pallor. ENMT: No discharge from the ears eyes nose and mouth. Neck: No mass felt.  No neck rigidity. Respiratory: No rhonchi or crepitations. Cardiovascular: S1-S2 heard. Abdomen: Soft nontender bowel sounds present. Musculoskeletal: No edema.  No joint effusion. Skin: No rash. Neurologic: Alert awake oriented to her name and place and person moves all extremities 5 x 5.  At times gets confused.  Able to reacting to light. Psychiatric: Appears normal.   Labs on Admission: I have personally reviewed following labs and imaging studies  CBC: Recent Labs  Lab 01/13/19 1416  WBC 17.6*  NEUTROABS 14.7*  HGB 11.8*  HCT 32.9*  MCV 74.4*  PLT 1299  Basic Metabolic Panel: Recent Labs  Lab 01/13/19 1416  NA 132*  K 3.8  CL 100  CO2 23  GLUCOSE 182*  BUN 34*  CREATININE 0.76  CALCIUM 8.4*   GFR: Estimated Creatinine Clearance: 62.2 mL/min (by C-G formula based on SCr of 0.76 mg/dL). Liver Function Tests: Recent Labs  Lab 01/13/19 1416  AST 75*  ALT 77*  ALKPHOS 234*  BILITOT 2.0*  PROT 7.1  ALBUMIN 2.8*   No results for input(s): LIPASE, AMYLASE in the last 168 hours. Recent Labs  Lab 01/13/19 1549  AMMONIA 67*    Coagulation Profile: Recent Labs  Lab 01/13/19 1549  INR 1.41   Cardiac Enzymes: No results for input(s): CKTOTAL, CKMB, CKMBINDEX, TROPONINI in the last 168 hours. BNP (last 3 results) No results for input(s): PROBNP in the last 8760 hours. HbA1C: No results for input(s): HGBA1C in the last 72 hours. CBG: No results for input(s): GLUCAP in the last 168 hours. Lipid Profile: No results for input(s): CHOL, HDL, LDLCALC, TRIG, CHOLHDL, LDLDIRECT in the last 72 hours. Thyroid Function Tests: No results for input(s): TSH, T4TOTAL, FREET4, T3FREE, THYROIDAB in  the last 72 hours. Anemia Panel: No results for input(s): VITAMINB12, FOLATE, FERRITIN, TIBC, IRON, RETICCTPCT in the last 72 hours. Urine analysis:    Component Value Date/Time   COLORURINE YELLOW 01/13/2019 [place] 01/13/2019 1645   LABSPEC 1.020 01/13/2019 1645   PHURINE 5.5 01/13/2019 [place] 01/13/2019 1645   HGBUR NEGATIVE 01/13/2019 [place] 01/13/2019 1645   KETONESUR NEGATIVE 01/13/2019 1645   PROTEINUR NEGATIVE 01/13/2019 1645   UROBILINOGEN 0.2 02/23/2015 2000   NITRITE NEGATIVE 01/13/2019 1645   LEUKOCYTESUR NEGATIVE 01/13/2019 1645   Sepsis Labs: @LABRCNTIP (procalcitonin:4,lacticidven:4) )No results found for this or any previous visit (from the past 240 hour(s)).   Radiological Exams on Admission: Dg Chest 2 View  Result Date: 01/13/2019 CLINICAL DATA:  Fatigue and confusion. EXAM: CHEST - 2 VIEW COMPARISON:  11/26/2017 FINDINGS: Artifact overlies the chest. Heart size is normal. The patient has taken a poor inspiration. There may be mild basilar pneumonia or volume loss, particularly appreciable on the lateral view. No dense consolidation or lobar collapse. No effusion. Bony structures unremarkable. IMPRESSION: Suboptimal inspiration. Question mild basilar atelectasis or pneumonia on the lateral view. No dense consolidation or lobar collapse. No  evidence of heart failure. Electronically Signed   By: Nelson Chimes M.D.   On: 01/13/2019 15:12   Ct Head Wo Contrast  Result Date: 01/13/2019 CLINICAL DATA:  Altered level of consciousness. EXAM: CT HEAD WITHOUT CONTRAST TECHNIQUE: Contiguous axial images were obtained from the base of the skull through the vertex without intravenous contrast. COMPARISON:  CT scan of December 30, 2018. FINDINGS: Brain: Ventricular size is within normal limits. Continued presence of right-sided subdural hematoma which is significantly smaller compared to prior exam, with maximal thickness of 9 mm. Right parafalcine subdural hematoma is again noted which also is significantly smaller compared to prior exam. 3 mm of right to left midline shift is noted which is not significantly changed compared to prior exam. No new hemorrhage or infarction is noted. No mass lesion is noted. Vascular: No hyperdense vessel or unexpected calcification. Skull: Normal. Negative for fracture or focal lesion. Sinuses/Orbits: No acute finding. Other: None. IMPRESSION: Continued presence of right-sided subdural hematoma as well as small right parafalcine subdural hematoma, both of which are smaller compared to prior exam. Grossly stable 3 mm of right to left midline shift is noted. No new hemorrhage or other abnormality is noted. Electronically Signed   By: Marijo Conception, M.D.   On: 01/13/2019 15:15    EKG: Independently reviewed.  Normal sinus rhythm.  Assessment/Plan Principal Problem:   Acute metabolic encephalopathy Active Problems:   Hypertension   Obstructive sleep apnea treated with continuous positive airway pressure (CPAP)   Hyperammonemia (HCC)   Subdural hematoma (HCC)   Type 2 diabetes mellitus with vascular disease (HCC)   Acute hepatic encephalopathy    1. Acute encephalopathy likely from hepatic encephalopathy for which patient is placed on lactulose 20 mg 3 times daily recheck ammonia levels patient on empiric coverage  ceftriaxone for now.  Abdomen looks distended.  We will try to get sonogram read paracentesis to see if there is any significant ascites. 2. Subdural hematoma has been persistent with no new changes.  Patient follows up with Dr. Ellene Route.  May need to contact Dr. Ellene Route with regarding to this in the morning. 3. Diabetes mellitus type 2 we will keep patient on sliding scale coverage.  Patient states he does not take the Lantus now.  4. Hypertension on benazepril atenolol.  Will hold hydrochlorothiazide for now.  PRN IV hydralazine. 5. Cirrhosis of the liver being followed by gastroenterologist.  On spironolactone.   DVT prophylaxis: SCDs. Code Status: Full code. Family Communication: Discussed with patient. Disposition Plan: Home. Consults called: None. Admission status: Inpatient.   Rise Patience MD [facility] Pager [phone].  If 7PM-7AM, please contact night-coverage [email or web address] Password [email or web address]  01/14/2019, 1:52 AM

## 2019-01-14 NOTE — Progress Notes (Addendum)
PROGRESS NOTE  Monica Pearson WUJ:[id number] DOB: 1946-09-10 DOA: 01/13/2019 PCP: Jenel Lucks, PA-C  Brief History   Monica Pearson is a 73 y.o. female with history of cirrhosis likely from [name] recently diagnosed, diabetes mellitus, hypertension, anemia and recent fall with subdural hematoma being followed by Dr. Ellene Route was brought to the ER after patient was found to have increasing confusion over the last few days.  Patient states this is a second visit in the last 4 days.  Family was concerned that patient is getting more weaker and confused and her primary care referred patient to the ER for concern for dehydration.  Denies any chest pain shortness of breath nausea vomiting diarrhea abdominal pain fever or chills.  No productive cough.  ED Course: In the ER CT head shows known subdural hematoma with midline shift which has not progressed and has gotten smaller than previous.  Ammonia level 67 WBC count was 17.6 alkaline phosphatase AST ALT and bilirubin were increased and there were previously 2.  Patient was given lactulose blood cultures obtained and started on ceftriaxone.  Chest x-ray shows possibility of infiltrate but patient has no cough.  UA shows nothing acute EKG shows normal sinus rhythm.  Patient admitted for acute encephalopathy likely from hepatic encephalopathy.  The patient was admitted to a medical bed. She received lactulose. She has had multiple loose BM's since then. IR was consulted for paracentesis.   A & P  Assessment/Plan Principal Problem:   Acute metabolic encephalopathy Active Problems:   Hypertension   Obstructive sleep apnea treated with continuous positive airway pressure (CPAP)   Hyperammonemia (HCC)   Subdural hematoma (HCC)   Type 2 diabetes mellitus with vascular disease (HCC)   Acute hepatic encephalopathy    1. Acute encephalopathy likely from hepatic encephalopathy for which patient is placed on lactulose 20 mg 3 times daily recheck ammonia  levels patient on empiric coverage ceftriaxone for now.  Abdomen looks distended.  We will try to get sonogram read paracentesis to see if there is any significant ascites. 2. Subdural hematoma has been persistent with no new changes.  Patient follows up with Dr. Ellene Route.  May need to contact Dr. Ellene Route with regarding to this in the morning. 3. Diabetes mellitus type 2 we will keep patient on sliding scale coverage.  Patient states he does not take the Lantus now. 4. Hypertension on benazepril atenolol.  Will hold hydrochlorothiazide for now.  PRN IV hydralazine. 5. Cirrhosis of the liver being followed by gastroenterologist.  On spironolactone.   DVT prophylaxis: SCDs. Code Status: Full code. Family Communication: Discussed with patient. Disposition Plan: Home. Consults called: None. Admission status: Inpatient.   DVT prophylaxis: SCD's Code Status: Full Code Family Communication: Husband at bedside. All questions answered to the best of my ability. Disposition Plan: Home   Krista Godsil, DO [facility] Direct contact: see [email or web address]  7PM-7AM contact night coverage as above 01/14/2019, 2:24 PM  LOS: 1 day   Consultants  . Neurosurgery,   Procedures  . EEG  Antibiotics  . Rocephin  Interval History/Subjective  The patient was admitted to a stepdown bed. She received lactulose to address her encephalopathy. Interventional radiology was consulted to performe a paracentesis. Neurosurgery was consulted to follow this patient who had a recent SDH. They feel that the SDH is stable and the patient may continue to follow up with Dr. Ellene Route for neurosurgery as outpatient.  Objective   Vitals:  Vitals:   01/14/19 0748 01/14/19 1200  BP: Marland Kitchen)  109/54 126/60  Pulse: 69 82  Resp: (!) 22 17  Temp: 98.2 F (36.8 C) 97.9 F (36.6 C)  SpO2: 100% 100%    Exam:  Constitutional:  . The patient is awake but lethargic. She states that she thinks that she was confused when she  presented last night, but she isn't sure. Her spouse who is in the room states that she was very confused and disoriented. He states that she is better, but still disoriented. Continue lactulose. Respiratory:  . No wheezes, rales, or rhonchi. No tactile fremitus. . No increased work of breathing. Cardiovascular:  . Regular rate and rhythm. No murmur, ectopy, or gallup. . No LE extremity edema   . Normal pedal pulses Abdomen:  . Abdomen is distended. Positive fluid wave . No hernias, masses, or organomegaly are appreciated. . Normoactive bowel sounds.  Musculoskeletal:  . No cyanosis or clubbing. Positive for 3+ pitting edema bilaterally. Skin:  . No rashes, lesions, ulcers . palpation of skin: no induration or nodules Neurologic:  . CN 2-12 intact . Patient is moving all extremities. Psychiatric:  . Mental status o Mood, affect appropriate o Not oriented. Not sure why she is in the hospital.  I have personally reviewed the following:   Today's Data  . BMP and CBC, Vitals   Scheduled Meds: . atenolol  50 mg Oral Daily  . benazepril  40 mg Oral Daily  . ezetimibe  10 mg Oral q morning - 10a  . insulin aspart  0-9 Units Subcutaneous TID WC  . lactulose  20 g Oral TID  . lidocaine      . magnesium oxide  400 mg Oral QHS  . spironolactone  100 mg Oral Daily   Continuous Infusions: . cefTRIAXone (ROCEPHIN)  IV      Principal Problem:   Acute metabolic encephalopathy Active Problems:   Hypertension   Obstructive sleep apnea treated with continuous positive airway pressure (CPAP)   Hyperammonemia (HCC)   Subdural hematoma (HCC)   Type 2 diabetes mellitus with vascular disease ([place])   Acute hepatic encephalopathy   LOS: 1 day

## 2019-01-14 NOTE — Progress Notes (Signed)
Subjective: Patient reports no change over night, continues to have right leg weakness unchanged.   Objective: Vital signs in last 24 hours: Temp:  [97.7 F (36.5 C)-99.5 F (37.5 C)] 98.3 F (36.8 C) (02/20 0324) Pulse Rate:  [78-94] 78 (02/20 0324) Resp:  [12-20] 12 (02/20 0324) BP: (121-132)/(50-62) 132/55 (02/20 0324) SpO2:  [96 %-100 %] 99 % (02/20 0324) Weight:  [78.7 kg-80.3 kg] 78.7 kg (02/20 0324)  Intake/Output from previous day: 02/19 0701 - 02/20 0700 In: 600 [IV Piggyback:600] Out: 600 [Urine:600] Intake/Output this shift: No intake/output data recorded.  Neurologic: Grossly normal RLE 3/5  Lab Results: Lab Results  Component Value Date   WBC 15.9 (H) 01/14/2019   HGB 10.1 (L) 01/14/2019   HCT 28.1 (L) 01/14/2019   MCV 73.0 (L) 01/14/2019   PLT 149 (L) 01/14/2019   Lab Results  Component Value Date   INR 1.41 01/13/2019   BMET Lab Results  Component Value Date   NA 134 (L) 01/14/2019   K 3.6 01/14/2019   CL 101 01/14/2019   CO2 22 01/14/2019   GLUCOSE 149 (H) 01/14/2019   BUN 29 (H) 01/14/2019   CREATININE 0.87 01/14/2019   CALCIUM 8.2 (L) 01/14/2019    Studies/Results: Dg Chest 2 View  Result Date: 01/13/2019 CLINICAL DATA:  Fatigue and confusion. EXAM: CHEST - 2 VIEW COMPARISON:  11/26/2017 FINDINGS: Artifact overlies the chest. Heart size is normal. The patient has taken a poor inspiration. There may be mild basilar pneumonia or volume loss, particularly appreciable on the lateral view. No dense consolidation or lobar collapse. No effusion. Bony structures unremarkable. IMPRESSION: Suboptimal inspiration. Question mild basilar atelectasis or pneumonia on the lateral view. No dense consolidation or lobar collapse. No evidence of heart failure. Electronically Signed   By: Nelson Chimes M.D.   On: 01/13/2019 15:12   Ct Head Wo Contrast  Result Date: 01/13/2019 CLINICAL DATA:  Altered level of consciousness. EXAM: CT HEAD WITHOUT CONTRAST TECHNIQUE:  Contiguous axial images were obtained from the base of the skull through the vertex without intravenous contrast. COMPARISON:  CT scan of December 30, 2018. FINDINGS: Brain: Ventricular size is within normal limits. Continued presence of right-sided subdural hematoma which is significantly smaller compared to prior exam, with maximal thickness of 9 mm. Right parafalcine subdural hematoma is again noted which also is significantly smaller compared to prior exam. 3 mm of right to left midline shift is noted which is not significantly changed compared to prior exam. No new hemorrhage or infarction is noted. No mass lesion is noted. Vascular: No hyperdense vessel or unexpected calcification. Skull: Normal. Negative for fracture or focal lesion. Sinuses/Orbits: No acute finding. Other: None. IMPRESSION: Continued presence of right-sided subdural hematoma as well as small right parafalcine subdural hematoma, both of which are smaller compared to prior exam. Grossly stable 3 mm of right to left midline shift is noted. No new hemorrhage or other abnormality is noted. Electronically Signed   By: Marijo Conception, M.D.   On: 01/13/2019 15:15    Assessment/Plan: 73 year old with known SDH being followed by Dr. Ellene Route outpatient. CT from yesterday shows stable right sided SDH which appears to be smaller than previous scan. No new recommendations. Follow up with Dr. Ellene Route in the office.    LOS: 1 day    Ocie Cornfield [facility] 01/14/2019, 7:34 AM

## 2019-01-14 NOTE — Progress Notes (Signed)
EEG completed, results pending. 

## 2019-01-14 NOTE — Procedures (Signed)
History: 73 year old female being evaluated for metabolic encephalopathy  Sedation: None  Technique: This is a 21 channel routine scalp EEG performed at the bedside with bipolar and monopolar montages arranged in accordance to the international 10/20 system of electrode placement. One channel was dedicated to EKG recording.    Background: The background consists of predominantly of theta range activity with a posterior dominant rhythm of 7 to 8 Hz.  There is also mild generalized irregular delta activity intruding into the background.  Sleep is not recorded.  There were no epileptiform discharges seen.  Photic stimulation: Physiologic driving is not performed  EEG Abnormalities: 1) generalized irregular slow activity 2) slow posterior dominant rhythm  Clinical Interpretation: This EEG is consistent with a mild generalized nonspecific cerebral dysfunction (encephalopathy). There was no seizure or seizure predisposition recorded on this study. Please note that lack of epileptiform activity on EEG does not preclude the possibility of epilepsy.   Roland Rack, MD [facility] [phone]  If 7pm- 7am, please page neurology on call as listed in [place].

## 2019-01-14 NOTE — Plan of Care (Signed)
Patient stable, discussed POC with patient and family, agreeable with plan, denies question/concerns at this time.

## 2019-01-15 DIAGNOSIS — Z794 Long term (current) use of insulin: Secondary | ICD-10-CM

## 2019-01-15 DIAGNOSIS — Z9989 Dependence on other enabling machines and devices: Secondary | ICD-10-CM

## 2019-01-15 DIAGNOSIS — K7581 Nonalcoholic steatohepatitis (NASH): Secondary | ICD-10-CM

## 2019-01-15 DIAGNOSIS — K7460 Unspecified cirrhosis of liver: Secondary | ICD-10-CM

## 2019-01-15 DIAGNOSIS — E876 Hypokalemia: Secondary | ICD-10-CM

## 2019-01-15 DIAGNOSIS — E119 Type 2 diabetes mellitus without complications: Secondary | ICD-10-CM

## 2019-01-15 DIAGNOSIS — G4733 Obstructive sleep apnea (adult) (pediatric): Secondary | ICD-10-CM

## 2019-01-15 LAB — CBC WITH DIFFERENTIAL/PLATELET
Abs Immature Granulocytes: 0.02 10*3/uL (ref 0.00–0.07)
Basophils Absolute: 0 10*3/uL (ref 0.0–0.1)
Basophils Relative: 0 %
Eosinophils Absolute: 0.2 10*3/uL (ref 0.0–0.5)
Eosinophils Relative: 2 %
HCT: 28.2 % — ABNORMAL LOW (ref 36.0–46.0)
Hemoglobin: 10.5 g/dL — ABNORMAL LOW (ref 12.0–15.0)
Immature Granulocytes: 0 %
Lymphocytes Relative: 20 %
Lymphs Abs: 1.8 10*3/uL (ref 0.7–4.0)
MCH: 26.9 pg (ref 26.0–34.0)
MCHC: 37.2 g/dL — ABNORMAL HIGH (ref 30.0–36.0)
MCV: 72.1 fL — ABNORMAL LOW (ref 80.0–100.0)
Monocytes Absolute: 1.2 10*3/uL — ABNORMAL HIGH (ref 0.1–1.0)
Monocytes Relative: 14 %
Neutro Abs: 5.7 10*3/uL (ref 1.7–7.7)
Neutrophils Relative %: 64 %
Platelets: 173 10*3/uL (ref 150–400)
RBC: 3.91 MIL/uL (ref 3.87–5.11)
RDW: 14.1 % (ref 11.5–15.5)
WBC: 8.9 10*3/uL (ref 4.0–10.5)
nRBC: 0 % (ref 0.0–0.2)

## 2019-01-15 LAB — COMPREHENSIVE METABOLIC PANEL
ALT: 62 U/L — ABNORMAL HIGH (ref 0–44)
AST: 60 U/L — ABNORMAL HIGH (ref 15–41)
Albumin: 2 g/dL — ABNORMAL LOW (ref 3.5–5.0)
Alkaline Phosphatase: 162 U/L — ABNORMAL HIGH (ref 38–126)
Anion gap: 9 (ref 5–15)
BUN: 40 mg/dL — ABNORMAL HIGH (ref 8–23)
CALCIUM: 8.2 mg/dL — AB (ref 8.9–10.3)
CO2: 23 mmol/L (ref 22–32)
Chloride: 102 mmol/L (ref 98–111)
Creatinine, Ser: 1.08 mg/dL — ABNORMAL HIGH (ref 0.44–1.00)
GFR calc non Af Amer: 51 mL/min — ABNORMAL LOW (ref >60)
GFR, EST AFRICAN AMERICAN: 59 mL/min — AB (ref >60)
Glucose, Bld: 182 mg/dL — ABNORMAL HIGH (ref 70–99)
Potassium: 3.3 mmol/L — ABNORMAL LOW (ref 3.5–5.1)
SODIUM: 134 mmol/L — AB (ref 135–145)
Total Bilirubin: 0.8 mg/dL (ref 0.3–1.2)
Total Protein: 5.7 g/dL — ABNORMAL LOW (ref 6.5–8.1)

## 2019-01-15 LAB — GLUCOSE, CAPILLARY
Glucose-Capillary: 199 mg/dL — ABNORMAL HIGH (ref 70–99)
Glucose-Capillary: 272 mg/dL — ABNORMAL HIGH (ref 70–99)
Glucose-Capillary: 316 mg/dL — ABNORMAL HIGH (ref 70–99)
Glucose-Capillary: 323 mg/dL — ABNORMAL HIGH (ref 70–99)

## 2019-01-15 MED ORDER — RIFAXIMIN 550 MG PO TABS
550.0000 mg | ORAL_TABLET | Freq: Two times a day (BID) | ORAL | Status: DC
  Administered 2019-01-15 – 2019-01-16 (×2): 550 mg via ORAL
  Filled 2019-01-15 (×2): qty 1

## 2019-01-15 MED ORDER — ATENOLOL 25 MG PO TABS: 12.5000 mg | ORAL_TABLET | Freq: Every day | ORAL | Status: DC

## 2019-01-15 MED ORDER — SPIRONOLACTONE 25 MG PO TABS
50.0000 mg | ORAL_TABLET | Freq: Every day | ORAL | Status: DC
  Administered 2019-01-16: 50 mg via ORAL
  Filled 2019-01-15: qty 2

## 2019-01-15 MED ORDER — POTASSIUM CHLORIDE CRYS ER 20 MEQ PO TBCR
40.0000 meq | EXTENDED_RELEASE_TABLET | Freq: Once | ORAL | Status: AC
  Administered 2019-01-15: 40 meq via ORAL
  Filled 2019-01-15: qty 2

## 2019-01-15 MED ORDER — PROPRANOLOL HCL 10 MG PO TABS
10.0000 mg | ORAL_TABLET | Freq: Two times a day (BID) | ORAL | Status: DC
  Filled 2019-01-15: qty 1

## 2019-01-15 MED ORDER — FUROSEMIDE 20 MG PO TABS
20.0000 mg | ORAL_TABLET | Freq: Once | ORAL | Status: AC
  Administered 2019-01-15: 20 mg via ORAL
  Filled 2019-01-15: qty 1

## 2019-01-15 MED ORDER — LACTULOSE 10 GM/15ML PO SOLN
10.0000 g | Freq: Every day | ORAL | Status: DC
  Administered 2019-01-16: 10 g via ORAL
  Filled 2019-01-15: qty 30

## 2019-01-15 NOTE — Progress Notes (Addendum)
PROGRESS NOTE  Monica Pearson EQA:[id number] DOB: May 26, 1946 DOA: 01/13/2019 PCP: Jenel Lucks, PA-C  HPI/Recap of past 24 hours:  Feeling much better, confusion has almost resolved, denies pain Remain weak but improved No fever bp low normal  She does not like to take lactulose because of the diarrhea  Assessment/Plan: Principal Problem:   Acute metabolic encephalopathy Active Problems:   Hypertension   Obstructive sleep apnea treated with continuous positive airway pressure (CPAP)   Hyperammonemia (HCC)   Subdural hematoma (HCC)   Type 2 diabetes mellitus with vascular disease ([place])   Acute hepatic encephalopathy  NASH cirrhosis/ mild ascites/hepatic encephalopathy/possible SBP -she presented with confusion and ab pain with leukocytosis, ab [place] minimal ascites no safe pocket for paracentesis, blood culture no growth, ua no unremarkable, CT head does show know SDH but no new findings, EEG "This EEG is consistent with a mild generalized nonspecific cerebral dysfunction (encephalopathy). There was no seizure or seizure predisposition recorded on this study." -she improved on rocephin, leukocytosis normalized, will continue rocephin today, plan to finish a course of abx, she needs to follow up with GI Dr Tamala Julian to discuss sbp prophylaxis ( she is allergic cipro), bactrim ds daily could be a choice -Decrease lactulose, goal of bm 3-4 times per 24hrs, confusion resolved, start rifaximin  -change atenolol to propranolol, adjust lasix/spironolactone dose now on 59m /528m( bp low normal)   H/o HTN -Low normal bp, adjust bp meds -Hold lisinopril for now, decrease lasix/spironolactone dose, change atenolol to low dose propranolol -continue to adjust bp meds, patient is advised to check blood pressure at home, bring in record for pcp to review and further adjust bp meds  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 H/o SDH after falls in 11/2018 Followed by neurosurgery Dr ElsEllene RouteOAS on cpap Body mass index is 30.5 kg/m.   FTT: reports lives by herself recent falls resulting SDH Will get PT eval  Code Status: full  Family Communication: patient   Disposition Plan: home in 1-2 days , pending clinical improvement and PT eval, will likely benefit from PT and RN   Consultants:  Neurosurgery   Procedures:  Attempt us [place]ided paracentesis, not enough fluids to tap  Antibiotics:  rocephin   Objective: BP (!) 101/57   Pulse 73   Temp 98.1 F (36.7 C) (Oral)   Resp 13   Ht 5' 3"  (1.6 m)   Wt 78.1 kg   SpO2 97%   BMI 30.50 kg/m   Intake/Output Summary (Last 24 hours) at 01/15/2019 0929 Last data filed at 01/14/2019 2300 Gross per 24 hour  Intake 560 ml  Output -  Net 560 ml   Filed Weights   01/13/19 2241 01/14/19 0324 01/15/19 0412  Weight: 78.7 kg 78.7 kg 78.1 kg    Exam: Patient is examined daily including today on 01/15/2019, exams remain the same as of yesterday  except that has changed    General:  NAD, appear weak  Cardiovascular: RRR  Respiratory: CTABL  Abdomen: Soft/ND/NT, positive BS  Musculoskeletal: No Edema  Neuro: alert, oriented   Data Reviewed: Basic Metabolic Panel: Recent Labs  Lab 01/13/19 1416 01/14/19 0530 01/15/19 0428  NA 132* 134* 134*  K 3.8 3.6 3.3*  CL 100 101 102  CO2 23 22 23   GLUCOSE 182* 149* 182*  BUN 34* 29* 40*  CREATININE 0.76 0.87 1.08*   CALCIUM 8.4* 8.2* 8.2*   Liver Function Tests: Recent Labs  Lab 01/13/19 1416 01/14/19 0530 01/15/19 0428  AST 75* 66* 60*  ALT 77* 68* 62*  ALKPHOS 234* 179* 162*  BILITOT 2.0* 1.9* 0.8  PROT 7.1 6.0* 5.7*  ALBUMIN 2.8* 2.1* 2.0*   No results for input(s): LIPASE, AMYLASE in the last 168 hours. Recent Labs  Lab 01/13/19 1549 01/14/19 0852  AMMONIA 67* 67*   CBC: Recent Labs  Lab 01/13/19 1416 01/14/19 0530 01/15/19 0428  WBC 17.6* 15.9* 8.9  NEUTROABS 14.7* 12.3* 5.7  HGB 11.8* 10.1* 10.5*  HCT 32.9* 28.1* 28.2*  MCV 74.4* 73.0* 72.1*  PLT 158 149* 173   Cardiac Enzymes:   No results for input(s): CKTOTAL, CKMB, CKMBINDEX, TROPONINI in the last 168 hours. BNP (last 3 results) No results for input(s): BNP in the last 8760 hours.  ProBNP (last 3 results) No results for input(s): PROBNP in the last 8760 hours.  CBG: Recent Labs  Lab 01/14/19 0604 01/14/19 1134 01/14/19 1613 01/14/19 2134 01/15/19 0608  GLUCAP 145* 208* 215* 165* 199*    Recent Results (from the past 240 hour(s))  Blood culture (routine x 2)     Status: None (Preliminary result)   Collection Time: 01/13/19  6:15 PM  Result Value Ref Range Status   Specimen Description BLOOD RIGHT ANTECUBITAL  Final   Special Requests   Final    BOTTLES DRAWN AEROBIC AND ANAEROBIC Blood Culture adequate volume Performed at [facility], [address] [place], [place] [address]    Culture NO GROWTH 2 DAYS  Final   Report Status PENDING  Incomplete     Studies: Ir Abdomen US Limited  Result Date: 01/14/2019 CLINICAL DATA:  Cirrhosis, acute encephalopathy EXAM: LIMITED ABDOMEN ULTRASOUND FOR ASCITES TECHNIQUE: Limited ultrasound survey for ascites was performed in all four abdominal quadrants. COMPARISON:  CT 07/15/2017 FINDINGS: There is a scant amount of scattered abdominal ascites. No significant pocket for safe paracentesis. IMPRESSION: Minimal abdominal ascites.  Paracentesis  deferred. Electronically Signed   By: Lucrezia Europe M.D.   On: 01/14/2019 14:27    Scheduled Meds: . atenolol  50 mg Oral Daily  . benazepril  40 mg Oral Daily  . ezetimibe  10 mg Oral q morning - 10a  . insulin aspart  0-9 Units Subcutaneous TID WC  . lactulose  20 g Oral TID  . magnesium oxide  400 mg Oral QHS  . potassium chloride  40 mEq Oral Once  . spironolactone  100 mg Oral Daily    Continuous Infusions: . sodium chloride 250 mL (01/14/19 1908)  . cefTRIAXone (ROCEPHIN)  IV 2 g (01/14/19 1900)     Time spent: 59mns I have personally reviewed and interpreted on  01/15/2019 daily labs, tele strips, imagings as discussed above under date review session and assessment and plans.  I reviewed all nursing notes, pharmacy notes, consultant notes,  vitals, pertinent old records  I have discussed plan of  care as described above with RN , patient  on 01/15/2019   Florencia Reasons MD, PhD  [facility] Pager [phone]. If 7PM-7AM, please contact night-coverage at [email or web address], password [facility] 01/15/2019, 9:29 AM  LOS: 2 days

## 2019-01-15 NOTE — Evaluation (Signed)
Physical Therapy Evaluation Patient Details Name: Monica Pearson MRN: [id number] DOB: [birth year] Today's Date: 01/15/2019   History of Present Illness  Patient is a 73 y/o female presenting to the ED on 01/13/2019 with primary complaints of increasing confusion. CT head shows known subdural hematoma with midline shift which has not progressed and has gotten smaller than previous. Patient admitted for acute encephalopathy likely from hepatic encephalopathy.    Clinical Impression  Patient admitted with the above listed diagnosis. Patient reports Mod I with mobility prior to admission. Patient today performing transfers and gait with RW with min guard throughout. Does require come verbal cueing for safety and obstacle navigation. Will recommend HHPT at discharge. PT to follow acutely.     Follow Up Recommendations Home health PT;Supervision - Intermittent    Equipment Recommendations  None recommended by PT    Recommendations for Other Services       Precautions / Restrictions Precautions Precautions: Fall Restrictions Weight Bearing Restrictions: No      Mobility  Bed Mobility Overal bed mobility: Modified Independent                Transfers Overall transfer level: Needs assistance Equipment used: Rolling walker (2 wheeled) Transfers: Sit to/from [employer] Transfers Sit to Stand: Min guard Stand pivot transfers: Min guard       General transfer comment: for safety  Ambulation/Gait Ambulation/Gait assistance: Min guard Gait Distance (Feet): 250 Feet Assistive device: Rolling walker (2 wheeled) Gait Pattern/deviations: Step-through pattern;Decreased stride length;Trunk flexed Gait velocity: decreased   General Gait Details: slow steady pace of gait; min guard for safety with cueing for obstacle navigation, no LOB  Stairs            Wheelchair Mobility    Modified Rankin (Stroke Patients Only)       Balance Overall balance assessment: Mild  deficits observed, not formally tested                                           Pertinent Vitals/Pain Pain Assessment: No/denies pain    Home Living Family/patient expects to be discharged to:: Private residence Living Arrangements: Alone Available Help at Discharge: Family;Available PRN/intermittently Type of Home: House Home Access: Stairs to enter Entrance Stairs-Rails: Right Entrance Stairs-Number of Steps: 8 Home Layout: One level Home Equipment: Walker - 2 wheels      Prior Function Level of Independence: Independent with assistive device(s)         Comments: had been using RW due to weakness     Hand Dominance        Extremity/Trunk Assessment   Upper Extremity Assessment Upper Extremity Assessment: Defer to OT evaluation    Lower Extremity Assessment Lower Extremity Assessment: RLE deficits/detail;LLE deficits/detail RLE Deficits / Details: R hip grossly 3/5 LLE Deficits / Details: L LE grossly 5/5    Cervical / Trunk Assessment Cervical / Trunk Assessment: Normal  Communication   Communication: No difficulties  Cognition Arousal/Alertness: Awake/alert Behavior During Therapy: WFL for tasks assessed/performed Overall Cognitive Status: Within Functional Limits for tasks assessed                                        General Comments General comments (skin integrity, edema, etc.): family present and supportive    Exercises  Assessment/Plan    PT Assessment Patient needs continued PT services  PT Problem List Decreased strength;Decreased activity tolerance;Decreased balance;Decreased mobility;Decreased knowledge of use of DME;Decreased safety awareness       PT Treatment Interventions DME instruction;Gait training;Stair training;Functional mobility training;Therapeutic activities;Therapeutic exercise;Balance training;Patient/family education    PT Goals (Current goals can be found in the Care Plan  section)  Acute Rehab PT Goals Patient Stated Goal: return home tomorrow PT Goal Formulation: With patient Time For Goal Achievement: 01/22/19 Potential to Achieve Goals: Good    Frequency Min 3X/week   Barriers to discharge        Co-evaluation               AM-PAC PT "6 Clicks" Mobility  Outcome Measure Help needed turning from your back to your side while in a flat bed without using bedrails?: None Help needed moving from lying on your back to sitting on the side of a flat bed without using bedrails?: A Little Help needed moving to and from a bed to a chair (including a wheelchair)?: A Little Help needed standing up from a chair using your arms (e.g., wheelchair or bedside chair)?: A Little Help needed to walk in hospital room?: A Little Help needed climbing 3-5 steps with a railing? : A Lot 6 Click Score: 18    End of Session Equipment Utilized During Treatment: Gait belt Activity Tolerance: Patient tolerated treatment well Patient left: in chair;with call bell/phone within reach;with chair alarm set;with family/visitor present Nurse Communication: Mobility status PT Visit Diagnosis: Unsteadiness on feet (R26.81);Other abnormalities of gait and mobility (R26.89);Muscle weakness (generalized) (M62.81)    Time: 0479-9872 PT Time Calculation (min) (ACUTE ONLY): 26 min   Charges:   PT Evaluation $PT Eval Moderate Complexity: 1 Mod PT Treatments $Gait Training: 8-22 mins         Lanney Gins, PT, DPT Supplemental Physical Therapist 01/15/19 12:23 PM Pager: [phone] Office: [phone]

## 2019-01-15 NOTE — Progress Notes (Addendum)
Inpatient Diabetes Program Recommendations  AACE/ADA: New Consensus Statement on Inpatient Glycemic Control (2015)  Target Ranges:  Prepandial:   less than 140 mg/dL      Peak postprandial:   less than 180 mg/dL (1-2 hours)      Critically ill patients:  140 - 180 mg/dL   Review of Glycemic Control  Diabetes history: DM 2  Outpatient Diabetes medications: Glipizide 10 mg BID, Metformin 500 mg BID, Victoza 1.8 mg Daily, Humalog 15 units tid with meals Current orders for Inpatient glycemic control: Novolog 0-9 units tid  Inpatient Diabetes Program Recommendations:    Patient on multiple medications at home for DM. Consider increasing Correction scale to Novolog Moderate 0-15 units tid and add hs scale 0-5 units.  Thanks,  Tama Headings RN, MSN, BC-ADM Inpatient Diabetes Coordinator Team Pager [phone] (8a-5p)

## 2019-01-15 NOTE — Care Management Note (Signed)
Case Management Note  Patient Details  Name: Monica Pearson MRN: [id number] Date of Birth: 22-Aug-1946  Subjective/Objective:    Pt admitted with acute metabolic encephalopathy. She is from home alone but has family that can check in on her. DME at home: walker, cane, 3 in 1 No issues with home meds.  Pt cant drive initially. She states family and friends can provide transportation.                Action/Plan: PT recommending [place] services. CM provided her choice and she selected [facility]. MD please place F2F and [place] orders.  Pt has all needed DME and has transportation when medically ready for d/c.   Expected Discharge Date:                  Expected Discharge Plan:  [place]  In-House Referral:     Discharge planning Services  CM Consult  Post Acute Care Choice:  Home Health Choice offered to:  Patient  DME Arranged:    DME Agency:     HH Arranged:    [place] Agency:  [place]  Status of Service:  In process, will continue to follow  If discussed at Long Length of Stay Meetings, dates discussed:    Additional Comments:  Pollie Friar, RN 01/15/2019, 3:58 PM

## 2019-01-16 DIAGNOSIS — R188 Other ascites: Secondary | ICD-10-CM

## 2019-01-16 LAB — COMPREHENSIVE METABOLIC PANEL
ALT: 64 U/L — ABNORMAL HIGH (ref 0–44)
AST: 63 U/L — ABNORMAL HIGH (ref 15–41)
Albumin: 2.1 g/dL — ABNORMAL LOW (ref 3.5–5.0)
Alkaline Phosphatase: 182 U/L — ABNORMAL HIGH (ref 38–126)
Anion gap: 9 (ref 5–15)
BUN: 46 mg/dL — ABNORMAL HIGH (ref 8–23)
CO2: 23 mmol/L (ref 22–32)
Calcium: 8.1 mg/dL — ABNORMAL LOW (ref 8.9–10.3)
Chloride: 98 mmol/L (ref 98–111)
Creatinine, Ser: 1.19 mg/dL — ABNORMAL HIGH (ref 0.44–1.00)
GFR calc non Af Amer: 45 mL/min — ABNORMAL LOW (ref >60)
GFR, EST AFRICAN AMERICAN: 52 mL/min — AB (ref >60)
Glucose, Bld: 254 mg/dL — ABNORMAL HIGH (ref 70–99)
POTASSIUM: 3.9 mmol/L (ref 3.5–5.1)
Sodium: 130 mmol/L — ABNORMAL LOW (ref 135–145)
TOTAL PROTEIN: 5.8 g/dL — AB (ref 6.5–8.1)
Total Bilirubin: 0.6 mg/dL (ref 0.3–1.2)

## 2019-01-16 LAB — HEMOGLOBIN A1C
Hgb A1c MFr Bld: 7.1 % — ABNORMAL HIGH (ref 4.8–5.6)
Mean Plasma Glucose: 157.07 mg/dL

## 2019-01-16 LAB — GLUCOSE, CAPILLARY
Glucose-Capillary: 256 mg/dL — ABNORMAL HIGH (ref 70–99)
Glucose-Capillary: 274 mg/dL — ABNORMAL HIGH (ref 70–99)

## 2019-01-16 LAB — TSH: TSH: 1.15 u[IU]/mL (ref 0.350–4.500)

## 2019-01-16 LAB — MAGNESIUM: MAGNESIUM: 1.6 mg/dL — AB (ref 1.7–2.4)

## 2019-01-16 MED ORDER — SPIRONOLACTONE 50 MG PO TABS: 50.0000 mg | ORAL_TABLET | Freq: Every day | ORAL | 0 refills | Status: DC

## 2019-01-16 MED ORDER — SULFAMETHOXAZOLE-TRIMETHOPRIM 800-160 MG PO TABS: 1.0000 | ORAL_TABLET | Freq: Two times a day (BID) | ORAL | 0 refills | Status: AC

## 2019-01-16 MED ORDER — MAGNESIUM SULFATE 2 GM/50ML IV SOLN
2.0000 g | Freq: Once | INTRAVENOUS | Status: AC
  Administered 2019-01-16: 2 g via INTRAVENOUS
  Filled 2019-01-16: qty 50

## 2019-01-16 MED ORDER — PROPRANOLOL HCL 10 MG PO TABS: 10.0000 mg | ORAL_TABLET | Freq: Two times a day (BID) | ORAL | 0 refills | Status: DC

## 2019-01-16 MED ORDER — RIFAXIMIN 550 MG PO TABS: 550.0000 mg | ORAL_TABLET | Freq: Two times a day (BID) | ORAL | 0 refills | Status: AC

## 2019-01-16 MED ORDER — LACTULOSE 10 GM/15ML PO SOLN: 10.0000 g | Freq: Two times a day (BID) | ORAL | 0 refills | Status: AC

## 2019-01-16 NOTE — Care Management (Signed)
Notified [facility] that patient will DC today

## 2019-01-16 NOTE — Progress Notes (Signed)
Pt given discharged summary and discharged home via family as transportation

## 2019-01-16 NOTE — Progress Notes (Signed)
Physical Therapy Treatment Patient Details Name: Monica Pearson MRN: [id number] DOB: 09/20/1946 Today's Date: 01/16/2019    History of Present Illness Patient is a 73 y/o female presenting to the ED on 01/13/2019 with primary complaints of increasing confusion. CT head shows known subdural hematoma with midline shift which has not progressed and has gotten smaller than previous. Patient admitted for acute encephalopathy likely from hepatic encephalopathy.    PT Comments    Pt making steady progress with functional mobility and tolerated stair training this session with min guard and cueing for safety. Pt would continue to benefit from skilled physical therapy services at this time while admitted and after d/c to address the below listed limitations in order to improve overall safety and independence with functional mobility.    Follow Up Recommendations  Home health PT;Supervision - Intermittent     Equipment Recommendations  None recommended by PT    Recommendations for Other Services       Precautions / Restrictions Precautions Precautions: Fall Restrictions Weight Bearing Restrictions: No    Mobility  Bed Mobility               General bed mobility comments: pt OOB in recliner chair upon arrival  Transfers Overall transfer level: Needs assistance Equipment used: Rolling walker (2 wheeled) Transfers: Sit to/from Stand Sit to Stand: Supervision         General transfer comment: good technique, supervision for safety  Ambulation/Gait Ambulation/Gait assistance: Min guard Gait Distance (Feet): 200 Feet Assistive device: Rolling walker (2 wheeled) Gait Pattern/deviations: Step-through pattern;Decreased stride length;Trunk flexed Gait velocity: decreased   General Gait Details: slow steady pace of gait; min guard for safety with cueing for obstacle navigation, no LOB   Stairs Stairs: Yes Stairs assistance: Min guard Stair Management: Two rails;Step to  pattern;Forwards Number of Stairs: 3 General stair comments: cueing for safety; pt very anxious and fearful of falling   Wheelchair Mobility    Modified Rankin (Stroke Patients Only) Modified Rankin (Stroke Patients Only) Pre-Morbid Rankin Score: Slight disability Modified Rankin: Moderate disability     Balance Overall balance assessment: Needs assistance Sitting-balance support: Feet supported Sitting balance-Leahy Scale: Good     Standing balance support: Single extremity supported;Bilateral upper extremity supported Standing balance-Leahy Scale: Poor                              Cognition Arousal/Alertness: Awake/alert Behavior During Therapy: WFL for tasks assessed/performed Overall Cognitive Status: Within Functional Limits for tasks assessed                                        Exercises      General Comments        Pertinent Vitals/Pain Pain Assessment: No/denies pain    Home Living                      Prior Function            PT Goals (current goals can now be found in the care plan section) Acute Rehab PT Goals PT Goal Formulation: With patient Time For Goal Achievement: 01/22/19 Potential to Achieve Goals: Good Progress towards PT goals: Progressing toward goals    Frequency    Min 3X/week      PT Plan Current plan remains appropriate    Co-evaluation  AM-PAC PT "6 Clicks" Mobility   Outcome Measure  Help needed turning from your back to your side while in a flat bed without using bedrails?: None Help needed moving from lying on your back to sitting on the side of a flat bed without using bedrails?: None Help needed moving to and from a bed to a chair (including a wheelchair)?: None Help needed standing up from a chair using your arms (e.g., wheelchair or bedside chair)?: None Help needed to walk in hospital room?: A Little Help needed climbing 3-5 steps with a railing? : A  Little 6 Click Score: 22    End of Session Equipment Utilized During Treatment: Gait belt Activity Tolerance: Patient tolerated treatment well Patient left: in chair;with call bell/phone within reach;with chair alarm set Nurse Communication: Mobility status PT Visit Diagnosis: Unsteadiness on feet (R26.81);Other abnormalities of gait and mobility (R26.89);Muscle weakness (generalized) (M62.81)     Time: 1000-1024 PT Time Calculation (min) (ACUTE ONLY): 24 min  Charges:  $Gait Training: 23-37 mins                     Sherie Don, [place], DPT  Acute Rehabilitation Services Pager [phone] Office [place] 01/16/2019, 11:36 AM

## 2019-01-16 NOTE — Discharge Summary (Signed)
Discharge Summary  Monica Pearson JKK:[id number] DOB: November 13, 1946  PCP: Monica Lucks, PA-C  Admit date: 01/13/2019 Discharge date: 01/16/2019  Time spent: 12mns, more than 50% time spent on coordination of care.  Recommendations for Outpatient Follow-up:  1. F/u with PCP within a week  for hospital discharge follow up, repeat cbc/bmp at follow up. 2. F/u with GI Dr STamala Julianon 3/10 for cirrhosis /ascites/hepatic encephalopathy 3. F/u with endocrinology  4. Home health  5. Patient is advised not to drive until release by her doctors at follow up appointment  Discharge Diagnoses:  Active Hospital Problems   Diagnosis Date Noted  . Acute metabolic encephalopathy 071/69/6789 . Hypokalemia   . Liver cirrhosis secondary to NASH ([facility]   . Hyperammonemia ([facility] 01/14/2019  . Subdural hematoma ([facility] 01/14/2019  . Type 2 diabetes mellitus with vascular disease ([facility] 01/14/2019  . Acute hepatic encephalopathy 01/14/2019  . Obstructive sleep apnea treated with continuous positive airway pressure (CPAP) 07/22/2017  . Hypertension 07/22/2017  . Insulin dependent diabetes mellitus ([facility] 12/14/2016    Resolved Hospital Problems  No resolved problems to display.    Discharge Condition: stable  Diet recommendation: heart healthy/carb modified  Filed Weights   01/14/19 0324 01/15/19 0412 01/16/19 0500  Weight: 78.7 kg 78.1 kg 79.1 kg    History of present illness:  PCP: OJenel Lucks PA-C  Patient coming from: Home.  Chief Complaint: Increasing confusion.  HPI: DJearlene Bridwellis a 73y.o. female with history of cirrhosis likely from NKarlene Linemanrecently diagnosed, diabetes mellitus, hypertension, anemia and recent fall with subdural hematoma being followed by Dr. EEllene Routewas brought to the ER after patient was found to have increasing confusion over the last few days.  Patient states this is a second visit in the last 4 days.  Family was concerned that patient is getting more  weaker and confused and her primary care referred patient to the ER for concern for dehydration.  Denies any chest pain shortness of breath nausea vomiting diarrhea abdominal pain fever or chills.  No productive cough.  ED Course: In the ER CT head shows known subdural hematoma with midline shift which has not progressed and has gotten smaller than previous.  Ammonia level 67 WBC count was 17.6 alkaline phosphatase AST ALT and bilirubin were increased and there were previously 2.  Patient was given lactulose blood cultures obtained and started on ceftriaxone.  Chest x-ray shows possibility of infiltrate but patient has no cough.  UA shows nothing acute EKG shows normal sinus rhythm.  Patient admitted for acute encephalopathy likely from hepatic encephalopathy.  Hospital Course:  Principal Problem:   Acute metabolic encephalopathy Active Problems:   Insulin dependent diabetes mellitus (HCC)   Hypertension   Obstructive sleep apnea treated with continuous positive airway pressure (CPAP)   Hyperammonemia (HCC)   Subdural hematoma (HCC)   Type 2 diabetes mellitus with vascular disease (HCC)   Acute hepatic encephalopathy   Hypokalemia   Liver cirrhosis secondary to NASH (HCC)   NASH cirrhosis/ mild ascites/hepatic encephalopathy/possible SBP -she presented with confusion and ab pain with leukocytosis, she has no fever - ab uKoreawith minimal ascites no safe pocket for paracentesis, blood culture no growth, ua no unremarkable - CT head does show know SDH but no new findings - EEG "ThisEEG is consistent with a mild generalized nonspecific cerebral dysfunction (encephalopathy). There was no seizure or seizure predisposition recorded on this study." -she improved on rocephin, leukocytosis normalized, no ab pain at discharge,  she is discharged on bactrim DS BId to finish abx treatment course, she is to follow up with GI Dr Tamala Julian on 3/10 to discuss sbp prophylaxis ( she is allergic cipro), bactrim ds  daily could be a choice. -she is discharged on lactulose, rifaximin. goal of bm 3-4 times per 24hrs, confusion resolved at discharge -she is discharged on propranolol/spironolactone (74m), lasix 270m   H/o HTN -Low normal bp, adjust bp meds - decrease lasix/spironolactone dose, change atenolol to low dose propranolol -patient is advised to check blood pressure at home, bring in record for pcp to review for further  bp meds adjustement.  H/o IBS after colonoscopy and cdiff after colonoscopy [id number] Report 70lbs weight loss in the last 47m7monthue to chronic diarrhea  Hypokalemia/hypomagnesemia: Replace k and mag Repeat lab at hospital discharge follow up.  Insulin dependent DM2 a1c 7.1 Home meds metformin, glipizide  Held in the hospital, resumed at discharge. 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 H/o SDH after falls in 11/2018 Followed by neurosurgery Dr ElsnEllene RouteAS on cpap Body mass index is 30.5 kg/m.   FTT: reports lives by herself recent falls resulting SDH  PT eval  Code Status: full  Family Communication: patient   Disposition Plan: home with home health   Consultants:  Neurosurgery   Procedures:  Attempt us gKoreaded paracentesis, not enough fluids to tap  Antibiotics:  rocephin   Discharge Exam: BP (!) 120/49   Pulse 84   Temp 97.6 F (36.4 C) (Oral)   Resp 20   Ht 5' 3"  (1.6 m)   Wt 79.1 kg   SpO2 100%   BMI 30.89 kg/m   General: NAD, aaox3 Cardiovascular: RRR Respiratory: CTABL Ab: mild distension,  nontender, + bs Extremity: no edema  Discharge Instructions You were cared for by a hospitalist during your hospital stay. If you have any questions about your discharge medications or the care you received while you were in the hospital after you are discharged, you can call the unit and asked to speak with the hospitalist on call if the hospitalist that took care of you is not available. Once you are discharged, your primary care physician will handle any further medical issues. Please note that NO REFILLS for any discharge medications will be authorized once you are discharged, as it is imperative that you return to your primary care physician (or establish a relationship with a primary care physician if you do not have one) for your aftercare needs so that they can reassess your need for medications and monitor your lab values.  Discharge Instructions    Diet - low sodium heart healthy   Complete by:  As directed    Carb modified   Discharge instructions   Complete by:  As directed    Please check your blood pressure twice a day, bring in record for your doctor to review, pcp to continue adjust your blood pressure medication if needed. Please check your blood glucose at home, bring in  record for your doctor to review for diabetes medication adjustment if needed. Goal of bowel movement: 3-4 BM daily.   Increase activity slowly   Complete by:  As directed    Please donot drive until cleared by your doctors at follow up appointment.     Allergies as of 01/16/2019      Reactions   Celecoxib Swelling   Ankles swell   Rofecoxib Swelling   Ankles swell   Ciprofloxacin Itching   Darvon [propoxyphene] Nausea And Vomiting   Hydrocodone-acetaminophen Nausea And Vomiting   Morphine Nausea And Vomiting      Medication List    STOP taking these medications   atenolol 50 MG tablet Commonly known as:  TENORMIN   chlorthalidone 25 MG tablet Commonly known as:  HYGROTON     TAKE these  medications   CALCIUM 1200+D3 PO Take 2 tablets by mouth at bedtime.   ezetimibe 10 MG tablet Commonly known as:  ZETIA Take 10 mg by mouth every morning.   furosemide 20 MG tablet Commonly known as:  LASIX Take 20 mg by mouth daily.   glipiZIDE 10 MG 24 hr tablet Commonly known as:  GLUCOTROL XL Take 10 mg by mouth 2 (two) times daily.   HUMALOG KWIKPEN 100 UNIT/ML KwikPen Generic drug:  insulin lispro Inject 15 Units into the skin 3 (three) times daily with meals. MAX OF 50 UNITS/DAY   lactulose 10 GM/15ML solution Commonly known as:  CHRONULAC Take 15 mLs (10 g total) by mouth 2 (two) times daily.   Magnesium 500 MG Caps Take 500 mg by mouth at bedtime.   metFORMIN 500 MG tablet Commonly known as:  GLUCOPHAGE Take 500 mg by mouth 2 (two) times daily with a meal.   propranolol 10 MG tablet Commonly known as:  INDERAL Take 1 tablet (10 mg total) by mouth 2 (two) times daily.   rifaximin 550 MG Tabs tablet Commonly known as:  XIFAXAN Take 1 tablet (550 mg total) by mouth 2 (two) times daily for 30 days.   spironolactone 50 MG tablet Commonly known as:  ALDACTONE Take 1 tablet (50 mg total) by mouth daily for 30 days. What changed:    medication strength  how much to take   sulfamethoxazole-trimethoprim 800-160 MG tablet Commonly known as:  BACTRIM DS,SEPTRA DS Take 1 tablet by mouth 2 (two) times daily for 6 days.   VICTOZA 18 MG/3ML Sopn Generic drug:  liraglutide Inject 1.8 mg into the skin at bedtime.      Allergies  Allergen Reactions  . Celecoxib Swelling    Ankles swell  . Rofecoxib Swelling    Ankles swell  . Ciprofloxacin Itching  . Darvon [Propoxyphene] Nausea And Vomiting  . Hydrocodone-Acetaminophen Nausea And Vomiting  . Morphine Nausea And Vomiting   Follow-up Information    Azalia Bilis, MD Follow up in 1 week(s).   Specialty:  Gastroenterology Why:  hospital discharge follow up for cirrhosis Contact information: [place] [address] [phone]        Monica Lucks, PA-C Follow up in 1 week(s).   Specialty:  Internal Medicine Why:  hospital discharge follow up, please check your blood pressure at home, bring in blood pressure record for your doctor to review to further adjust blood pressure medications. Contact information: [place] [address] [phone]            The results of significant diagnostics from this hospitalization (  including imaging, microbiology, ancillary and laboratory) are listed below for reference.    Significant Diagnostic Studies: Dg Chest 2 View  Result Date: 01/13/2019 CLINICAL DATA:  Fatigue and confusion. EXAM: CHEST - 2 VIEW COMPARISON:  11/26/2017 FINDINGS: Artifact overlies the chest. Heart size is normal. The patient has taken a poor inspiration. There may be mild basilar pneumonia or volume loss, particularly appreciable on the lateral view. No dense consolidation or lobar collapse. No effusion. Bony structures unremarkable. IMPRESSION: Suboptimal inspiration. Question mild basilar atelectasis or pneumonia on the lateral view. No dense consolidation or lobar collapse. No evidence of heart failure. Electronically Signed   By: Nelson Chimes M.D.   On: 01/13/2019 15:12   Ct Head Wo Contrast  Result Date: 01/13/2019 CLINICAL DATA:  Altered level of consciousness. EXAM: CT HEAD WITHOUT CONTRAST TECHNIQUE: Contiguous axial images were obtained from the base of the skull through the vertex without intravenous contrast. COMPARISON:  CT scan of December 30, 2018. FINDINGS: Brain: Ventricular size is within normal limits. Continued presence of right-sided subdural hematoma which is significantly smaller compared to prior exam, with maximal thickness of 9 mm. Right parafalcine subdural hematoma is again noted which also is significantly smaller compared to prior exam. 3 mm of right to left midline shift is noted which is  not significantly changed compared to prior exam. No new hemorrhage or infarction is noted. No mass lesion is noted. Vascular: No hyperdense vessel or unexpected calcification. Skull: Normal. Negative for fracture or focal lesion. Sinuses/Orbits: No acute finding. Other: None. IMPRESSION: Continued presence of right-sided subdural hematoma as well as small right parafalcine subdural hematoma, both of which are smaller compared to prior exam. Grossly stable 3 mm of right to left midline shift is noted. No new hemorrhage or other abnormality is noted. Electronically Signed   By: Marijo Conception, M.D.   On: 01/13/2019 15:15   Ir Abdomen US Limited  Result Date: 01/14/2019 CLINICAL DATA:  Cirrhosis, acute encephalopathy EXAM: LIMITED ABDOMEN ULTRASOUND FOR ASCITES TECHNIQUE: Limited ultrasound survey for ascites was performed in all four abdominal quadrants. COMPARISON:  CT 07/15/2017 FINDINGS: There is a scant amount of scattered abdominal ascites. No significant pocket for safe paracentesis. IMPRESSION: Minimal abdominal ascites.  Paracentesis deferred. Electronically Signed   By: Lucrezia Europe M.D.   On: 01/14/2019 14:27    Microbiology: Recent Results (from the past 240 hour(s))  Blood culture (routine x 2)     Status: None (Preliminary result)   Collection Time: 01/13/19  6:15 PM  Result Value Ref Range Status   Specimen Description BLOOD RIGHT ANTECUBITAL  Final   Special Requests   Final    BOTTLES DRAWN AEROBIC AND ANAEROBIC Blood Culture adequate volume Performed at [facility], [address] [place], [place] [address]    Culture NO GROWTH 2 DAYS  Final   Report Status PENDING  Incomplete     Labs: Basic Metabolic Panel: Recent Labs  Lab 01/13/19 1416 01/14/19 0530 01/15/19 0428 01/16/19 0549  NA 132* 134* 134* 130*  K 3.8 3.6 3.3* 3.9  CL 100 101 102 98  CO2 23 22 23 23   GLUCOSE 182* 149* 182* 254*  BUN 34* 29* 40* 46*  CREATININE 0.76 0.87 1.08* 1.19*  CALCIUM  8.4* 8.2* 8.2* 8.1*  MG  --   --   --  1.6*   Liver Function Tests: Recent Labs  Lab 01/13/19 1416 01/14/19 0530 01/15/19 0428 01/16/19 0549  AST 75* 66* 60*  63*  ALT 77* 68* 62* 64*  ALKPHOS 234* 179* 162* 182*  BILITOT 2.0* 1.9* 0.8 0.6  PROT 7.1 6.0* 5.7* 5.8*  ALBUMIN 2.8* 2.1* 2.0* 2.1*   No results for input(s): LIPASE, AMYLASE in the last 168 hours. Recent Labs  Lab 01/13/19 1549 01/14/19 0852  AMMONIA 67* 67*   CBC: Recent Labs  Lab 01/13/19 1416 01/14/19 0530 01/15/19 0428  WBC 17.6* 15.9* 8.9  NEUTROABS 14.7* 12.3* 5.7  HGB 11.8* 10.1* 10.5*  HCT 32.9* 28.1* 28.2*  MCV 74.4* 73.0* 72.1*  PLT 158 149* 173   Cardiac Enzymes: No results for input(s): CKTOTAL, CKMB, CKMBINDEX, TROPONINI in the last 168 hours. BNP: BNP (last 3 results) No results for input(s): BNP in the last 8760 hours.  ProBNP (last 3 results) No results for input(s): PROBNP in the last 8760 hours.  CBG: Recent Labs  Lab 01/15/19 0608 01/15/19 1113 01/15/19 1722 01/15/19 2128 01/16/19 0614  GLUCAP 199* 272* 316* 323* 256*       Signed:  Florencia Reasons MD, PhD  [facility] 01/16/2019, 9:59 AM

## 2019-01-18 LAB — CULTURE, BLOOD (ROUTINE X 2)
Culture: NO GROWTH
Special Requests: ADEQUATE

## 2019-03-02 ENCOUNTER — Other Ambulatory Visit: Payer: Self-pay

## 2019-03-02 ENCOUNTER — Emergency Department (HOSPITAL_BASED_OUTPATIENT_CLINIC_OR_DEPARTMENT_OTHER)
Admission: EM | Admit: 2019-03-02 | Discharge: 2019-03-02 | Disposition: A | Discharge status: Home / Self Care | Payer: Medicare Other | Attending: Emergency Medicine | Admitting: Emergency Medicine

## 2019-03-02 ENCOUNTER — Emergency Department (HOSPITAL_BASED_OUTPATIENT_CLINIC_OR_DEPARTMENT_OTHER): Payer: Medicare Other

## 2019-03-02 DIAGNOSIS — I10 Essential (primary) hypertension: Secondary | ICD-10-CM | POA: Insufficient documentation

## 2019-03-02 DIAGNOSIS — E119 Type 2 diabetes mellitus without complications: Secondary | ICD-10-CM | POA: Diagnosis not present

## 2019-03-02 DIAGNOSIS — R1907 Generalized intra-abdominal and pelvic swelling, mass and lump: Secondary | ICD-10-CM | POA: Diagnosis present

## 2019-03-02 DIAGNOSIS — R2243 Localized swelling, mass and lump, lower limb, bilateral: Secondary | ICD-10-CM | POA: Insufficient documentation

## 2019-03-02 DIAGNOSIS — K7469 Other cirrhosis of liver: Secondary | ICD-10-CM | POA: Diagnosis not present

## 2019-03-02 DIAGNOSIS — R600 Localized edema: Secondary | ICD-10-CM

## 2019-03-02 DIAGNOSIS — Z87891 Personal history of nicotine dependence: Secondary | ICD-10-CM | POA: Insufficient documentation

## 2019-03-02 DIAGNOSIS — Z79899 Other long term (current) drug therapy: Secondary | ICD-10-CM | POA: Insufficient documentation

## 2019-03-02 DIAGNOSIS — E7220 Disorder of urea cycle metabolism, unspecified: Secondary | ICD-10-CM | POA: Insufficient documentation

## 2019-03-02 DIAGNOSIS — Z794 Long term (current) use of insulin: Secondary | ICD-10-CM | POA: Insufficient documentation

## 2019-03-02 LAB — CBC WITH DIFFERENTIAL/PLATELET
Abs Immature Granulocytes: 0.1 10*3/uL — ABNORMAL HIGH (ref 0.00–0.07)
Basophils Absolute: 0 10*3/uL (ref 0.0–0.1)
Basophils Relative: 0 %
Eosinophils Absolute: 0 10*3/uL (ref 0.0–0.5)
Eosinophils Relative: 0 %
HCT: 32.6 % — ABNORMAL LOW (ref 36.0–46.0)
Hemoglobin: 11.8 g/dL — ABNORMAL LOW (ref 12.0–15.0)
Immature Granulocytes: 1 %
Lymphocytes Relative: 6 %
Lymphs Abs: 1.1 10*3/uL (ref 0.7–4.0)
MCH: 26 pg (ref 26.0–34.0)
MCHC: 36.2 g/dL — ABNORMAL HIGH (ref 30.0–36.0)
MCV: 72 fL — ABNORMAL LOW (ref 80.0–100.0)
Monocytes Absolute: 1.2 10*3/uL — ABNORMAL HIGH (ref 0.1–1.0)
Monocytes Relative: 7 %
Neutro Abs: 14.6 10*3/uL — ABNORMAL HIGH (ref 1.7–7.7)
Neutrophils Relative %: 86 %
Platelets: 144 10*3/uL — ABNORMAL LOW (ref 150–400)
RBC: 4.53 MIL/uL (ref 3.87–5.11)
RDW: 15 % (ref 11.5–15.5)
Smear Review: NORMAL
WBC: 17.1 10*3/uL — ABNORMAL HIGH (ref 4.0–10.5)
nRBC: 0.3 % — ABNORMAL HIGH (ref 0.0–0.2)

## 2019-03-02 LAB — COMPREHENSIVE METABOLIC PANEL
ALT: 86 U/L — ABNORMAL HIGH (ref 0–44)
AST: 94 U/L — ABNORMAL HIGH (ref 15–41)
Albumin: 2.5 g/dL — ABNORMAL LOW (ref 3.5–5.0)
Alkaline Phosphatase: 209 U/L — ABNORMAL HIGH (ref 38–126)
Anion gap: 11 (ref 5–15)
BUN: 55 mg/dL — ABNORMAL HIGH (ref 8–23)
CO2: 21 mmol/L — ABNORMAL LOW (ref 22–32)
Calcium: 8.9 mg/dL (ref 8.9–10.3)
Chloride: 97 mmol/L — ABNORMAL LOW (ref 98–111)
Creatinine, Ser: 1.34 mg/dL — ABNORMAL HIGH (ref 0.44–1.00)
GFR calc Af Amer: 45 mL/min — ABNORMAL LOW (ref >60)
GFR calc non Af Amer: 39 mL/min — ABNORMAL LOW (ref >60)
Glucose, Bld: 204 mg/dL — ABNORMAL HIGH (ref 70–99)
Potassium: 4 mmol/L (ref 3.5–5.1)
Sodium: 129 mmol/L — ABNORMAL LOW (ref 135–145)
Total Bilirubin: 2.2 mg/dL — ABNORMAL HIGH (ref 0.3–1.2)
Total Protein: 6.4 g/dL — ABNORMAL LOW (ref 6.5–8.1)

## 2019-03-02 LAB — PROTIME-INR
INR: 1.9 — ABNORMAL HIGH (ref 0.8–1.2)
Prothrombin Time: 21.1 seconds — ABNORMAL HIGH (ref 11.4–15.2)

## 2019-03-02 LAB — URINALYSIS, ROUTINE W REFLEX MICROSCOPIC
Bilirubin Urine: NEGATIVE
Glucose, UA: NEGATIVE mg/dL
Hgb urine dipstick: NEGATIVE
Ketones, ur: NEGATIVE mg/dL
Leukocytes,Ua: NEGATIVE
Nitrite: NEGATIVE
Protein, ur: NEGATIVE mg/dL
Specific Gravity, Urine: 1.02 (ref 1.005–1.030)
pH: 5 (ref 5.0–8.0)

## 2019-03-02 LAB — AMMONIA: Ammonia: 60 umol/L — ABNORMAL HIGH (ref 9–35)

## 2019-03-02 NOTE — ED Triage Notes (Signed)
abd pain and leg swelling since march 22 she states  Pt is sob, saw  A dr yesterday and  Increased your lasix and was given antibiotic , son wanted her seen today

## 2019-03-02 NOTE — Discharge Instructions (Signed)
Please read and follow all provided instructions.  Your diagnoses today include:  1. Bilateral leg edema   2. Hyperammonemia (HCC)     Tests performed today include:  Blood counts and electrolytes  Kidney function -slightly worse than normal  Ammonia your blood -slightly high  Chest x-ray -no sign of infection or other problems  Urine test-no sign of infection  Vital signs. See below for your results today.   Medications prescribed:   None  Take any prescribed medications only as directed.  Home care instructions:  Follow any educational materials contained in this packet.  Please call your gastroenterologist/liver doctor as soon as you can and let him know that your symptoms and emergency department visit today.  They will need to check on your lab work.  Please continue the lactulose medicine unless otherwise told to by your gastroenterologist.  This helps keep your ammonia levels low.   Follow-up instructions: Please follow-up with your primary care provider in the next 3 days for further evaluation of your symptoms.   Return instructions:   Please return to the Emergency Department if you experience worsening symptoms.   Return with worsening confusion, fever or other new symptoms, vomiting, severe abdominal pain  Please return if you have any other emergent concerns.  Additional Information:  Your vital signs today were: BP 109/88 (BP Location: Right Arm)    Pulse 79    Temp (!) 97.3 F (36.3 C) (Oral)    Resp 16    Ht 5' 3"  (1.6 m)    Wt 99.3 kg    SpO2 99%    BMI 38.79 kg/m  If your blood pressure (BP) was elevated above 135/85 this visit, please have this repeated by your doctor within one month. --------------

## 2019-03-02 NOTE — ED Provider Notes (Signed)
[facility] EMERGENCY DEPARTMENT Provider Note   CSN: [id number] Arrival date & time: 03/02/19  9242    History   Chief Complaint Chief Complaint  Patient presents with  . Abdominal Pain  . Leg Swelling    HPI Nekisha Mcdiarmid is a 73 y.o. female.     Patient with history of cirrhosis due to [facility], diabetes, previous stroke and subdural hematoma, hypertension, sarcoidosis --presents to the emergency department with worsening of her chronic abdominal and leg swelling.  In addition, patient states that she has been feeling more confused lately.  She has been having increasing swelling over the past several weeks.  She saw an urgent care yesterday who started her on doxycycline for a new ulceration on her right leg as well as increased dose of Lasix to 40 mg twice a day.  Patient last had a paracentesis in March where they removed 2 L of fluid.  Patient denies any current fevers or abdominal pain.  No chest pain or shortness of breath.  She is on lactulose liquid to keep her ammonia levels controlled.  Prior to that she was on expensive medication.  She has been having difficulty tolerating both of these medications.  The onset of this condition was acute. The course is constant. Aggravating factors: none. Alleviating factors: none.       Past Medical History:  Diagnosis Date  . Cirrhosis ([place])   . Diabetes mellitus without complication ([place])   . Diabetic retinopathy ([place])   . Diabetic retinopathy ([place])   . GERD (gastroesophageal reflux disease)   . High cholesterol   . Hypertension   . Sarcoidosis   . Stroke ([place])   . Subdural hematoma [facility])     Patient Active Problem List   Diagnosis Date Noted  . Ascites   . Hypomagnesemia   . Hypokalemia   . Liver cirrhosis secondary to NASH ([place])   . Hyperammonemia ([place]) 01/14/2019  . Subdural hematoma ([place]) 01/14/2019  . Type 2 diabetes mellitus with vascular disease ([place]) 01/14/2019  . Acute hepatic encephalopathy 01/14/2019   . Acute metabolic encephalopathy 68/34/1962  . Hyperlipidemia 07/22/2017  . Hypertension 07/22/2017  . Obstructive sleep apnea treated with continuous positive airway pressure (CPAP) 07/22/2017  . Acute CVA (cerebrovascular accident) ([place]) 12/15/2016  . TIA (transient ischemic attack) 12/14/2016  . GERD (gastroesophageal reflux disease) 12/14/2016  . Insulin dependent diabetes mellitus ([place]) 12/14/2016  . No blood products 12/14/2016    Past Surgical History:  Procedure Laterality Date  . CHOLECYSTECTOMY    . KNEE SURGERY       OB History   No obstetric history on file.      Home Medications    Prior to Admission medications   Medication Sig Start Date End Date Taking? Authorizing Provider  Calcium-Magnesium-Vitamin D (CALCIUM 1200+D3 PO) Take 2 tablets by mouth at bedtime.   Yes [provider]  furosemide (LASIX) 20 MG tablet Take 20 mg by mouth daily. 12/03/18  Yes [provider]  glipiZIDE (GLUCOTROL XL) 10 MG 24 hr tablet Take 10 mg by mouth 2 (two) times daily.   Yes [provider]  insulin lispro (HUMALOG KWIKPEN) 100 UNIT/ML KwikPen Inject 15 Units into the skin 3 (three) times daily with meals. MAX OF 50 UNITS/DAY    Yes [provider]  liraglutide (VICTOZA) 18 MG/3ML SOPN Inject 1.8 mg into the skin at bedtime.   Yes [provider]  Magnesium 500 MG CAPS Take 500 mg by mouth at  bedtime.   Yes [provider]  metFORMIN (GLUCOPHAGE) 500 MG tablet Take 500 mg by mouth 2 (two) times daily with a meal.   Yes [provider]  ezetimibe (ZETIA) 10 MG tablet Take 10 mg by mouth every morning. 11/06/16 01/14/19  [provider]  lactulose (CHRONULAC) 10 GM/15ML solution Take 15 mLs (10 g total) by mouth 2 (two) times daily. 01/16/19   Florencia Reasons, MD  propranolol (INDERAL) 10 MG tablet Take 1 tablet (10 mg total) by mouth 2 (two) times daily. 01/16/19   Florencia Reasons, MD  spironolactone (ALDACTONE) 50 MG tablet  Take 1 tablet (50 mg total) by mouth daily for 30 days. 01/16/19 02/15/19  Florencia Reasons, MD    Family History Family History  Problem Relation Age of Onset  . Stroke Mother   . Bladder Cancer Mother   . CAD Father   . Prostate cancer Father     Social History Social History   Tobacco Use  . Smoking status: Former Research scientist (life sciences)  . Smokeless tobacco: Never Used  Substance Use Topics  . Alcohol use: No  . Drug use: No     Allergies   Celecoxib; Rofecoxib; Ciprofloxacin; Darvon [propoxyphene]; Hydrocodone-acetaminophen; and Morphine   Review of Systems Review of Systems  Constitutional: Negative for chills and fever.  HENT: Negative for rhinorrhea and sore throat.   Eyes: Negative for redness.  Respiratory: Negative for cough and shortness of breath.   Cardiovascular: Positive for leg swelling. Negative for chest pain.  Gastrointestinal: Positive for abdominal distention. Negative for abdominal pain, diarrhea, nausea and vomiting.  Genitourinary: Negative for dysuria.  Musculoskeletal: Negative for myalgias.  Skin: Positive for color change and wound. Negative for rash.  Neurological: Negative for headaches.     Physical Exam Updated Vital Signs BP (!) 113/56 (BP Location: Right Arm)   Pulse 82   Temp (!) 97.3 F (36.3 C) (Oral)   Resp 18   Ht 5' 3"  (1.6 m)   Wt 99.3 kg   SpO2 98%   BMI 38.79 kg/m   Physical Exam Vitals signs and nursing note reviewed.  Constitutional:      Appearance: She is well-developed.  HENT:     Head: Normocephalic and atraumatic.  Eyes:     General:        Right eye: No discharge.        Left eye: No discharge.     Conjunctiva/sclera: Conjunctivae normal.  Neck:     Musculoskeletal: Normal range of motion and neck supple.  Cardiovascular:     Rate and Rhythm: Normal rate and regular rhythm.     Heart sounds: Normal heart sounds.  Pulmonary:     Effort: Pulmonary effort is normal.     Breath sounds: Normal breath sounds.  Abdominal:      General: There is distension.     Palpations: Abdomen is soft.     Tenderness: There is no abdominal tenderness.     Comments: Abd soft, distended, no tenderness on exam  Musculoskeletal:        General: No deformity.     Right lower leg: Edema present.     Left lower leg: Edema present.     Comments: 3+, bilateral, pitting edema both lower extremities  Skin:    General: Skin is warm and dry.     Comments: There is a 1 to 2 cm ulceration on the right calf extending to the subcutaneous tissue with mild erythema, no active drainage.  No extension of cellulitis.  Neurological:     Mental Status: She is alert.      ED Treatments / Results  Labs (all labs ordered are listed, but only abnormal results are displayed) Labs Reviewed  CBC WITH DIFFERENTIAL/PLATELET  COMPREHENSIVE METABOLIC PANEL  AMMONIA  PROTIME-INR  URINALYSIS, ROUTINE W REFLEX MICROSCOPIC    EKG None  Radiology No results found.  Procedures Procedures (including critical care time)  Medications Ordered in ED Medications - No data to display   Initial Impression / Assessment and Plan / ED Course  I have reviewed the triage vital signs and the nursing notes.  Pertinent labs & imaging results that were available during my care of the patient were reviewed by me and considered in my medical decision making (see chart for details).        Patient seen and examined.  Patient with history of liver cirrhosis with chronic anasarca, ascites, and lower extremity edema which seem to be worsening.  Also with worsening confusion.  Abdomen is soft and nontender on exam and patient is afebrile.  Low suspicion for spontaneous bacterial peritonitis today.  Ulceration on leg noted but does not appear to be overtly infected.  Vital signs reviewed and are as follows: BP (!) 113/56 (BP Location: Right Arm)   Pulse 82   Temp (!) 97.3 F (36.3 C) (Oral)   Resp 18   Ht 5' 3"  (1.6 m)   Wt 99.3 kg   SpO2 98%   BMI 38.79  kg/m   I attempted to contact the patient's son Shad x2, no answer.  11:01 AM CXR images personally reviewed and interpreted.   1:48 PM Patient discussed with and seen by Dr. Rex Kras earlier.   I reviewed all results with patient at bedside.  I also spoke with her son over the telephone.  His concern was that she has occasional "IBS symptoms" that manifests as diarrhea.  He admits that she was doing better today.  In addition, she has been more lethargic over the past week.  Discussed that patient does not have any strong indications for admission today however she will need very close follow-up by the doctors that know her especially given her abnormal lab results today.    We discussed signs and symptoms that should cause return including worsening confusion, vomiting, severe abdominal pain.  Patient and son verbalized understanding agrees with plan.  Discussed importance of continuing her ammonia medications and starting to take her Lasix at higher dose.  Discussed importance of follow-up to make sure that her electrolytes and kidney function continue to be okay.  Final Clinical Impressions(s) / ED Diagnoses   Final diagnoses:  Bilateral leg edema  Hyperammonemia ([place])   Patient with history of cirrhosis and multiple problems including lower extremity edema, anasarca, occasional episodes of metabolic encephalopathy due to high ammonia.  Patient is able to converse state today and use her phone.  She seems slightly sleepy but not overtly encephalopathic.  Her ammonia is slightly high at 60 but lower than when she was admitted in the past.  No signs of edema on her chest x-ray and no hypoxia.  Kidney function is slightly worse than baseline and sodium is slightly lower than baseline.  Normal potassium.  Her abdomen is soft and nontender and overall there is no concerning findings for SBP.  Her white count is slightly elevated but patient without signs of respiratory infection or urine  infection today.  Weighed risks and benefits of hospital  admission.  Feel inpatient admission at this point would be of limited utility.  Risk of inpatient admission in setting of current pandemic also considered.  Patient has appropriate follow-up and supportive family.  They seem reliable to return if symptoms decompensate further.  At this point would prefer to continue treatment with her medications for pneumonia and diuretics with close follow-up.  Patient and son agree with plan.  Skin lesion: minimal associated erythema. Continue doxycycline.    ED Discharge Orders    None       Carlisle Cater, PA-C 03/02/19 1354    Little, Wenda Overland, MD 03/02/19 [phone]

## 2019-03-02 NOTE — ED Notes (Signed)
Josh spoke to son. Pt aware that she is going home  Pt given gingerale

## 2019-03-14 ENCOUNTER — Emergency Department (HOSPITAL_COMMUNITY): Payer: Medicare Other | Admitting: Certified Registered"

## 2019-03-14 ENCOUNTER — Other Ambulatory Visit: Payer: Self-pay

## 2019-03-14 ENCOUNTER — Inpatient Hospital Stay (HOSPITAL_COMMUNITY)
Admission: EM | Admit: 2019-03-14 | Discharge: 2019-03-18 | DRG: 023 | Disposition: A | Discharge status: Rehabilitation Facility | Payer: Medicare Other | Attending: Neurology | Admitting: Neurology

## 2019-03-14 ENCOUNTER — Encounter (HOSPITAL_COMMUNITY): Payer: Self-pay | Admitting: Emergency Medicine

## 2019-03-14 ENCOUNTER — Emergency Department (HOSPITAL_COMMUNITY): Payer: Medicare Other

## 2019-03-14 ENCOUNTER — Encounter (HOSPITAL_COMMUNITY)
Admission: EM | Disposition: A | Discharge status: Rehabilitation Facility | Payer: Self-pay | Source: Home / Self Care | Attending: Neurology

## 2019-03-14 DIAGNOSIS — I63512 Cerebral infarction due to unspecified occlusion or stenosis of left middle cerebral artery: Principal | ICD-10-CM | POA: Diagnosis present

## 2019-03-14 DIAGNOSIS — Z823 Family history of stroke: Secondary | ICD-10-CM

## 2019-03-14 DIAGNOSIS — Z978 Presence of other specified devices: Secondary | ICD-10-CM

## 2019-03-14 DIAGNOSIS — J9600 Acute respiratory failure, unspecified whether with hypoxia or hypercapnia: Secondary | ICD-10-CM | POA: Diagnosis not present

## 2019-03-14 DIAGNOSIS — E1122 Type 2 diabetes mellitus with diabetic chronic kidney disease: Secondary | ICD-10-CM | POA: Diagnosis present

## 2019-03-14 DIAGNOSIS — N183 Chronic kidney disease, stage 3 unspecified: Secondary | ICD-10-CM

## 2019-03-14 DIAGNOSIS — I739 Peripheral vascular disease, unspecified: Secondary | ICD-10-CM | POA: Diagnosis present

## 2019-03-14 DIAGNOSIS — D509 Iron deficiency anemia, unspecified: Secondary | ICD-10-CM | POA: Diagnosis present

## 2019-03-14 DIAGNOSIS — R140 Abdominal distension (gaseous): Secondary | ICD-10-CM | POA: Diagnosis not present

## 2019-03-14 DIAGNOSIS — S065X9A Traumatic subdural hemorrhage with loss of consciousness of unspecified duration, initial encounter: Secondary | ICD-10-CM | POA: Diagnosis not present

## 2019-03-14 DIAGNOSIS — N184 Chronic kidney disease, stage 4 (severe): Secondary | ICD-10-CM | POA: Diagnosis not present

## 2019-03-14 DIAGNOSIS — I10 Essential (primary) hypertension: Secondary | ICD-10-CM | POA: Diagnosis present

## 2019-03-14 DIAGNOSIS — E11319 Type 2 diabetes mellitus with unspecified diabetic retinopathy without macular edema: Secondary | ICD-10-CM | POA: Diagnosis present

## 2019-03-14 DIAGNOSIS — Z6841 Body Mass Index (BMI) 40.0 and over, adult: Secondary | ICD-10-CM | POA: Diagnosis not present

## 2019-03-14 DIAGNOSIS — Z888 Allergy status to other drugs, medicaments and biological substances status: Secondary | ICD-10-CM

## 2019-03-14 DIAGNOSIS — R402232 Coma scale, best verbal response, inappropriate words, at arrival to emergency department: Secondary | ICD-10-CM | POA: Diagnosis present

## 2019-03-14 DIAGNOSIS — D860 Sarcoidosis of lung: Secondary | ICD-10-CM | POA: Diagnosis present

## 2019-03-14 DIAGNOSIS — K7460 Unspecified cirrhosis of liver: Secondary | ICD-10-CM | POA: Diagnosis present

## 2019-03-14 DIAGNOSIS — R188 Other ascites: Secondary | ICD-10-CM

## 2019-03-14 DIAGNOSIS — R8271 Bacteriuria: Secondary | ICD-10-CM | POA: Diagnosis present

## 2019-03-14 DIAGNOSIS — Z79899 Other long term (current) drug therapy: Secondary | ICD-10-CM

## 2019-03-14 DIAGNOSIS — R8281 Pyuria: Secondary | ICD-10-CM | POA: Diagnosis not present

## 2019-03-14 DIAGNOSIS — S065XAA Traumatic subdural hemorrhage with loss of consciousness status unknown, initial encounter: Secondary | ICD-10-CM

## 2019-03-14 DIAGNOSIS — B961 Klebsiella pneumoniae [K. pneumoniae] as the cause of diseases classified elsewhere: Secondary | ICD-10-CM | POA: Diagnosis present

## 2019-03-14 DIAGNOSIS — R4781 Slurred speech: Secondary | ICD-10-CM | POA: Diagnosis present

## 2019-03-14 DIAGNOSIS — N179 Acute kidney failure, unspecified: Secondary | ICD-10-CM | POA: Diagnosis not present

## 2019-03-14 DIAGNOSIS — E119 Type 2 diabetes mellitus without complications: Secondary | ICD-10-CM | POA: Diagnosis present

## 2019-03-14 DIAGNOSIS — J9601 Acute respiratory failure with hypoxia: Secondary | ICD-10-CM | POA: Diagnosis present

## 2019-03-14 DIAGNOSIS — Z720 Tobacco use: Secondary | ICD-10-CM | POA: Diagnosis not present

## 2019-03-14 DIAGNOSIS — I63232 Cerebral infarction due to unspecified occlusion or stenosis of left carotid arteries: Secondary | ICD-10-CM | POA: Diagnosis not present

## 2019-03-14 DIAGNOSIS — K7290 Hepatic failure, unspecified without coma: Secondary | ICD-10-CM | POA: Diagnosis not present

## 2019-03-14 DIAGNOSIS — I639 Cerebral infarction, unspecified: Secondary | ICD-10-CM | POA: Diagnosis present

## 2019-03-14 DIAGNOSIS — R609 Edema, unspecified: Secondary | ICD-10-CM | POA: Diagnosis not present

## 2019-03-14 DIAGNOSIS — E118 Type 2 diabetes mellitus with unspecified complications: Secondary | ICD-10-CM | POA: Diagnosis present

## 2019-03-14 DIAGNOSIS — G8191 Hemiplegia, unspecified affecting right dominant side: Secondary | ICD-10-CM | POA: Diagnosis present

## 2019-03-14 DIAGNOSIS — R29706 NIHSS score 6: Secondary | ICD-10-CM | POA: Diagnosis present

## 2019-03-14 DIAGNOSIS — E7800 Pure hypercholesterolemia, unspecified: Secondary | ICD-10-CM | POA: Diagnosis present

## 2019-03-14 DIAGNOSIS — Z794 Long term (current) use of insulin: Secondary | ICD-10-CM | POA: Diagnosis not present

## 2019-03-14 DIAGNOSIS — T82898A Other specified complication of vascular prosthetic devices, implants and grafts, initial encounter: Secondary | ICD-10-CM

## 2019-03-14 DIAGNOSIS — M7989 Other specified soft tissue disorders: Secondary | ICD-10-CM | POA: Diagnosis not present

## 2019-03-14 DIAGNOSIS — I69392 Facial weakness following cerebral infarction: Secondary | ICD-10-CM

## 2019-03-14 DIAGNOSIS — R402362 Coma scale, best motor response, obeys commands, at arrival to emergency department: Secondary | ICD-10-CM | POA: Diagnosis present

## 2019-03-14 DIAGNOSIS — K7581 Nonalcoholic steatohepatitis (NASH): Secondary | ICD-10-CM | POA: Diagnosis not present

## 2019-03-14 DIAGNOSIS — I6522 Occlusion and stenosis of left carotid artery: Secondary | ICD-10-CM | POA: Diagnosis not present

## 2019-03-14 DIAGNOSIS — Z8673 Personal history of transient ischemic attack (TIA), and cerebral infarction without residual deficits: Secondary | ICD-10-CM | POA: Diagnosis not present

## 2019-03-14 DIAGNOSIS — R402142 Coma scale, eyes open, spontaneous, at arrival to emergency department: Secondary | ICD-10-CM | POA: Diagnosis present

## 2019-03-14 DIAGNOSIS — J988 Other specified respiratory disorders: Secondary | ICD-10-CM | POA: Diagnosis not present

## 2019-03-14 DIAGNOSIS — Z885 Allergy status to narcotic agent status: Secondary | ICD-10-CM

## 2019-03-14 DIAGNOSIS — Z87891 Personal history of nicotine dependence: Secondary | ICD-10-CM

## 2019-03-14 DIAGNOSIS — K219 Gastro-esophageal reflux disease without esophagitis: Secondary | ICD-10-CM | POA: Diagnosis present

## 2019-03-14 DIAGNOSIS — E785 Hyperlipidemia, unspecified: Secondary | ICD-10-CM | POA: Diagnosis present

## 2019-03-14 DIAGNOSIS — D62 Acute posthemorrhagic anemia: Secondary | ICD-10-CM | POA: Diagnosis not present

## 2019-03-14 DIAGNOSIS — D696 Thrombocytopenia, unspecified: Secondary | ICD-10-CM | POA: Diagnosis present

## 2019-03-14 DIAGNOSIS — I959 Hypotension, unspecified: Secondary | ICD-10-CM | POA: Diagnosis not present

## 2019-03-14 DIAGNOSIS — B952 Enterococcus as the cause of diseases classified elsewhere: Secondary | ICD-10-CM | POA: Diagnosis present

## 2019-03-14 DIAGNOSIS — K767 Hepatorenal syndrome: Secondary | ICD-10-CM | POA: Diagnosis not present

## 2019-03-14 DIAGNOSIS — R001 Bradycardia, unspecified: Secondary | ICD-10-CM | POA: Diagnosis not present

## 2019-03-14 HISTORY — PX: RADIOLOGY WITH ANESTHESIA: SHX6223

## 2019-03-14 HISTORY — DX: Reserved for inherently not codable concepts without codable children: IMO0001

## 2019-03-14 HISTORY — DX: Other specified health status: Z78.9

## 2019-03-14 HISTORY — PX: IR PERCUTANEOUS ART THROMBECTOMY/INFUSION INTRACRANIAL INC DIAG ANGIO: IMG6087

## 2019-03-14 HISTORY — PX: IR CT HEAD LTD: IMG2386

## 2019-03-14 LAB — COMPREHENSIVE METABOLIC PANEL
ALT: 102 U/L — ABNORMAL HIGH (ref 0–44)
AST: 104 U/L — ABNORMAL HIGH (ref 15–41)
Albumin: 2.1 g/dL — ABNORMAL LOW (ref 3.5–5.0)
Alkaline Phosphatase: 225 U/L — ABNORMAL HIGH (ref 38–126)
Anion gap: 9 (ref 5–15)
BUN: 81 mg/dL — ABNORMAL HIGH (ref 8–23)
CO2: 26 mmol/L (ref 22–32)
Calcium: 8.3 mg/dL — ABNORMAL LOW (ref 8.9–10.3)
Chloride: 98 mmol/L (ref 98–111)
Creatinine, Ser: 1.78 mg/dL — ABNORMAL HIGH (ref 0.44–1.00)
GFR calc Af Amer: 32 mL/min — ABNORMAL LOW (ref >60)
GFR calc non Af Amer: 28 mL/min — ABNORMAL LOW (ref >60)
Glucose, Bld: 172 mg/dL — ABNORMAL HIGH (ref 70–99)
Potassium: 4.7 mmol/L (ref 3.5–5.1)
Sodium: 133 mmol/L — ABNORMAL LOW (ref 135–145)
Total Bilirubin: 2.6 mg/dL — ABNORMAL HIGH (ref 0.3–1.2)
Total Protein: 5.8 g/dL — ABNORMAL LOW (ref 6.5–8.1)

## 2019-03-14 LAB — URINALYSIS, ROUTINE W REFLEX MICROSCOPIC
Bilirubin Urine: NEGATIVE
Glucose, UA: NEGATIVE mg/dL
Ketones, ur: NEGATIVE mg/dL
Nitrite: NEGATIVE
Protein, ur: NEGATIVE mg/dL
Specific Gravity, Urine: 1.009 (ref 1.005–1.030)
pH: 6 (ref 5.0–8.0)

## 2019-03-14 LAB — RAPID URINE DRUG SCREEN, HOSP PERFORMED
Amphetamines: NOT DETECTED
Barbiturates: NOT DETECTED
Benzodiazepines: NOT DETECTED
Cocaine: NOT DETECTED
Opiates: NOT DETECTED
Tetrahydrocannabinol: NOT DETECTED

## 2019-03-14 LAB — CBC
HCT: 30 % — ABNORMAL LOW (ref 36.0–46.0)
Hemoglobin: 11 g/dL — ABNORMAL LOW (ref 12.0–15.0)
MCH: 25.5 pg — ABNORMAL LOW (ref 26.0–34.0)
MCHC: 36.7 g/dL — ABNORMAL HIGH (ref 30.0–36.0)
MCV: 69.6 fL — ABNORMAL LOW (ref 80.0–100.0)
Platelets: 97 10*3/uL — ABNORMAL LOW (ref 150–400)
RBC: 4.31 MIL/uL (ref 3.87–5.11)
RDW: 15.2 % (ref 11.5–15.5)
WBC: 8.9 10*3/uL (ref 4.0–10.5)
nRBC: 0.3 % — ABNORMAL HIGH (ref 0.0–0.2)

## 2019-03-14 LAB — DIFFERENTIAL
Abs Immature Granulocytes: 0.03 10*3/uL (ref 0.00–0.07)
Basophils Absolute: 0 10*3/uL (ref 0.0–0.1)
Basophils Relative: 0 %
Eosinophils Absolute: 0 10*3/uL (ref 0.0–0.5)
Eosinophils Relative: 0 %
Immature Granulocytes: 0 %
Lymphocytes Relative: 11 %
Lymphs Abs: 1 10*3/uL (ref 0.7–4.0)
Monocytes Absolute: 0.7 10*3/uL (ref 0.1–1.0)
Monocytes Relative: 8 %
Neutro Abs: 7.2 10*3/uL (ref 1.7–7.7)
Neutrophils Relative %: 81 %

## 2019-03-14 LAB — I-STAT CREATININE, ED: Creatinine, Ser: 1.7 mg/dL — ABNORMAL HIGH (ref 0.44–1.00)

## 2019-03-14 LAB — ETHANOL: Alcohol, Ethyl (B): <10 mg/dL (ref <10)

## 2019-03-14 LAB — PROTIME-INR
INR: 1.6 — ABNORMAL HIGH (ref 0.8–1.2)
Prothrombin Time: 19.2 seconds — ABNORMAL HIGH (ref 11.4–15.2)

## 2019-03-14 LAB — APTT: aPTT: 42 seconds — ABNORMAL HIGH (ref 24–36)

## 2019-03-14 LAB — AMMONIA: Ammonia: 55 umol/L — ABNORMAL HIGH (ref 9–35)

## 2019-03-14 LAB — GLUCOSE, CAPILLARY: Glucose-Capillary: 145 mg/dL — ABNORMAL HIGH (ref 70–99)

## 2019-03-14 SURGERY — RADIOLOGY WITH ANESTHESIA
Anesthesia: General

## 2019-03-14 MED ORDER — INSULIN ASPART 100 UNIT/ML ~~LOC~~ SOLN
0.0000 [IU] | SUBCUTANEOUS | Status: DC
  Administered 2019-03-15: 01:00:00 2 [IU] via SUBCUTANEOUS
  Administered 2019-03-16: 17:00:00 5 [IU] via SUBCUTANEOUS
  Administered 2019-03-16 – 2019-03-17 (×2): 8 [IU] via SUBCUTANEOUS
  Administered 2019-03-17: 04:00:00 5 [IU] via SUBCUTANEOUS
  Administered 2019-03-17: 2 [IU] via SUBCUTANEOUS

## 2019-03-14 MED ORDER — ASPIRIN 81 MG PO CHEW
CHEWABLE_TABLET | ORAL | Status: AC
  Filled 2019-03-14: qty 1

## 2019-03-14 MED ORDER — ACETAMINOPHEN 325 MG PO TABS: 650.0000 mg | ORAL_TABLET | ORAL | Status: DC | PRN

## 2019-03-14 MED ORDER — PROPRANOLOL HCL 10 MG PO TABS
10.0000 mg | ORAL_TABLET | Freq: Two times a day (BID) | ORAL | Status: DC
  Filled 2019-03-14 (×2): qty 1

## 2019-03-14 MED ORDER — IOHEXOL 300 MG/ML  SOLN
75.0000 mL | Freq: Once | INTRAMUSCULAR | Status: AC | PRN
  Administered 2019-03-14: 75 mL via INTRA_ARTERIAL

## 2019-03-14 MED ORDER — NITROGLYCERIN 1 MG/10 ML FOR IR/CATH LAB
INTRA_ARTERIAL | Status: AC
  Filled 2019-03-14: qty 10

## 2019-03-14 MED ORDER — ROCURONIUM BROMIDE 50 MG/5ML IV SOLN
INTRAVENOUS | Status: AC | PRN
  Administered 2019-03-14: 100 mg via INTRAVENOUS

## 2019-03-14 MED ORDER — MAGNESIUM OXIDE 400 (241.3 MG) MG PO TABS
400.0000 mg | ORAL_TABLET | Freq: Every day | ORAL | Status: DC
  Administered 2019-03-15 – 2019-03-17 (×3): 400 mg via ORAL
  Filled 2019-03-14 (×3): qty 1

## 2019-03-14 MED ORDER — HEPARIN SODIUM (PORCINE) 1000 UNIT/ML IJ SOLN
INTRAMUSCULAR | Status: DC | PRN
  Administered 2019-03-14: 1000 [IU] via INTRAVENOUS

## 2019-03-14 MED ORDER — TIROFIBAN HCL IN NACL 5-0.9 MG/100ML-% IV SOLN
INTRAVENOUS | Status: AC
  Filled 2019-03-14: qty 100

## 2019-03-14 MED ORDER — STROKE: EARLY STAGES OF RECOVERY BOOK
Freq: Once | Status: AC
  Administered 2019-03-15: 04:00:00

## 2019-03-14 MED ORDER — LACTATED RINGERS IV SOLN
INTRAVENOUS | Status: DC | PRN
  Administered 2019-03-14: 21:00:00 via INTRAVENOUS

## 2019-03-14 MED ORDER — VANCOMYCIN HCL IN DEXTROSE 1-5 GM/200ML-% IV SOLN
INTRAVENOUS | Status: AC
  Filled 2019-03-14: qty 200

## 2019-03-14 MED ORDER — SPIRONOLACTONE 50 MG PO TABS
50.0000 mg | ORAL_TABLET | Freq: Every day | ORAL | Status: DC
  Filled 2019-03-14: qty 1

## 2019-03-14 MED ORDER — PROPOFOL 1000 MG/100ML IV EMUL
INTRAVENOUS | Status: AC
  Administered 2019-03-14: 21:00:00 5 ug/kg/min via INTRAVENOUS
  Filled 2019-03-14: qty 100

## 2019-03-14 MED ORDER — FUROSEMIDE 20 MG PO TABS: 20.0000 mg | ORAL_TABLET | Freq: Every day | ORAL | Status: DC

## 2019-03-14 MED ORDER — LIDOCAINE HCL 1 % IJ SOLN
INTRAMUSCULAR | Status: AC
  Filled 2019-03-14: qty 20

## 2019-03-14 MED ORDER — NOREPINEPHRINE 4 MG/250ML-% IV SOLN
INTRAVENOUS | Status: AC
  Filled 2019-03-14: qty 250

## 2019-03-14 MED ORDER — ORAL CARE MOUTH RINSE
15.0000 mL | OROMUCOSAL | Status: DC
  Administered 2019-03-15 – 2019-03-16 (×14): 15 mL via OROMUCOSAL

## 2019-03-14 MED ORDER — ACETAMINOPHEN 650 MG RE SUPP: 650.0000 mg | RECTAL | Status: DC | PRN

## 2019-03-14 MED ORDER — SENNOSIDES-DOCUSATE SODIUM 8.6-50 MG PO TABS: 1.0000 | ORAL_TABLET | Freq: Every evening | ORAL | Status: DC | PRN

## 2019-03-14 MED ORDER — CLEVIDIPINE BUTYRATE 0.5 MG/ML IV EMUL: 0.0000 mg/h | INTRAVENOUS | Status: DC

## 2019-03-14 MED ORDER — VANCOMYCIN HCL 1000 MG IV SOLR
INTRAVENOUS | Status: DC | PRN
  Administered 2019-03-14: 22:00:00 1000 mg via INTRAVENOUS

## 2019-03-14 MED ORDER — EPHEDRINE SULFATE 50 MG/ML IJ SOLN
INTRAMUSCULAR | Status: DC | PRN
  Administered 2019-03-14 (×2): 15 mg via INTRAVENOUS

## 2019-03-14 MED ORDER — ACETAMINOPHEN 160 MG/5ML PO SOLN: 650.0000 mg | ORAL | Status: DC | PRN

## 2019-03-14 MED ORDER — SODIUM CHLORIDE 0.9 % IV SOLN
INTRAVENOUS | Status: DC | PRN
  Administered 2019-03-14: 22:00:00 20 ug/min via INTRAVENOUS

## 2019-03-14 MED ORDER — CLOPIDOGREL BISULFATE 300 MG PO TABS
ORAL_TABLET | ORAL | Status: AC
  Filled 2019-03-14: qty 1

## 2019-03-14 MED ORDER — ASPIRIN 325 MG PO TABS
ORAL_TABLET | ORAL | Status: AC
  Filled 2019-03-14: qty 1

## 2019-03-14 MED ORDER — PHENYLEPHRINE HCL-NACL 10-0.9 MG/250ML-% IV SOLN
INTRAVENOUS | Status: AC
  Administered 2019-03-14: 5 ug/min via INTRAVENOUS
  Filled 2019-03-14: qty 250

## 2019-03-14 MED ORDER — PROPOFOL 1000 MG/100ML IV EMUL
5.0000 ug/kg/min | INTRAVENOUS | Status: DC
  Administered 2019-03-14: 5 ug/kg/min via INTRAVENOUS

## 2019-03-14 MED ORDER — SODIUM CHLORIDE 0.9 % IV SOLN
INTRAVENOUS | Status: DC
  Administered 2019-03-14: via INTRAVENOUS

## 2019-03-14 MED ORDER — CHLORHEXIDINE GLUCONATE 0.12% ORAL RINSE (MEDLINE KIT)
15.0000 mL | Freq: Two times a day (BID) | OROMUCOSAL | Status: DC
  Administered 2019-03-15 – 2019-03-17 (×6): 15 mL via OROMUCOSAL

## 2019-03-14 MED ORDER — EPTIFIBATIDE 20 MG/10ML IV SOLN
INTRAVENOUS | Status: AC
  Filled 2019-03-14: qty 10

## 2019-03-14 MED ORDER — IOHEXOL 350 MG/ML SOLN
75.0000 mL | Freq: Once | INTRAVENOUS | Status: AC | PRN
  Administered 2019-03-14: 20:00:00 75 mL via INTRAVENOUS

## 2019-03-14 MED ORDER — TICAGRELOR 90 MG PO TABS
ORAL_TABLET | ORAL | Status: AC
  Administered 2019-03-14: 22:00:00 180 mg
  Filled 2019-03-14: qty 2

## 2019-03-14 MED ORDER — SODIUM CHLORIDE 0.9 % IV SOLN: INTRAVENOUS | Status: DC

## 2019-03-14 MED ORDER — LACTULOSE 10 GM/15ML PO SOLN
10.0000 g | Freq: Two times a day (BID) | ORAL | Status: DC
  Administered 2019-03-15 – 2019-03-18 (×6): 10 g via ORAL
  Filled 2019-03-14 (×2): qty 15
  Filled 2019-03-14: qty 30
  Filled 2019-03-14: qty 15
  Filled 2019-03-14: qty 30
  Filled 2019-03-14: qty 15

## 2019-03-14 MED ORDER — ROCURONIUM BROMIDE 100 MG/10ML IV SOLN
INTRAVENOUS | Status: DC | PRN
  Administered 2019-03-14: 30 mg via INTRAVENOUS

## 2019-03-14 MED ORDER — ETOMIDATE 2 MG/ML IV SOLN
INTRAVENOUS | Status: AC | PRN
  Administered 2019-03-14: 20 mg via INTRAVENOUS

## 2019-03-14 MED ORDER — PHENYLEPHRINE HCL-NACL 10-0.9 MG/250ML-% IV SOLN
0.0000 ug/min | INTRAVENOUS | Status: DC
  Administered 2019-03-14: 5 ug/min via INTRAVENOUS
  Administered 2019-03-15: 125 ug/min via INTRAVENOUS
  Administered 2019-03-15: 150 ug/min via INTRAVENOUS
  Administered 2019-03-15: 08:00:00 200 ug/min via INTRAVENOUS
  Filled 2019-03-14 (×2): qty 250
  Filled 2019-03-14: qty 500

## 2019-03-14 NOTE — Procedures (Signed)
S/P lt common carotid arteriogram followed by complete revascularization of symptomatic severe stenosis of Lt ICA prox sec to a large nearly occlusive flow limiting thrombus with mechanical thrombectomy with x 1 pass with a 35m x 40 mm solitaire retreiver with prox flow arrest and penumbra aspiration . LT MCA TICI 3 revascularization .

## 2019-03-14 NOTE — Anesthesia Preprocedure Evaluation (Signed)
Anesthesia Evaluation  Patient identified by MRN, date of birth, ID band Patient unresponsive    Reviewed: Allergy & Precautions, Patient's Chart, lab work & pertinent test results, Unable to perform ROS - Chart review onlyPreop documentation limited or incomplete due to emergent nature of procedure.  History of Anesthesia Complications Negative for: history of anesthetic complications  Airway Mallampati: Intubated       Dental   Pulmonary sleep apnea , former smoker,    breath sounds clear to auscultation       Cardiovascular hypertension, + Peripheral Vascular Disease   Rhythm:Regular     Neuro/Psych CVA, Residual Symptoms    GI/Hepatic GERD  ,(+) Hepatitis -  Endo/Other  diabetesMorbid obesity  Renal/GU Renal InsufficiencyRenal disease     Musculoskeletal   Abdominal   Peds  Hematology   Anesthesia Other Findings   Reproductive/Obstetrics                             Anesthesia Physical Anesthesia Plan  ASA: IV and emergent  Anesthesia Plan: General   Post-op Pain Management:    Induction: Inhalational  PONV Risk Score and Plan: 3 and Treatment may vary due to age or medical condition  Airway Management Planned: Oral ETT  Additional Equipment:   Intra-op Plan:   Post-operative Plan: Post-operative intubation/ventilation  Informed Consent:     Only emergency history available and History available from chart only  Plan Discussed with: CRNA and Surgeon  Anesthesia Plan Comments:         Anesthesia Quick Evaluation

## 2019-03-14 NOTE — Anesthesia Procedure Notes (Signed)
Arterial Line Insertion Start/End4/19/2020 10:35 PM, 03/14/2019 10:45 PM Performed by: Valetta Fuller, CRNA, CRNA  Left, radial was placed Catheter size: 20 G Maximum sterile barriers used   Attempts: 1 Procedure performed without using ultrasound guided technique. Following insertion, dressing applied and Biopatch. Post procedure assessment: normal

## 2019-03-14 NOTE — Progress Notes (Signed)
Patient ID: Monica Pearson, female   DOB: Jul 08, 1946, 73 y.o.   MRN: [id number] INR.Post procedure CT no ICH or mass effect noted. RT groin sheath replaced with an 32F angioseal device for hemostasis. Distal pulses all dopplerable and unchanged. S.Harlon Kutner MD

## 2019-03-14 NOTE — Transfer of Care (Signed)
Immediate Anesthesia Transfer of Care Note  Patient: Larri Brewton  Procedure(s) Performed: RADIOLOGY WITH ANESTHESIA (N/A )  Patient Location: NICU  Anesthesia Type:General  Level of Consciousness: Patient remains intubated per anesthesia plan  Airway & Oxygen Therapy: Patient placed on Ventilator (see vital sign flow sheet for setting)  Post-op Assessment: Report given to RN and Post -op Vital signs reviewed and stable  Post vital signs: Reviewed and stable  Last Vitals:  Vitals Value Taken Time  BP 103/66 03/14/2019 11:15 PM  Temp    Pulse 66 03/14/2019 11:15 PM  Resp 22 03/14/2019 11:15 PM  SpO2 100 % 03/14/2019 11:15 PM    Last Pain:  Vitals:   03/14/19 2014  PainSc: 0-No pain         Complications: No apparent anesthesia complications

## 2019-03-14 NOTE — H&P (Signed)
Referring Physician: Dr. Sabra Heck    Chief Complaint: RUE weakness and slurred speech  HPI: Monica Pearson is an 73 y.o. female with a history of liver cirrhosis and acute on chronic subdural hematoma January 20 of this year, who presents with stuttering TIA symptoms. Family noted at home this evening that the patient was having sudden onset of slurred speech and right sided weakness with left sided gaze at 6:40 PM. On their arrival, EMS noted deficits as well, but they resolved en route. Vitals per EMS: BP 166/72, HR 76, O2 Sat 98%, CBG 205. On arrival to the ED, she experienced recurrent symptoms of dysarthria and right sided weakness with time of onset 7:17 PM.   She has a history of cirrhosis in the context of a diagnosis of NASH, with severe ascites and peripheral edema, worse to her legs. The lower extremity edema is of such severity that it impairs her ability to move her legs, but she is still able to ambulate at home. She states that she is independent with all of her ADLs.   Stroke risk factors include DM, hypercholesterolemia, HTN and prior stroke.   LSN: 7:17 PM tPA Given: No: Recent subdural hematoma  Past Medical History:  Diagnosis Date  . Cirrhosis ([place])   . Diabetes mellitus without complication ([place])   . Diabetic retinopathy ([place])   . Diabetic retinopathy ([place])   . GERD (gastroesophageal reflux disease)   . High cholesterol   . Hypertension   . Sarcoidosis   . Stroke ([place])   . Subdural hematoma [facility])     Past Surgical History:  Procedure Laterality Date  . CHOLECYSTECTOMY    . KNEE SURGERY      Family History  Problem Relation Age of Onset  . Stroke Mother   . Bladder Cancer Mother   . CAD Father   . Prostate cancer Father    Social History:  reports that she has quit smoking. She has never used smokeless tobacco. She reports that she does not drink alcohol or use drugs.  Allergies:  Allergies  Allergen Reactions  . Celecoxib Swelling    Ankles swell  .  Rofecoxib Swelling    Ankles swell  . Ciprofloxacin Itching  . Darvon [Propoxyphene] Nausea And Vomiting  . Hydrocodone-Acetaminophen Nausea And Vomiting  . Morphine Nausea And Vomiting    Home Medications:  Calcium-Mg-VitD Zetia Lasix Glipizide Humalog Lactulose Victoza Magnesium Metformin Inderal Aldactone  ROS: As per HPI.   Physical Examination: Weight 106.2 kg.  HEENT: [place]/AT  Lungs: Respirations unlabored Ext: Warm and well perfused   Neurologic Examination: Mental Status: Awake and alert. Able to answer orientation questions and follow all commands. Speech dysarthric but fluent. Able to name objects.  Cranial Nerves: II:  Visual fields intact. PERRL.   III,IV, VI: Horizontal gaze is conjugate with hesitancy to the right, normal to left. Saccadic quality of pursuits is noted. No nystagmus. V,VII: Mild right facial droop with some involvement of upper quadrant in addition to lower quadrant. Temp sensation equal bilaterally  VIII: hearing intact to voice IX,X: Palate elevates normally  XI: Lag on the right with shoulder shrug XII: midline tongue extension  Motor: RUE 4/5 RLE 2/5 in the context of severe edema LUE 5/5 LLE 3/5 in the context of severe edema Sensory: Temp and light touch intact in upper and lower extremities bilaterally. Positive for extinction on the right.  Deep Tendon Reflexes:  2+ bilateral upper extremities. Hypoactive lower extremities.  Plantars: Equivocal bilaterally  Cerebellar:  No ataxia with FNF bilaterally. Slow on the right. Gait: Unable to assess  NIHSS: 6  Results for orders placed or performed during the hospital encounter of 03/14/19 (from the past 48 hour(s))  CBC     Status: Abnormal (Preliminary result)   Collection Time: 03/14/19  7:40 PM  Result Value Ref Range   WBC 8.9 4.0 - 10.5 K/uL   RBC 4.31 3.87 - 5.11 MIL/uL   Hemoglobin 11.0 (L) 12.0 - 15.0 g/dL   HCT 30.0 (L) 36.0 - 46.0 %   MCV 69.6 (L) 80.0 - 100.0 fL    MCH 25.5 (L) 26.0 - 34.0 pg   MCHC 36.7 (H) 30.0 - 36.0 g/dL   RDW 15.2 11.5 - 15.5 %   Platelets PENDING 150 - 400 K/uL   nRBC 0.3 (H) 0.0 - 0.2 %    Comment: Performed at [facility], [address], [place], [place] [address]  I-stat Creatinine, ED     Status: Abnormal   Collection Time: 03/14/19  7:41 PM  Result Value Ref Range   Creatinine, Ser 1.70 (H) 0.44 - 1.00 mg/dL   Ct Head Code Stroke Wo Contrast  Result Date: 03/14/2019 CLINICAL DATA:  Code stroke. Slurred speech and right-sided deficits EXAM: CT HEAD WITHOUT CONTRAST TECHNIQUE: Contiguous axial images were obtained from the base of the skull through the vertex without intravenous contrast. COMPARISON:  Head CT 02/11/2019 FINDINGS: Brain: Minimal residual right convexity subdural blood, unchanged compared to 02/11/2019. No acute hemorrhage. The size and configuration of the ventricles and extra-axial CSF spaces are normal. There is hypoattenuation of the periventricular white matter, most commonly indicating chronic ischemic microangiopathy. Vascular: No abnormal hyperdensity of the major intracranial arteries or dural venous sinuses. No intracranial atherosclerosis. Skull: The visualized skull base, calvarium and extracranial soft tissues are normal. Sinuses/Orbits: No fluid levels or advanced mucosal thickening of the visualized paranasal sinuses. No mastoid or middle ear effusion. The orbits are normal. ASPECTS [facility] Stroke Program Early CT Score) - Ganglionic level infarction (caudate, lentiform nuclei, internal capsule, insula, M1-M3 cortex): 7 - Supraganglionic infarction (M4-M6 cortex): 3 Total score (0-10 with 10 being normal): 10 IMPRESSION: 1. Unchanged appearance of minimal residual right convexity subdural blood without acute hemorrhage. 2. ASPECTS is 10. 3. These results were communicated to Dr. Kerney Elbe at 7:50 pm on 03/14/2019 by text page via the [facility] messaging system. Electronically Signed   By: Ulyses Jarred  M.D.   On: 03/14/2019 19:51    Assessment: 73 y.o. female presenting with acute onset of dysarthria, right facial droop, right hemiparesis and impairment of gaze to the right.  1. NIHSS of 6.  2. Overall presentation most consistent with acute ischemic stroke. Exam findings localize to the left frontal lobe. 3. CT head with no acute hypodensity. There is minimal right convexity subdural blood without acute hemorrhage. 4. 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 This causes severe qualitative narrowing of the proximal ICA. This may be a source for small downstream emboli. No intracranial occlusion or stenosis. 5. Not a tPA candidate due to recent subdural hemorrhage 6. The patient is an endovascular candidate. Risks/benefits discussed with patient, who provided preliminary consent for angiography with possible clot retraction. Final informed consent to be obtained by VIR team.  7. Cirrhosis secondary to NASH. Prominent ascites and dependent edema seen on exam. 8. Stroke Risk Factors - DM, hypercholesterolemia, HTN and  prior stroke.    Plan: 1. The patient is being transported emergently to [place].  2. Following VIR, will admit to the ICU under the Neurology service.  3. Post-procedure order set to include frequent neuro checks and BP management.  4. MRI of the brain without contrast 5. PT consult, OT consult, Speech consult 6. Echocardiogram 7. No antiplatelet agents or anticoagulants for at least 24 hours following VIR. Can consider starting if repeat CT at 24 hours is negative for hemorrhagic conversion.  8. DVT prophylaxis with SCDs 9. Cardiac telemetry 10. Avoid hepatotoxic medications.  11. Hospitalist consult for monitoring of cirrhosis and ascites.  65 minutes spent in the emergent neurological evaluation and management of this critically ill acute stroke patient  @Electronically  signed:  Dr. Kerney Elbe  03/14/2019, 7:57 PM

## 2019-03-14 NOTE — ED Triage Notes (Addendum)
BIB [facility] from home with c/o of slurred speech, right sided weakness, and left sided gaze. Symptoms resolved in route. Per EMS pt began to have right sided weakness and slurred speech with right sided facial droop upon arrival to ED. [place]: 19:17. Code Stroke called on pt arrival to bridge.

## 2019-03-14 NOTE — ED Provider Notes (Signed)
[facility] EMERGENCY DEPARTMENT Provider Note   CSN: [id number] Arrival date & time: 03/14/19  1930    History   Chief Complaint No chief complaint on file.   HPI Shariah Assad is a 73 y.o. female.     HPI  The patient is a 73 year old female, she is a known diabetic with also is treated for hypertension sarcoidosis and has had a history of a stroke and a subdural hematoma.  She presents to the hospital today with a complaint of right upper extremity weakness that she noticed when she started dropping things.  According to the patient this is been coming and going this evening, when the paramedics got to her she did have some slight right weakness which got better than it came back, last time she was seen well was 7:15 PM or thereabouts.  On arrival the patient is having some difficulty using her right upper extremity but is able to speak.  The paramedics did notice some slight slurred speech in route.  Level 5 caveat applies secondary to acuity of condition.  The patient is not on any anticoagulants.  She is known to have cirrhosis  Past Medical History:  Diagnosis Date  . Cirrhosis ([place])   . Diabetes mellitus without complication ([place])   . Diabetic retinopathy ([place])   . Diabetic retinopathy ([place])   . GERD (gastroesophageal reflux disease)   . High cholesterol   . Hypertension   . Sarcoidosis   . Stroke ([place])   . Subdural hematoma [facility])     Patient Active Problem List   Diagnosis Date Noted  . Ascites   . Hypomagnesemia   . Hypokalemia   . Liver cirrhosis secondary to NASH ([place])   . Hyperammonemia ([place]) 01/14/2019  . Subdural hematoma ([place]) 01/14/2019  . Type 2 diabetes mellitus with vascular disease ([place]) 01/14/2019  . Acute hepatic encephalopathy 01/14/2019  . Acute metabolic encephalopathy 65/46/5035  . Hyperlipidemia 07/22/2017  . Hypertension 07/22/2017  . Obstructive sleep apnea treated with continuous positive airway pressure (CPAP) 07/22/2017   . Acute CVA (cerebrovascular accident) ([place]) 12/15/2016  . TIA (transient ischemic attack) 12/14/2016  . GERD (gastroesophageal reflux disease) 12/14/2016  . Insulin dependent diabetes mellitus ([place]) 12/14/2016  . No blood products 12/14/2016    Past Surgical History:  Procedure Laterality Date  . CHOLECYSTECTOMY    . KNEE SURGERY       OB History   No obstetric history on file.      Home Medications    Prior to Admission medications   Medication Sig Start Date End Date Taking? Authorizing Provider  Calcium-Magnesium-Vitamin D (CALCIUM 1200+D3 PO) Take 2 tablets by mouth at bedtime.    [provider]  ezetimibe (ZETIA) 10 MG tablet Take 10 mg by mouth every morning. 11/06/16 01/14/19  [provider]  furosemide (LASIX) 20 MG tablet Take 20 mg by mouth daily. 12/03/18   [provider]  glipiZIDE (GLUCOTROL XL) 10 MG 24 hr tablet Take 10 mg by mouth 2 (two) times daily.    [provider]  insulin lispro (HUMALOG KWIKPEN) 100 UNIT/ML KwikPen Inject 15 Units into the skin 3 (three) times daily with meals. MAX OF 50 UNITS/DAY     [provider]  lactulose (CHRONULAC) 10 GM/15ML solution Take 15 mLs (10 g total) by mouth 2 (two) times daily. 01/16/19   Florencia Reasons, MD  liraglutide (VICTOZA) 18 MG/3ML SOPN Inject 1.8 mg into the skin at bedtime.    [provider]  Magnesium 500 MG CAPS Take 500 mg by mouth at bedtime.    [provider]  metFORMIN (GLUCOPHAGE) 500 MG tablet Take 500 mg by mouth 2 (two) times daily with a meal.    [provider]  propranolol (INDERAL) 10 MG tablet Take 1 tablet (10 mg total) by mouth 2 (two) times daily. 01/16/19   Florencia Reasons, MD  spironolactone (ALDACTONE) 50 MG tablet Take 1 tablet (50 mg total) by mouth daily for 30 days. 01/16/19 02/15/19  Florencia Reasons, MD    Family History Family History  Problem Relation Age of Onset  . Stroke Mother   . Bladder Cancer Mother   . CAD Father   .  Prostate cancer Father     Social History Social History   Tobacco Use  . Smoking status: Former Research scientist (life sciences)  . Smokeless tobacco: Never Used  Substance Use Topics  . Alcohol use: No  . Drug use: No     Allergies   Celecoxib; Rofecoxib; Ciprofloxacin; Darvon [propoxyphene]; Hydrocodone-acetaminophen; and Morphine   Review of Systems Review of Systems  Unable to perform ROS: Acuity of condition     Physical Exam Updated Vital Signs Wt 106.2 kg   BMI 41.47 kg/m   Physical Exam Vitals signs and nursing note reviewed.  Constitutional:      General: She is not in acute distress.    Appearance: She is well-developed.  HENT:     Head: Normocephalic and atraumatic.     Mouth/Throat:     Pharynx: No oropharyngeal exudate.  Eyes:     General: No scleral icterus.       Right eye: No discharge.        Left eye: No discharge.     Conjunctiva/sclera: Conjunctivae normal.     Pupils: Pupils are equal, round, and reactive to light.  Neck:     Musculoskeletal: Normal range of motion and neck supple.     Thyroid: No thyromegaly.     Vascular: No JVD.  Cardiovascular:     Rate and Rhythm: Normal rate and regular rhythm.     Heart sounds: Normal heart sounds. No murmur. No friction rub. No gallop.   Pulmonary:     Effort: Pulmonary effort is normal. No respiratory distress.     Breath sounds: Normal breath sounds. No wheezing or rales.  Abdominal:     General: Bowel sounds are normal. There is no distension.     Palpations: Abdomen is soft. There is mass.     Tenderness: There is no abdominal tenderness.     Comments: The patient has diffuse anasarca and abdominal distention with a dull percussed abdomen  Musculoskeletal: Normal range of motion.        General: No tenderness.     Right lower leg: Edema present.     Left lower leg: Edema present.     Comments: Significant anasarca and edema of the legs  Lymphadenopathy:     Cervical: No cervical adenopathy.  Skin:     General: Skin is warm and dry.     Findings: No erythema or rash.  Neurological:     Mental Status: She is alert.     Coordination: Coordination normal.     Comments: The patient is able to move all 4 extremities but has weakness in the right hand and some difficulty with dysmetria on the right upper extremity.  Speech is clear, gaze is normal, no lateralizing symptoms with her sensation motor vision etc.  Psychiatric:        Behavior: Behavior normal.      ED Treatments / Results  Labs (all labs ordered are listed, but only abnormal results are displayed) Labs Reviewed  I-STAT CREATININE, ED - Abnormal; Notable for the following components:      Result Value   Creatinine, Ser 1.70 (*)    All other components within normal limits  ETHANOL  PROTIME-INR  APTT  CBC  DIFFERENTIAL  COMPREHENSIVE METABOLIC PANEL  RAPID URINE DRUG SCREEN, HOSP PERFORMED  URINALYSIS, ROUTINE W REFLEX MICROSCOPIC  AMMONIA    EKG None  Radiology No results found.  Procedures .Critical Care Performed by: Noemi Chapel, MD Authorized by: Noemi Chapel, MD   Critical care provider statement:    Critical care time (minutes):  35   Critical care time was exclusive of:  Separately billable procedures and treating other patients and teaching time   Critical care was necessary to treat or prevent imminent or life-threatening deterioration of the following conditions:  CNS failure or compromise   Critical care was time spent personally by me on the following activities:  Blood draw for specimens, development of treatment plan with patient or surrogate, discussions with consultants, evaluation of patient's response to treatment, examination of patient, obtaining history from patient or surrogate, ordering and performing treatments and interventions, ordering and review of laboratory studies, ordering and review of radiographic studies, pulse oximetry, re-evaluation of patient's condition and review of old  charts Comments:        Procedure Name: Intubation Date/Time: 03/14/2019 8:34 PM Performed by: Noemi Chapel, MD Pre-anesthesia Checklist: Patient identified, Patient being monitored, Emergency Drugs available, Timeout performed and Suction available Oxygen Delivery Method: Non-rebreather mask Preoxygenation: Pre-oxygenation with 100% oxygen Induction Type: Rapid sequence Ventilation: Mask ventilation without difficulty Laryngoscope Size: Mac and 4 Grade View: Grade II Tube size: 7.5 mm Number of attempts: 1 Airway Equipment and Method: Stylet (direct laryngoscopy) Placement Confirmation: ETT inserted through vocal cords under direct vision,  CO2 detector and Breath sounds checked- equal and bilateral Tube secured with: ETT holder Dental Injury: Teeth and Oropharynx as per pre-operative assessment  Difficulty Due To: Difficulty was unanticipated Comments:         (including critical care time)  Medications Ordered in ED Medications - No data to display   Initial Impression / Assessment and Plan / ED Course  I have reviewed the triage vital signs and the nursing notes.  Pertinent labs & imaging results that were available during my care of the patient were reviewed by me and considered in my medical decision making (see chart for details).  Clinical Course as of Mar 13 2033  Nancy Fetter Mar 14, 2019  2026 Labs are significantly abnormal, severe liver dysfunction with very low total albumin at 2.1 which explains why the patient is retaining so much fluid.  Creatinine is 1.78, ammonia is only 55, CBC shows no significant anemia, platelets are thrombocytopenic, INR is only 1.6.   [BM]  2032 Neurology wants to take the patient to the IR suite to remove clot - they have requested intubation due to the current protocol for COVID19 - I have had a disucssion with the patient surrounding the need for intubation and she has agreed.  On repeat exam, she has a more dense weakness of her R arm.    [BM]    Clinical Course User Index [BM] Noemi Chapel, MD      A code stroke was activated on arrival.  The patient  is potentially acutely ill.  Will discuss with neurology who is at the bedside at this time.  Final Clinical Impressions(s) / ED Diagnoses   Final diagnoses:  Stroke (cerebrum) ([place])  Endotracheal tube present    ED Discharge Orders    None       Noemi Chapel, MD 03/15/19 1149

## 2019-03-14 NOTE — Progress Notes (Signed)
Patient transported from ED to IR and then to 6E83 with no complications.

## 2019-03-14 NOTE — Progress Notes (Signed)
Patient ID: Monica Pearson, female   DOB: 04-11-1946, 73 y.o.   MRN: [id number] INR. 50 Y RT H F LSW ? mRSS 0 New onset RT sided weakness and RT gaze deviation with slurred speech. Partial resolution with worsening. 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 Also D/W son on the phone by me.. Procedure ,risks,alternatives reviewed. Risks oh ICH 5 to 10 %,,worsening neuro symptoms,death,inability to revascularize and vascular injury discussed. Son expressed understanding and gave consent for the treatment. S.Martise Waddell MD

## 2019-03-14 NOTE — Code Documentation (Signed)
Responded to Code stroke called at 1925 for c/o R sided droop, weakness, L sided gaze,and slurred speech.  [id number]. Symptoms resolved en route per EMS but on arrival, pt had R sided droop and weakness. NIH-6, CBG-142. CT head negative. CTA-L ICH occlusion.  TPA not given d/t history of SDH 2 months ago. Plan to intubate and bring to IR.

## 2019-03-14 NOTE — Consult Note (Signed)
NAME:  Monica Pearson MRN:  [id number] DOB:  1946-08-08 LOS: 0 ADMISSION DATE:  03/14/2019  CONSULTATION DATE:  03/14/2019 REFERRING MD:  Dr. Kerney Elbe  REASON FOR CONSULTATION:  Endotracheally intubated   Initial Pulmonary/Critical Care Consultation  Brief History   N/A  History of present illness   This 73 y.o. female reformed smoker presented to the [facility] emergency department via [facility] with complaints of slurred speech, right-sided weakness and leftward gaze preference.  Her symptoms reportedly resolved in route to the emergency department; however, in the emergency department, she developed recurring symptoms along with right-sided facial droop.  Code STROKE was called.  CT of the head was negative; however, CTA of the neck revealed left internal carotid artery occlusion.  The patient is seen in 4 and 23 after undergoing mechanical thrombectomy.  She has returned from the interventional radiology suite on mechanical ventilatory support.  We are consulted for assistance with ventilator management.  REVIEW OF SYSTEMS This patient is critically ill and cannot provide additional history nor review of systems due to endotracheally intubated.  Constitutional: No weight loss. No night sweats. No fever. No chills. No fatigue. HEENT: No headaches, dysphagia, sore throat, otalgia, nasal congestion, PND CV:  No chest pain, orthopnea, PND, swelling in lower extremities, palpitations GI:  No abdominal pain, nausea, vomiting, diarrhea, change in bowel pattern, anorexia Resp: No DOE, rest dyspnea, cough, mucus, hemoptysis, wheezing  GU: no dysuria, change in color of urine, no urgency or frequency.  No flank pain. MS:  No joint pain or swelling. No myalgias,  No decreased range of motion.  Psych:  No change in mood or affect. No memory loss. Skin: no rash or lesions.   Past Medical/Surgical/Social/Family History   Past Medical History:  Diagnosis Date  . Cirrhosis ([place])    . Diabetes mellitus without complication ([place])   . Diabetic retinopathy ([place])   . Diabetic retinopathy ([place])   . GERD (gastroesophageal reflux disease)   . High cholesterol   . Hypertension   . Sarcoidosis   . Stroke ([place])   . Subdural hematoma [facility])     Past Surgical History:  Procedure Laterality Date  . CHOLECYSTECTOMY    . KNEE SURGERY      Social History   Tobacco Use  . Smoking status: Former Research scientist (life sciences)  . Smokeless tobacco: Never Used  Substance Use Topics  . Alcohol use: No    Family History  Problem Relation Age of Onset  . Stroke Mother   . Bladder Cancer Mother   . CAD Father   . Prostate cancer Father      Significant Hospital Events   N/A   Consults:  Dr. Berton Bon NovaSure (interventional radiology)   Procedures:  03/14/2019: Left common carotid arteriogram with mechanical thrombectomy (left MCA) 03/14/2019: Left radial arterial cannulation   Significant Diagnostic Tests:  CTA head/neck (03/14/2019): Nonocclusive thrombus within the left internal carotid artery.   Micro Data:   Results for orders placed or performed during the hospital encounter of 01/13/19  Blood culture (routine x 2)     Status: None   Collection Time: 01/13/19  6:15 PM  Result Value Ref Range Status   Specimen Description   Final    BLOOD RIGHT ANTECUBITAL Performed at [facility], [address] [place], [place] [address]    Special Requests   Final    BOTTLES DRAWN AEROBIC AND ANAEROBIC Blood Culture adequate volume Performed at [facility]  [address] [place], [place] [address]    Culture   Final    NO GROWTH 5 DAYS Performed at [facility], [place] [address], [place],  [address]    Report Status 01/18/2019 FINAL  Final      Antimicrobials:  Vancomycin (4/19>>   Interim history/subjective:  N/A   Objective   BP 103/66   Pulse 66   Resp (!) 22   Ht 5' 3"  (1.6 m)   Wt 106.2 kg   SpO2 100%   BMI 41.47 kg/m      Filed Weights   03/14/19 1900 03/14/19 2014  Weight: 106.2 kg 106.2 kg    Intake/Output Summary (Last 24 hours) at 03/14/2019 2337 Last data filed at 03/14/2019 2322 Gross per 24 hour  Intake 500 ml  Output 1000 ml  Net -500 ml    Vent Mode: PRVC FiO2 (%):  [100 %] 100 % Set Rate:  [16 bmp] 16 bmp Vt Set:  [420 mL-480 mL] 420 mL PEEP:  [5 cmH20] 5 cmH20 Plateau Pressure:  [19 cmH20-21 cmH20] 19 cmH20   Examination: GENERAL: Intubated. Still under the effects of sedation, although recently discontinued.  No acute distress. HEAD: normocephalic, atraumatic EYE: PERRLA, EOM intact, no scleral icterus, no pallor. NOSE: nares are patent.  No exudate. THROAT/ORAL CAVITY: Mucous membranes are moist.  ETT in situ. NECK: supple, no thyromegaly, no JVD, no lymphadenopathy. Trachea midline. CHEST/LUNG: symmetric in development and expansion. Diminished air entry bilaterally.  Rales and rhonchi bilaterally. HEART:  Regular S1 and S2 without murmur, rub or gallop.  Bradycardia. ABDOMEN: soft, nontender, nondistended. Normoactive bowel sounds. No rebound. No guarding. No hepatosplenomegaly. EXTREMITIES: Left radial arterial line.  Edema: Trace. No cyanosis. No clubbing. 2+ DP pulses LYMPHATIC: no cervical/axillary/inguinal lymph nodes appreciated MUSCULOSKELETAL: No point tenderness. No bulk atrophy. Joints: no deformity.  SKIN:  No rash or lesion. NEUROLOGIC: Doll's eyes intact. Spontaneous respirations intact. Facial symmetry.  B Babinski equivocal.  No sensory deficit.  Motor: 3/5 @ RUE, 3/5 @ LUE, 3/5 @ RLL,  3/5 @ LLL.  DTR: 1+ @ R biceps, 1+ @ L biceps, 1+ @ R patellar,  1+ @ L patellar. Gait was not assessed.   Resolved Hospital Problem list   N/A   Assessment & Plan:   ASSESSMENT/PLAN:  ASSESSMENT (included in the Hospital Problem List)  Principal Problem:   Stroke (cerebrum) ([place]) Active Problems:   Endotracheally intubated   Stenosis of left carotid artery   Bradycardia    Thrombocytopenia (HCC)   GERD (gastroesophageal reflux disease)   Sarcoidosis of lung (HCC)   Bacteriuria with pyuria   CKD stage 3 due to type 2 diabetes mellitus (HCC)   Type 2 diabetes mellitus with complication, with long-term current use of insulin (HCC)   Microcytic anemia   By systems:  NEUROLOGIC  Stroke, left MCA  Left ICA stenosis/occlusion  Previous history of subdural hematoma Close neurologic monitoring.  Stroke care per Neurology. Anticipate liberation from mechanical ventilatory support within 24 hours.  PULMONARY  Endotracheally intubated  Sarcoidosis, by history Vent settings titrated based on ABG results. CXR now Trach aspirate for Gram stain, C/S. Empiric Zosyn/vancomycin for aspiration and UTI coverage.   INFECTIOUS  Pyuria with bacteriuria Empiric Zosyn/vancomycin. Culture blood and urine.   CARDIOVASCULAR  Bradycardia  Peripheral vascular disease Cardiac monitoring   RENAL  Chronic kidney disease, stage III/IV breakpoint Avoid nephrotoxic drugs Renal dosing of medications    ENDOCRINE  Type  2 diabetes mellitus with retinopathy, nephropathy, vasculopathy  Morbid obesity Accu-Cheks.   Sliding scale insulin.   GASTROINTESTINAL  GERD   GI PROPHYLAXIS: Protonix   HEMATOLOGIC  Microcytic anemia, stable  Thrombocytopenia  DVT PROPHYLAXIS: Heparin Iron studies. Check HIT panel   PLAN/RECOMMENDATIONS   As highlighted above.     My assessment, plan of care, findings, medications, side effects, etc. were discussed with: nurse.   Best practice:  Diet: N.p.o. for now Pain/Anxiety/Delirium protocol (if indicated): Propofol/fentanyl, as needed VAP protocol (if indicated): Yes DVT prophylaxis: Heparin GI prophylaxis: Protonix Glucose control: Accu-Cheks, sliding scale insulin Mobility/Activity: Bedrest CODE STATUS: Full code Family Communication:  No family at bedside Disposition: Keep in ICU   Labs   CBC: Recent  Labs  Lab 03/14/19 1940  WBC 8.9  NEUTROABS 7.2  HGB 11.0*  HCT 30.0*  MCV 69.6*  PLT 97*    Basic Metabolic Panel: Recent Labs  Lab 03/14/19 1940 03/14/19 1941  NA 133*  --   K 4.7  --   CL 98  --   CO2 26  --   GLUCOSE 172*  --   BUN 81*  --   CREATININE 1.78* 1.70*  CALCIUM 8.3*  --    GFR: Estimated Creatinine Clearance: 34.4 mL/min (A) (by C-G formula based on SCr of 1.7 mg/dL (H)). Recent Labs  Lab 03/14/19 1940  WBC 8.9    Liver Function Tests: Recent Labs  Lab 03/14/19 1940  AST 104*  ALT 102*  ALKPHOS 225*  BILITOT 2.6*  PROT 5.8*  ALBUMIN 2.1*   No results for input(s): LIPASE, AMYLASE in the last 168 hours. Recent Labs  Lab 03/14/19 1941  AMMONIA 55*    ABG No results found for: PHART, PCO2ART, PO2ART, HCO3, TCO2, ACIDBASEDEF, O2SAT   Coagulation Profile: Recent Labs  Lab 03/14/19 1940  INR 1.6*    Cardiac Enzymes: No results for input(s): CKTOTAL, CKMB, CKMBINDEX, TROPONINI in the last 168 hours.  HbA1C: Hgb A1c MFr Bld  Date/Time Value Ref Range Status  01/16/2019 05:49 AM 7.1 (H) 4.8 - 5.6 % Final    Comment:    (NOTE) Pre diabetes:          5.7%-6.4% Diabetes:              >6.4% Glycemic control for   <7.0% adults with diabetes   12/15/2016 02:03 AM 8.3 (H) 4.8 - 5.6 % Final    Comment:    (NOTE)         Pre-diabetes: 5.7 - 6.4         Diabetes: >6.4         Glycemic control for adults with diabetes: <7.0     CBG: Recent Labs  Lab 03/14/19 2333  GLUCAP 145*     Review of Systems:   See above   Past Medical History   Past Medical History:  Diagnosis Date  . Cirrhosis ([place])   . Diabetes mellitus without complication ([place])   . Diabetic retinopathy ([place])   . Diabetic retinopathy ([place])   . GERD (gastroesophageal reflux disease)   . High cholesterol   . Hypertension   . Sarcoidosis   . Stroke ([place])   . Subdural hematoma [facility])       Surgical History    Past Surgical History:  Procedure  Laterality Date  . CHOLECYSTECTOMY    . KNEE SURGERY        Social History   Social History   Socioeconomic History  .  Marital status: Legally Separated    Spouse name: Not on file  . Number of children: Not on file  . Years of education: Not on file  . Highest education level: Not on file  Occupational History  . Not on file  Social Needs  . Financial resource strain: Not on file  . Food insecurity:    Worry: Not on file    Inability: Not on file  . Transportation needs:    Medical: Not on file    Non-medical: Not on file  Tobacco Use  . Smoking status: Former Research scientist (life sciences)  . Smokeless tobacco: Never Used  Substance and Sexual Activity  . Alcohol use: No  . Drug use: No  . Sexual activity: Not on file  Lifestyle  . Physical activity:    Days per week: Not on file    Minutes per session: Not on file  . Stress: Not on file  Relationships  . Social connections:    Talks on phone: Not on file    Gets together: Not on file    Attends religious service: Not on file    Active member of club or organization: Not on file    Attends meetings of clubs or organizations: Not on file    Relationship status: Not on file  Other Topics Concern  . Not on file  Social History Narrative  . Not on file      Family History    Family History  Problem Relation Age of Onset  . Stroke Mother   . Bladder Cancer Mother   . CAD Father   . Prostate cancer Father    family history includes Bladder Cancer in her mother; CAD in her father; Prostate cancer in her father; Stroke in her mother.    Allergies  Allergen Reactions  . Celecoxib Swelling    Ankles swell  . Rofecoxib Swelling    Ankles swell  . Ciprofloxacin Itching  . Darvon [Propoxyphene] Nausea And Vomiting  . Hydrocodone-Acetaminophen Nausea And Vomiting  . Morphine Nausea And Vomiting     Current Medications  Current Facility-Administered Medications:  .   stroke: mapping our early stages of recovery book, , Does  not apply, Once, Kerney Elbe, MD .  0.9 %  sodium chloride infusion, , Intravenous, Continuous, Kerney Elbe, MD .  eptifibatide (INTEGRILIN) 20 MG/10ML injection, , , ,  .  [START ON 03/15/2019] furosemide (LASIX) tablet 20 mg, 20 mg, Oral, Daily, Kerney Elbe, MD .  Derrill Memo ON 03/15/2019] insulin aspart (novoLOG) injection 0-15 Units, 0-15 Units, Subcutaneous, Q4H, Kerney Elbe, MD .  lactulose ([place]) 10 GM/15ML solution 10 g, 10 g, Oral, BID, Kerney Elbe, MD .  Magnesium CAPS 500 mg, 500 mg, Oral, QHS, Kerney Elbe, MD .  nitroGLYCERIN 100 mcg/mL intra-arterial injection, , , ,  .  norepinephrine (LEVOPHED) 4-5 MG/250ML-% infusion SOLN, , , ,  .  phenylephrine (NEOSYNEPHRINE) 10-0.9 MG/250ML-% infusion, 0-400 mcg/min, Intravenous, Titrated, [place], Guadelupe Sabin, MD .  propofol (DIPRIVAN) 1000 MG/100ML infusion, 5-80 mcg/kg/min, Intravenous, Continuous, Noemi Chapel, MD, Last Rate: 25.5 mL/hr at 03/14/19 2255, 40 mcg/kg/min at 03/14/19 2255 .  propranolol (INDERAL) tablet 10 mg, 10 mg, Oral, BID, Kerney Elbe, MD .  senna-docusate (Senokot-S) tablet 1 tablet, 1 tablet, Oral, QHS PRN, Kerney Elbe, MD .  Derrill Memo ON 03/15/2019] spironolactone (ALDACTONE) tablet 50 mg, 50 mg, Oral, Daily, Kerney Elbe, MD .  vancomycin (VANCOCIN) 1-5 GM/200ML-% IVPB, , , ,   Home Medications  Prior to Admission medications  Medication Sig Start Date End Date Taking? Authorizing Provider  Calcium-Magnesium-Vitamin D (CALCIUM 1200+D3 PO) Take 2 tablets by mouth at bedtime.    [provider]  ezetimibe (ZETIA) 10 MG tablet Take 10 mg by mouth every morning. 11/06/16 01/14/19  [provider]  furosemide (LASIX) 20 MG tablet Take 20 mg by mouth daily. 12/03/18   [provider]  glipiZIDE (GLUCOTROL XL) 10 MG 24 hr tablet Take 10 mg by mouth 2 (two) times daily.    [provider]  insulin lispro (HUMALOG KWIKPEN) 100 UNIT/ML KwikPen Inject 15 Units into the skin 3  (three) times daily with meals. MAX OF 50 UNITS/DAY     [provider]  lactulose (CHRONULAC) 10 GM/15ML solution Take 15 mLs (10 g total) by mouth 2 (two) times daily. 01/16/19   Florencia Reasons, MD  liraglutide (VICTOZA) 18 MG/3ML SOPN Inject 1.8 mg into the skin at bedtime.    [provider]  Magnesium 500 MG CAPS Take 500 mg by mouth at bedtime.    [provider]  metFORMIN (GLUCOPHAGE) 500 MG tablet Take 500 mg by mouth 2 (two) times daily with a meal.    [provider]  propranolol (INDERAL) 10 MG tablet Take 1 tablet (10 mg total) by mouth 2 (two) times daily. 01/16/19   Florencia Reasons, MD  spironolactone (ALDACTONE) 50 MG tablet Take 1 tablet (50 mg total) by mouth daily for 30 days. 01/16/19 02/15/19  Florencia Reasons, MD      Critical care time: 45 minutes.  The treatment and management of the patient's condition was required based on the threat of imminent deterioration. This time reflects time spent by the physician evaluating, providing care and managing the critically ill patient's care. The time was spent at the immediate bedside (or on the same floor/unit and dedicated to this patient's care). Time involved in separately billable procedures is NOT included int he critical care time indicated above. Family meeting and update time may be included above if and only if the patient is unable/incompetent to participate in clinical interview and/or decision making, and the discussion was necessary to determining treatment decisions.    Renee Pain, MD Board Certified by the ABIM, [place] Pager: [phone]

## 2019-03-15 ENCOUNTER — Inpatient Hospital Stay (HOSPITAL_COMMUNITY): Payer: Medicare Other

## 2019-03-15 ENCOUNTER — Encounter (HOSPITAL_COMMUNITY): Payer: Self-pay | Admitting: Pulmonary Disease

## 2019-03-15 DIAGNOSIS — D696 Thrombocytopenia, unspecified: Secondary | ICD-10-CM | POA: Diagnosis present

## 2019-03-15 DIAGNOSIS — I639 Cerebral infarction, unspecified: Secondary | ICD-10-CM

## 2019-03-15 DIAGNOSIS — R8281 Pyuria: Secondary | ICD-10-CM

## 2019-03-15 DIAGNOSIS — Z8673 Personal history of transient ischemic attack (TIA), and cerebral infarction without residual deficits: Secondary | ICD-10-CM

## 2019-03-15 DIAGNOSIS — Z978 Presence of other specified devices: Secondary | ICD-10-CM | POA: Diagnosis present

## 2019-03-15 DIAGNOSIS — D860 Sarcoidosis of lung: Secondary | ICD-10-CM | POA: Diagnosis present

## 2019-03-15 DIAGNOSIS — N183 Chronic kidney disease, stage 3 (moderate): Secondary | ICD-10-CM

## 2019-03-15 DIAGNOSIS — Z794 Long term (current) use of insulin: Secondary | ICD-10-CM

## 2019-03-15 DIAGNOSIS — E1122 Type 2 diabetes mellitus with diabetic chronic kidney disease: Secondary | ICD-10-CM | POA: Diagnosis present

## 2019-03-15 DIAGNOSIS — R8271 Bacteriuria: Secondary | ICD-10-CM | POA: Diagnosis present

## 2019-03-15 DIAGNOSIS — E118 Type 2 diabetes mellitus with unspecified complications: Secondary | ICD-10-CM

## 2019-03-15 DIAGNOSIS — D509 Iron deficiency anemia, unspecified: Secondary | ICD-10-CM | POA: Diagnosis present

## 2019-03-15 DIAGNOSIS — R001 Bradycardia, unspecified: Secondary | ICD-10-CM | POA: Diagnosis present

## 2019-03-15 DIAGNOSIS — J988 Other specified respiratory disorders: Secondary | ICD-10-CM

## 2019-03-15 LAB — CBC WITH DIFFERENTIAL/PLATELET
Abs Immature Granulocytes: 0.02 10*3/uL (ref 0.00–0.07)
Basophils Absolute: 0 10*3/uL (ref 0.0–0.1)
Basophils Relative: 0 %
Eosinophils Absolute: 0.1 10*3/uL (ref 0.0–0.5)
Eosinophils Relative: 1 %
HCT: UNDETERMINED % (ref 36.0–46.0)
Hemoglobin: 9.5 g/dL — ABNORMAL LOW (ref 12.0–15.0)
Immature Granulocytes: 0 %
Lymphocytes Relative: 16 %
Lymphs Abs: 1.2 10*3/uL (ref 0.7–4.0)
MCH: UNDETERMINED pg (ref 26.0–34.0)
MCHC: UNDETERMINED g/dL (ref 30.0–36.0)
MCV: UNDETERMINED fL (ref 80.0–100.0)
Monocytes Absolute: 0.8 10*3/uL (ref 0.1–1.0)
Monocytes Relative: 11 %
Neutro Abs: 5.3 10*3/uL (ref 1.7–7.7)
Neutrophils Relative %: 72 %
Platelets: 83 10*3/uL — ABNORMAL LOW (ref 150–400)
RBC: UNDETERMINED MIL/uL (ref 3.87–5.11)
RDW: UNDETERMINED % (ref 11.5–15.5)
WBC: 7.4 10*3/uL (ref 4.0–10.5)
nRBC: 0.4 % — ABNORMAL HIGH (ref 0.0–0.2)

## 2019-03-15 LAB — ECHOCARDIOGRAM LIMITED
Height: 63 in
Weight: 3746.06 oz

## 2019-03-15 LAB — BLOOD GAS, ARTERIAL
Acid-base deficit: 1.8 mmol/L (ref 0.0–2.0)
Acid-base deficit: 2.8 mmol/L — ABNORMAL HIGH (ref 0.0–2.0)
Bicarbonate: 21.2 mmol/L (ref 20.0–28.0)
Bicarbonate: 21.8 mmol/L (ref 20.0–28.0)
Drawn by: 44135
Drawn by: 441351
FIO2: 1
FIO2: 40
MECHVT: 420 mL
MECHVT: 420 mL
O2 Saturation: 99.2 %
O2 Saturation: 99.5 %
PEEP: 5 cmH2O
PEEP: 5 cmH2O
Patient temperature: 98.6
Patient temperature: 98.6
RATE: 15 resp/min
RATE: 16 resp/min
pCO2 arterial: 32.3 mmHg (ref 32.0–48.0)
pCO2 arterial: 34.5 mmHg (ref 32.0–48.0)
pH, Arterial: 7.405 (ref 7.350–7.450)
pH, Arterial: 7.443 (ref 7.350–7.450)
pO2, Arterial: 163 mmHg — ABNORMAL HIGH (ref 83.0–108.0)
pO2, Arterial: 347 mmHg — ABNORMAL HIGH (ref 83.0–108.0)

## 2019-03-15 LAB — LIPID PANEL
Cholesterol: 165 mg/dL (ref 0–200)
HDL: 40 mg/dL — ABNORMAL LOW (ref >40)
LDL Cholesterol: 115 mg/dL — ABNORMAL HIGH (ref 0–99)
Total CHOL/HDL Ratio: 4.1 RATIO
Triglycerides: 51 mg/dL (ref <150)
VLDL: 10 mg/dL (ref 0–40)

## 2019-03-15 LAB — BASIC METABOLIC PANEL
Anion gap: 9 (ref 5–15)
BUN: 76 mg/dL — ABNORMAL HIGH (ref 8–23)
CO2: 22 mmol/L (ref 22–32)
Calcium: 8.1 mg/dL — ABNORMAL LOW (ref 8.9–10.3)
Chloride: 103 mmol/L (ref 98–111)
Creatinine, Ser: 1.72 mg/dL — ABNORMAL HIGH (ref 0.44–1.00)
GFR calc Af Amer: 34 mL/min — ABNORMAL LOW (ref >60)
GFR calc non Af Amer: 29 mL/min — ABNORMAL LOW (ref >60)
Glucose, Bld: 106 mg/dL — ABNORMAL HIGH (ref 70–99)
Potassium: 3.5 mmol/L (ref 3.5–5.1)
Sodium: 134 mmol/L — ABNORMAL LOW (ref 135–145)

## 2019-03-15 LAB — GLUCOSE, CAPILLARY
Glucose-Capillary: 112 mg/dL — ABNORMAL HIGH (ref 70–99)
Glucose-Capillary: 113 mg/dL — ABNORMAL HIGH (ref 70–99)
Glucose-Capillary: 147 mg/dL — ABNORMAL HIGH (ref 70–99)
Glucose-Capillary: 45 mg/dL — ABNORMAL LOW (ref 70–99)
Glucose-Capillary: 53 mg/dL — ABNORMAL LOW (ref 70–99)
Glucose-Capillary: 68 mg/dL — ABNORMAL LOW (ref 70–99)
Glucose-Capillary: 73 mg/dL (ref 70–99)
Glucose-Capillary: 77 mg/dL (ref 70–99)
Glucose-Capillary: 81 mg/dL (ref 70–99)
Glucose-Capillary: 86 mg/dL (ref 70–99)

## 2019-03-15 LAB — TRIGLYCERIDES: Triglycerides: 51 mg/dL (ref <150)

## 2019-03-15 LAB — HEMOGLOBIN A1C
Hgb A1c MFr Bld: 6.2 % — ABNORMAL HIGH (ref 4.8–5.6)
Mean Plasma Glucose: 131 mg/dL

## 2019-03-15 LAB — IRON AND TIBC
Iron: 96 ug/dL (ref 28–170)
Saturation Ratios: 48 % — ABNORMAL HIGH (ref 10.4–31.8)
TIBC: 200 ug/dL — ABNORMAL LOW (ref 250–450)
UIBC: 104 ug/dL

## 2019-03-15 LAB — MRSA PCR SCREENING: MRSA by PCR: NEGATIVE

## 2019-03-15 LAB — FERRITIN: Ferritin: 236 ng/mL (ref 11–307)

## 2019-03-15 MED ORDER — NOREPINEPHRINE 4 MG/250ML-% IV SOLN
2.0000 ug/min | INTRAVENOUS | Status: DC
  Administered 2019-03-15: 16:00:00 12 ug/min via INTRAVENOUS
  Administered 2019-03-15: 20:00:00 11 ug/min via INTRAVENOUS
  Administered 2019-03-15: 10:00:00 4 ug/min via INTRAVENOUS
  Administered 2019-03-16: 11:00:00 5 ug/min via INTRAVENOUS
  Administered 2019-03-16: 04:00:00 9 ug/min via INTRAVENOUS
  Administered 2019-03-16: 23:00:00 6 ug/min via INTRAVENOUS
  Filled 2019-03-15 (×6): qty 250

## 2019-03-15 MED ORDER — PIPERACILLIN-TAZOBACTAM 3.375 G IVPB 30 MIN
3.3750 g | Freq: Once | INTRAVENOUS | Status: AC
  Administered 2019-03-15: 01:00:00 3.375 g via INTRAVENOUS
  Filled 2019-03-15 (×2): qty 50

## 2019-03-15 MED ORDER — DEXTROSE 50 % IV SOLN
INTRAVENOUS | Status: AC
  Administered 2019-03-15: 50 mL
  Filled 2019-03-15: qty 50

## 2019-03-15 MED ORDER — ALBUMIN HUMAN 25 % IV SOLN
12.5000 g | Freq: Once | INTRAVENOUS | Status: AC
  Administered 2019-03-15: 20:00:00 12.5 g via INTRAVENOUS
  Filled 2019-03-15: qty 100

## 2019-03-15 MED ORDER — SODIUM CHLORIDE 0.9% FLUSH
3.0000 mL | Freq: Two times a day (BID) | INTRAVENOUS | Status: DC
  Administered 2019-03-15: 3 mL via INTRAVENOUS

## 2019-03-15 MED ORDER — SODIUM CHLORIDE 0.9 % IV SOLN: 250.0000 mL | INTRAVENOUS | Status: DC | PRN

## 2019-03-15 MED ORDER — ALBUMIN HUMAN 25 % IV SOLN: 25.0000 g | Freq: Once | INTRAVENOUS | Status: DC

## 2019-03-15 MED ORDER — SODIUM CHLORIDE 0.9 % IV SOLN
INTRAVENOUS | Status: DC | PRN
  Administered 2019-03-16: 01:00:00 250 mL via INTRAVENOUS

## 2019-03-15 MED ORDER — SODIUM CHLORIDE 0.9% FLUSH: 10.0000 mL | INTRAVENOUS | Status: DC | PRN

## 2019-03-15 MED ORDER — DEXTROSE-NACL 5-0.45 % IV SOLN
INTRAVENOUS | Status: DC
  Administered 2019-03-15 (×2): via INTRAVENOUS

## 2019-03-15 MED ORDER — PROPOFOL 1000 MG/100ML IV EMUL
0.0000 ug/kg/min | INTRAVENOUS | Status: DC
  Administered 2019-03-15: 10 ug/kg/min via INTRAVENOUS

## 2019-03-15 MED ORDER — PIPERACILLIN-TAZOBACTAM 3.375 G IVPB
3.3750 g | Freq: Three times a day (TID) | INTRAVENOUS | Status: DC
  Administered 2019-03-15 – 2019-03-17 (×7): 3.375 g via INTRAVENOUS
  Filled 2019-03-15 (×7): qty 50

## 2019-03-15 MED ORDER — SODIUM CHLORIDE 0.9 % IV SOLN
INTRAVENOUS | Status: DC
  Administered 2019-03-15: 09:00:00 via INTRAVENOUS

## 2019-03-15 MED ORDER — PHENYLEPHRINE HCL-NACL 10-0.9 MG/250ML-% IV SOLN
25.0000 ug/min | INTRAVENOUS | Status: DC
  Administered 2019-03-15: 10:00:00 150 ug/min via INTRAVENOUS
  Filled 2019-03-15: qty 250

## 2019-03-15 MED ORDER — SODIUM CHLORIDE 0.9% FLUSH
10.0000 mL | Freq: Two times a day (BID) | INTRAVENOUS | Status: DC
  Administered 2019-03-15 – 2019-03-17 (×6): 10 mL

## 2019-03-15 MED ORDER — PANTOPRAZOLE SODIUM 40 MG PO TBEC
40.0000 mg | DELAYED_RELEASE_TABLET | Freq: Every day | ORAL | Status: DC
  Administered 2019-03-16 – 2019-03-18 (×3): 40 mg via ORAL
  Filled 2019-03-15 (×3): qty 1

## 2019-03-15 MED ORDER — DEXTROSE 50 % IV SOLN
25.0000 g | INTRAVENOUS | Status: AC
  Administered 2019-03-15: 25 g via INTRAVENOUS

## 2019-03-15 MED ORDER — ALBUMIN HUMAN 25 % IV SOLN
25.0000 g | Freq: Four times a day (QID) | INTRAVENOUS | Status: DC
  Administered 2019-03-15 (×2): 25 g via INTRAVENOUS
  Filled 2019-03-15 (×2): qty 50
  Filled 2019-03-15: qty 100
  Filled 2019-03-15: qty 50

## 2019-03-15 MED ORDER — SODIUM CHLORIDE 0.9% FLUSH: 3.0000 mL | INTRAVENOUS | Status: DC | PRN

## 2019-03-15 MED ORDER — PANTOPRAZOLE SODIUM 40 MG IV SOLR
40.0000 mg | INTRAVENOUS | Status: DC
  Administered 2019-03-15: 01:00:00 40 mg via INTRAVENOUS
  Filled 2019-03-15 (×2): qty 40

## 2019-03-15 MED ORDER — SODIUM CHLORIDE 0.9 % IV SOLN: 250.0000 mL | INTRAVENOUS | Status: DC

## 2019-03-15 NOTE — Progress Notes (Signed)
Patient extremely agitated, attempting to pull ETT out, pulling at lines, unable to sedate r/t blood pressures. Limited IV access, noted. Multiple attempts to get new IV. Will continue to monitor. Lianne Bushy RN BSN.

## 2019-03-15 NOTE — Progress Notes (Addendum)
NAME:  Monica Pearson, MRN:  [id number], DOB:  May 13, 1946, LOS: 1 ADMISSION DATE:  03/14/2019, CONSULTATION DATE:  4/19 REFERRING MD:  Dr. Cheral Marker, CHIEF COMPLAINT:  AMS, R sided weakness   Brief History   73 y/o F, Jehovah's Witness, admitted 4/19 with reports of slurred speech, right-sided weakness and leftward gaze.  CT head negative but CTA neck revealed left internal carotid artery occlusion.  She underwent IR endovascular revascularization with mechanical thrombectomy and was returned to ICU on mechanical ventilation.     Past Medical History  Sarcoidosis  HLD HTN GERD NASH Cirrhosis  SDH  DM  Significant Hospital Events   4/19  Admit   Consults:  PCCM   Procedures:  4/19 IR Endovascular Revascularization of L ICA, mechanical thrombectomy  Significant Diagnostic Tests:  CT Head 4/19 >> negative  CTA Neck 4/19 >> non-occlusive thrombus within the left ICA, no intracranial occlusion or stenosis  Micro Data:  BCx2 4/20 >>  UC 4/20 >>  Tracheal aspirate 4/20 >>   Antimicrobials:  Zosyn 4/19 >>   Interim history/subjective:  RN reports limited IV access, pt on neo at 200 mcg via PIV.  BP's soft despite pressors.  MAP ~ 60.   Objective   Blood pressure (!) 109/45, pulse 80, temperature 98.6 F (37 C), temperature source Axillary, resp. rate 14, height 5' 3"  (1.6 m), weight 106.2 kg, SpO2 100 %.    Vent Mode: PRVC FiO2 (%):  [40 %-100 %] 40 % Set Rate:  [16 bmp] 16 bmp Vt Set:  [420 mL-480 mL] 420 mL PEEP:  [5 cmH20] 5 cmH20 Plateau Pressure:  [19 cmH20-21 cmH20] 19 cmH20   Intake/Output Summary (Last 24 hours) at 03/15/2019 0945 Last data filed at 03/15/2019 0900 Gross per 24 hour  Intake 2096.12 ml  Output 1800 ml  Net 296.12 ml   Filed Weights   03/14/19 1900 03/14/19 2014  Weight: 106.2 kg 106.2 kg    Examination: General: elderly female lying in bed on vent in NAD HEENT: MM pink/moist, ETT Neuro: awakens to voice, follows commands appropriately / MAE  CV: s1s2 rrr, no m/r/g PULM: even/non-labored, lungs bilaterally clear BE:MLJQ, non-tender, bsx4 active  Extremities: warm/dry, no edema  Skin: no rashes or lesions  Resolved Hospital Problem list      Assessment & Plan:   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  -s/p neuroIR revascularization 4/20 -hx of SDH P: Per Neurology   Acute Respiratory Insufficiency  -in setting of CVA R Basilar Atelectasis  P: PRVC 8cc/kg as rest mode  PSV with goal for extubation  Wean PEEP / fiO2 for sats > 90% Follow cultures Intermittent CXR  Empiric zosyn  Hypotension  P: Albumin x1  Change neo to levophed via PIV  Suspect component sedation in setting of liver / renal disease > consider change to precedex if not extubated 4/20 IV team to place midline   Hx Sarcoidosis  P: Monitor / supportive care  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  P: Continue lactulose  Follow intermittent ammonia   Microcytic Anemia  Jehovah's Witness  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 Thrombocytopenia  P: Trend CBC  Transfuse per ICU guidelines  HIT panel pending > sent on admit   CKD III/IV P: Trend BMP / urinary output Replace electrolytes as indicated Avoid nephrotoxic agents, ensure adequate renal perfusion  DM  P: SSI  Best practice:  Diet: NPO Pain/Anxiety/Delirium protocol (if indicated): in place  VAP protocol (if indicated):  in place DVT prophylaxis: SCD's  GI prophylaxis: PPI  Glucose control: n/a Mobility: bedrest  Code Status: Full Code  Family Communication: Son Dorita Fray) updated 4/20 via phone Disposition:   Labs   CBC: Recent Labs  Lab 03/14/19 1940 03/15/19 0354  WBC 8.9 7.4  NEUTROABS 7.2 5.3  HGB 11.0* 9.5*  HCT 30.0* Unable to determine due to a cold agglutinin  MCV 69.6* Unable to determine due to a cold agglutinin  PLT 97* 83*    Basic Metabolic Panel: Recent Labs  Lab 03/14/19 1940 03/14/19 1941 03/15/19 0354  NA 133*  --  134*   K 4.7  --  3.5  CL 98  --  103  CO2 26  --  22  GLUCOSE 172*  --  106*  BUN 81*  --  76*  CREATININE 1.78* 1.70* 1.72*  CALCIUM 8.3*  --  8.1*   GFR: Estimated Creatinine Clearance: 34 mL/min (A) (by C-G formula based on SCr of 1.72 mg/dL (H)). Recent Labs  Lab 03/14/19 1940 03/15/19 0354  WBC 8.9 7.4    Liver Function Tests: Recent Labs  Lab 03/14/19 1940  AST 104*  ALT 102*  ALKPHOS 225*  BILITOT 2.6*  PROT 5.8*  ALBUMIN 2.1*   No results for input(s): LIPASE, AMYLASE in the last 168 hours. Recent Labs  Lab 03/14/19 1941  AMMONIA 55*    ABG    Component Value Date/Time   PHART 7.443 03/15/2019 0600   PCO2ART 32.3 03/15/2019 0600   PO2ART 163 (H) 03/15/2019 0600   HCO3 21.8 03/15/2019 0600   ACIDBASEDEF 1.8 03/15/2019 0600   O2SAT 99.2 03/15/2019 0600     Coagulation Profile: Recent Labs  Lab 03/14/19 1940  INR 1.6*    Cardiac Enzymes: No results for input(s): CKTOTAL, CKMB, CKMBINDEX, TROPONINI in the last 168 hours.  HbA1C: Hgb A1c MFr Bld  Date/Time Value Ref Range Status  01/16/2019 05:49 AM 7.1 (H) 4.8 - 5.6 % Final    Comment:    (NOTE) Pre diabetes:          5.7%-6.4% Diabetes:              >6.4% Glycemic control for   <7.0% adults with diabetes   12/15/2016 02:03 AM 8.3 (H) 4.8 - 5.6 % Final    Comment:    (NOTE)         Pre-diabetes: 5.7 - 6.4         Diabetes: >6.4         Glycemic control for adults with diabetes: <7.0     CBG: Recent Labs  Lab 03/14/19 1928 03/14/19 2333 03/15/19 0353 03/15/19 0805 03/15/19 0910  GLUCAP 147* 145* 112* 68* 113*    Critical care time: 35 minutes     Noe Gens, NP-C [facility] Pgr: [phone] or if no answer [phone] 03/15/2019, 9:46 AM

## 2019-03-15 NOTE — Procedures (Signed)
Extubation Procedure Note  Patient Details:   Name: Monica Pearson DOB: 08/18/1946 MRN: [id number]   Airway Documentation:    Vent end date: 03/15/19 Vent end time: 1117   Evaluation  O2 sats: stable throughout Complications: No apparent complications Patient did tolerate procedure well. Bilateral Breath Sounds: Clear   Yes.  Pt extubated per physician order. Pt extubated to 4L nasal cannula. Pt able to speak name, cough and no stridor heard. RT will continue to monitor.   [name] Zayda Angell 03/15/2019, 11:21 AM

## 2019-03-15 NOTE — Progress Notes (Signed)
Spoke with Radonna Ricker, IR PA, regarding patients blood pressure, in agreement with plan to switch pressors to attempt to maintain blood pressure goals. IV team at bedside to place midline. Lianne Bushy RN BSN.

## 2019-03-15 NOTE — Evaluation (Signed)
Occupational Therapy Evaluation Patient Details Name: Monica Pearson MRN: [id number] DOB: [birth year] Today's Date: 03/15/2019    History of Present Illness Patient is a 73 y/o female who presents with slurred speech, right sided weakness, left gaze preference. Head CT-unremarkable. 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 Also noted to have posssible aspiration PNA and UTI? PMH includes SDH, CVA. HTN, DM, cirrhosis.   Clinical Impression   Pt admitted with above. She demonstrates the below listed deficits and will benefit from continued OT to maximize safety and independence with BADLs.  Pt presents to OT with Rt hemiparesis, impaired cognition, decreased balance, and decreased activity tolerance. She currently requires mod - total A for ADLs and mod A +2 for sit to stand and side stepping up EOB.  She reports she lives alone and was fully independent PTA.   Recommend CIR to allow her to maximize independence and safety with ADLs.       Follow Up Recommendations  CIR    Equipment Recommendations  None recommended by OT    Recommendations for Other Services Rehab consult     Precautions / Restrictions Precautions Precautions: Fall      Mobility Bed Mobility Overal bed mobility: Needs Assistance Bed Mobility: Rolling;Sidelying to Sit;Sit to Sidelying Rolling: Max assist;+2 for physical assistance Sidelying to sit: Max assist;+2 for physical assistance     Sit to sidelying: Mod assist General bed mobility comments: ASsist with LEs, bottom and to elevate trunk to get to EOB. Needed assist to bring LEs to return to supine.  Transfers Overall transfer level: Needs assistance Equipment used: 2 person hand held assist Transfers: Sit to/from Stand Sit to Stand: Mod assist;+2 physical assistance         General transfer comment: Assist to power to standing with cues for forward weight shift; RLE initially locked out into genu recurvatum.     Balance Overall  balance assessment: Needs assistance Sitting-balance support: Feet supported;Single extremity supported Sitting balance-Leahy Scale: Fair     Standing balance support: During functional activity;Bilateral upper extremity supported Standing balance-Leahy Scale: Poor Standing balance comment: requires external support for standing                           ADL either performed or assessed with clinical judgement   ADL Overall ADL's : Needs assistance/impaired Eating/Feeding: NPO   Grooming: Wash/dry hands;Wash/dry face;Oral care;Brushing hair;Moderate assistance;Sitting   Upper Body Bathing: Maximal assistance;Sitting   Lower Body Bathing: Maximal assistance;Sit to/from stand   Upper Body Dressing : Moderate assistance;Sitting   Lower Body Dressing: Maximal assistance;Sit to/from stand   Toilet Transfer: Moderate assistance;+2 for physical assistance;+2 for safety/equipment;BSC   Toileting- Clothing Manipulation and Hygiene: Total assistance;Sit to/from stand       Functional mobility during ADLs: Moderate assistance;+2 for physical assistance;+2 for safety/equipment General ADL Comments: Pt limited by weakness, lethargy and decreased attention      Vision Baseline Vision/History: No visual deficits Patient Visual Report: No change from baseline Additional Comments: Pt unable to participate in formal assessment, but able to read the clock with min cues      Perception Perception Perception Tested?: Yes   Praxis      Pertinent Vitals/Pain Pain Assessment: No/denies pain     Hand Dominance Right   Extremity/Trunk Assessment Upper Extremity Assessment Upper Extremity Assessment: RUE deficits/detail RUE Deficits / Details: grossly Brunnstrom stage V  RUE Coordination: decreased fine motor;decreased gross motor   Lower Extremity Assessment  Lower Extremity Assessment: Defer to PT evaluation       Communication Communication Communication: Expressive  difficulties(low raspy voice )   Cognition Arousal/Alertness: Lethargic Behavior During Therapy: WFL for tasks assessed/performed Overall Cognitive Status: Impaired/Different from baseline Area of Impairment: Orientation;Attention;Following commands;Problem solving;Safety/judgement                 Orientation Level: Disoriented to;Situation Current Attention Level: Focused   Following Commands: Follows one step commands with increased time(multimodal cues ) Safety/Judgement: Decreased awareness of safety;Decreased awareness of deficits   Problem Solving: Slow processing;Decreased initiation;Difficulty sequencing;Requires verbal cues;Requires tactile cues General Comments: Lethargic during session needing repetition to answer questions. Knows it is April but needs options to state the year. When asked why she was in the hospital, "the hashbrowns" and then laughs. not able to state situation. I   General Comments  VSS     Exercises     Shoulder Instructions      Home Living Family/patient expects to be discharged to:: Private residence Living Arrangements: Alone Available Help at Discharge: Family;Available PRN/intermittently Type of Home: House Home Access: Stairs to enter [employer] of Steps: 1 in front vs 5 in back Entrance Stairs-Rails: Right Home Layout: One level     Bathroom Shower/Tub: Occupational psychologist: Standard     Home Equipment: Environmental consultant - 2 wheels;Shower seat          Prior Functioning/Environment Level of Independence: Independent with assistive device(s)        Comments: Uses RW for ambulation; drives. Crochets at home        OT Problem List: Decreased strength;Decreased activity tolerance;Impaired balance (sitting and/or standing);Impaired vision/perception;Decreased coordination;Decreased cognition;Decreased safety awareness;Decreased knowledge of use of DME or AE;Obesity;Impaired UE functional use;Increased edema       OT Treatment/Interventions: Self-care/ADL training;Neuromuscular education;DME and/or AE instruction;Therapeutic activities;Cognitive remediation/compensation;Visual/perceptual remediation/compensation;Patient/family education;Balance training    OT Goals(Current goals can be found in the care plan section) Acute Rehab OT Goals Patient Stated Goal: to get something to drink/eat OT Goal Formulation: With patient Time For Goal Achievement: 03/29/19 Potential to Achieve Goals: Good  OT Frequency: Min 2X/week   Barriers to D/C: Decreased caregiver support  unsure if family able to provide necessary level of assist        Co-evaluation PT/OT/SLP Co-Evaluation/Treatment: Yes Reason for Co-Treatment: For patient/therapist safety;Necessary to address cognition/behavior during functional activity;To address functional/ADL transfers   OT goals addressed during session: ADL's and self-care      AM-PAC OT "6 Clicks" Daily Activity     Outcome Measure Help from another person eating meals?: Total Help from another person taking care of personal grooming?: A Lot Help from another person toileting, which includes using toliet, bedpan, or urinal?: A Lot Help from another person bathing (including washing, rinsing, drying)?: A Lot Help from another person to put on and taking off regular upper body clothing?: A Lot Help from another person to put on and taking off regular lower body clothing?: Total 6 Click Score: 10   End of Session Nurse Communication: Mobility status  Activity Tolerance: Patient limited by lethargy Patient left: in bed;with call bell/phone within reach;with bed alarm set  OT Visit Diagnosis: Unsteadiness on feet (R26.81);Muscle weakness (generalized) (M62.81)                Time: 1025-8527 OT Time Calculation (min): 28 min Charges:  OT General Charges $OT Visit: 1 Visit OT Evaluation $OT Eval Moderate Complexity: 1 Mod  Beckham Capistran, OTR/L  Acute Rehabilitation  Services Pager [phone] Office [phone]   Lucille Passy M 03/15/2019, 7:07 PM

## 2019-03-15 NOTE — Progress Notes (Signed)
NP Brandy, aware of patient not hitting BP goals, and inability to titrate with peripheral line. New orders received and carried out. Will continue to monitor. Lianne Bushy RN BSN.

## 2019-03-15 NOTE — Progress Notes (Signed)
  Echocardiogram 2D Echocardiogram has been performed.  Johny Chess 03/15/2019, 9:20 AM

## 2019-03-15 NOTE — Progress Notes (Signed)
STROKE TEAM PROGRESS NOTE   INTERVAL HISTORY Pt RN and CCM NP at bedside. Just turned off sedation and pt seems more awake and slight open eyes and able to follow simple commands. Plan to extubate today. Still on pressors for low BP.   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  MCV 69.6* Unable to determine due to a cold agglutinin  PLT 97* 83*    Basic Metabolic Panel:  Recent Labs  Lab 03/14/19 1940 03/14/19 1941 03/15/19 0354  NA 133*  --  134*  K 4.7  --  3.5  CL 98  --  103  CO2 26  --  22  GLUCOSE 172*  --  106*  BUN 81*  --  76*  CREATININE 1.78* 1.70* 1.72*  CALCIUM 8.3*  --  8.1*   Lipid Panel:     Component Value Date/Time   CHOL 165 03/15/2019 0354   TRIG 51 03/15/2019 0354   TRIG 51 03/15/2019 0354   HDL 40 (L) 03/15/2019 0354   CHOLHDL 4.1 03/15/2019 0354   VLDL 10 03/15/2019 0354   LDLCALC 115 (H) 03/15/2019 0354   HgbA1c:  Lab Results  Component Value Date   HGBA1C 7.1 (H) 01/16/2019   Urine Drug Screen:     Component Value Date/Time   LABOPIA NONE DETECTED 03/14/2019 2103   COCAINSCRNUR NONE DETECTED 03/14/2019 2103   LABBENZ NONE DETECTED 03/14/2019 2103   AMPHETMU NONE DETECTED 03/14/2019 2103   THCU NONE DETECTED 03/14/2019 2103   LABBARB NONE DETECTED 03/14/2019 2103    Alcohol Level     Component Value Date/Time   ETH <10 03/14/2019 1940    IMAGING Ct Angio Head W Or Wo Contrast  Result Date: 03/14/2019 CLINICAL DATA:  Right-sided deficits EXAM: CT ANGIOGRAPHY HEAD AND NECK TECHNIQUE: Multidetector CT imaging of the head and neck was performed using the standard protocol during  bolus administration of intravenous contrast. Multiplanar CT image reconstructions and MIPs were obtained to evaluate the vascular anatomy. Carotid stenosis measurements (when applicable) are obtained utilizing NASCET criteria, using the distal internal carotid diameter as the denominator. CONTRAST:  7m OMNIPAQUE IOHEXOL 350 MG/ML SOLN COMPARISON:  Head CT 03/14/2019 FINDINGS: CTA NECK FINDINGS SKELETON: There is no bony spinal canal stenosis. No lytic or blastic lesion. OTHER NECK: Normal pharynx, larynx and major salivary glands. No cervical lymphadenopathy. Unremarkable thyroid gland. UPPER CHEST: No pneumothorax or pleural effusion. No nodules or masses. AORTIC ARCH: There is no calcific atherosclerosis of the aortic arch. There is no aneurysm, dissection or hemodynamically significant stenosis of the visualized ascending aorta and aortic arch. Conventional 3 vessel aortic branching pattern. The visualized proximal subclavian arteries are widely patent. RIGHT CAROTID SYSTEM: --Common carotid artery: Widely patent origin without common carotid artery dissection or aneurysm. --Internal carotid artery: Normal without aneurysm, dissection or stenosis. --External carotid artery: No acute abnormality. LEFT CAROTID SYSTEM: --Common carotid artery: Widely patent origin without common carotid artery dissection or aneurysm. --Internal  carotid artery: Qualitative narrowing of the proximal ICA, which otherwise remains patent to the skull base. There is low-attenuation thrombus within the proximal left ICA causing severe --External carotid artery: No acute abnormality. VERTEBRAL ARTERIES: Left dominant configuration. Both origins are normal. No dissection, occlusion or flow-limiting stenosis to the vertebrobasilar confluence. CTA HEAD FINDINGS POSTERIOR CIRCULATION: --Vertebral arteries: Normal codominant configuration of V4 segments. --Posterior inferior cerebellar arteries (PICA): Patent origins from the vertebral  arteries. --Anterior inferior cerebellar arteries (AICA): The right AICA originates normally from the basilar artery. Left AICA not clearly visualized. --Basilar artery: Normal. --Superior cerebellar arteries: Normal. --Posterior cerebral arteries (PCA): Normal. Both originate from the basilar artery. Posterior communicating arteries (p-comm) are diminutive . ANTERIOR CIRCULATION: --Intracranial internal carotid arteries: Normal. --Anterior cerebral arteries (ACA): Normal. Both A1 segments are present. Patent anterior communicating artery (a-comm). --Middle cerebral arteries (MCA): Normal. VENOUS SINUSES: As permitted by contrast timing, patent. ANATOMIC VARIANTS: None Review of the MIP images confirms the above findings. IMPRESSION: 1. 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 This causes severe qualitative narrowing of proximal ICA. This be a source for small downstream emboli. 2. No intracranial occlusion or stenosis. 3. These results were called by telephone at the time of interpretation on 03/14/2019 at 8:23 pm to Dr. Kerney Elbe , who verbally acknowledged these results. Electronically Signed   By: Ulyses Jarred M.D.   On: 03/14/2019 20:23   Ct Angio Neck W Or Wo Contrast  Result Date: 03/14/2019 CLINICAL DATA:  Right-sided deficits EXAM: CT ANGIOGRAPHY HEAD AND NECK TECHNIQUE: Multidetector CT imaging of the head and neck was performed using the standard protocol during bolus administration of intravenous contrast. Multiplanar CT image reconstructions and MIPs were obtained to evaluate the vascular anatomy. Carotid stenosis measurements (when applicable) are obtained utilizing NASCET criteria, using the distal internal carotid diameter as the denominator. CONTRAST:  79m OMNIPAQUE IOHEXOL 350 MG/ML SOLN COMPARISON:  Head CT 03/14/2019 FINDINGS: CTA NECK FINDINGS SKELETON: There is no bony spinal canal stenosis. No lytic or blastic lesion.  OTHER NECK: Normal pharynx, larynx and major salivary glands. No cervical lymphadenopathy. Unremarkable thyroid gland. UPPER CHEST: No pneumothorax or pleural effusion. No nodules or masses. AORTIC ARCH: There is no calcific atherosclerosis of the aortic arch. There is no aneurysm, dissection or hemodynamically significant stenosis of the visualized ascending aorta and aortic arch. Conventional 3 vessel aortic branching pattern. The visualized proximal subclavian arteries are widely patent. RIGHT CAROTID SYSTEM: --Common carotid artery: Widely patent origin without common carotid artery dissection or aneurysm. --Internal carotid artery: Normal without aneurysm, dissection or stenosis. --External carotid artery: No acute abnormality. LEFT CAROTID SYSTEM: --Common carotid artery: Widely patent origin without common carotid artery dissection or aneurysm. --Internal carotid artery: Qualitative narrowing of the proximal ICA, which otherwise remains patent to the skull base. There is low-attenuation thrombus within the proximal left ICA causing severe --External carotid artery: No acute abnormality. VERTEBRAL ARTERIES: Left dominant configuration. Both origins are normal. No dissection, occlusion or flow-limiting stenosis to the vertebrobasilar confluence. CTA HEAD FINDINGS POSTERIOR CIRCULATION: --Vertebral arteries: Normal codominant configuration of V4 segments. --Posterior inferior cerebellar arteries (PICA): Patent origins from the vertebral arteries. --Anterior inferior cerebellar arteries (AICA): The right AICA originates normally from the basilar artery. Left AICA not clearly visualized. --Basilar artery: Normal. --Superior cerebellar arteries: Normal. --Posterior cerebral arteries (PCA): Normal. Both originate from the basilar artery. Posterior communicating arteries (p-comm) are diminutive . ANTERIOR CIRCULATION: --Intracranial internal carotid arteries: Normal. --  Anterior cerebral arteries (ACA): Normal. Both  A1 segments are present. Patent anterior communicating artery (a-comm). --Middle cerebral arteries (MCA): Normal. VENOUS SINUSES: As permitted by contrast timing, patent. ANATOMIC VARIANTS: None Review of the MIP images confirms the above findings. IMPRESSION: 1. 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 This causes severe qualitative narrowing of proximal ICA. This be a source for small downstream emboli. 2. No intracranial occlusion or stenosis. 3. These results were called by telephone at the time of interpretation on 03/14/2019 at 8:23 pm to Dr. Kerney Elbe , who verbally acknowledged these results. Electronically Signed   By: Ulyses Jarred M.D.   On: 03/14/2019 20:23   Dg Chest Port 1 View  Result Date: 03/15/2019 CLINICAL DATA:  Check endotracheal tube placement EXAM: PORTABLE CHEST 1 VIEW COMPARISON:  03/14/2019 FINDINGS: Cardiac shadow is within normal limits. Endotracheal tube and nasogastric catheter are noted in satisfactory position. The overall inspiratory effort is poor with right basilar atelectasis. No bony abnormality is noted. IMPRESSION: Right basilar atelectasis is seen. Tubes and lines as described. Electronically Signed   By: Inez Catalina M.D.   On: 03/15/2019 08:06   Dg Chest Portable 1 View  Result Date: 03/14/2019 CLINICAL DATA:  Intubation, OG tube placement EXAM: PORTABLE CHEST 1 VIEW COMPARISON:  03/02/2019 FINDINGS: Endotracheal tube is just into the right mainstem bronchus. Recommend retracting approximately 2-3 cm. NG tube tip is in the distal esophagus. Low lung volumes with bibasilar atelectasis. No effusions or acute bony abnormality. IMPRESSION: Right mainstem intubation. Recommend retracting 2-3 cm. NG tube tip in the distal esophagus. Low lung volumes with bibasilar atelectasis. These results were called by telephone at the time of interpretation on 03/14/2019 at 9:19 pm to Dr. Noemi Chapel , who verbally  acknowledged these results. Electronically Signed   By: Rolm Baptise M.D.   On: 03/14/2019 21:22   Dg Abd Portable 1 View  Result Date: 03/14/2019 CLINICAL DATA:  OG tube EXAM: PORTABLE ABDOMEN - 1 VIEW COMPARISON:  Chest x-ray today FINDINGS: OG tube tip is in the distal esophagus, better seen on chest x-ray. Nonobstructive bowel gas pattern. Prior cholecystectomy. IMPRESSION: OG tube tip in the distal esophagus, better seen on chest x-ray. Recommend advancing. Electronically Signed   By: Rolm Baptise M.D.   On: 03/14/2019 21:22   Ct Head Code Stroke Wo Contrast  Result Date: 03/14/2019 CLINICAL DATA:  Code stroke. Slurred speech and right-sided deficits EXAM: CT HEAD WITHOUT CONTRAST TECHNIQUE: Contiguous axial images were obtained from the base of the skull through the vertex without intravenous contrast. COMPARISON:  Head CT 02/11/2019 FINDINGS: Brain: Minimal residual right convexity subdural blood, unchanged compared to 02/11/2019. No acute hemorrhage. The size and configuration of the ventricles and extra-axial CSF spaces are normal. There is hypoattenuation of the periventricular white matter, most commonly indicating chronic ischemic microangiopathy. Vascular: No abnormal hyperdensity of the major intracranial arteries or dural venous sinuses. No intracranial atherosclerosis. Skull: The visualized skull base, calvarium and extracranial soft tissues are normal. Sinuses/Orbits: No fluid levels or advanced mucosal thickening of the visualized paranasal sinuses. No mastoid or middle ear effusion. The orbits are normal. ASPECTS [facility] Stroke Program Early CT Score) - Ganglionic level infarction (caudate, lentiform nuclei, internal capsule, insula, M1-M3 cortex): 7 - Supraganglionic infarction (M4-M6 cortex): 3 Total score (0-10 with 10 being normal): 10 IMPRESSION: 1. Unchanged appearance of minimal residual right convexity subdural blood without acute hemorrhage. 2. ASPECTS is 10. 3.  These results  were communicated to Dr. Kerney Elbe at 7:50 pm on 03/14/2019 by text page via the [facility] messaging system. Electronically Signed   By: Ulyses Jarred M.D.   On: 03/14/2019 19:51   Cerebral angio S/P lt common carotid arteriogram followed by complete revascularization of symptomatic severe stenosis of Lt ICA prox sec to a large nearly occlusive flow limiting thrombus with mechanical thrombectomy with x 1 pass with a 59m x 40 mm solitaire retreiver with prox flow arrest and penumbra aspiration . LT MCA TICI 3 revascularization .   PHYSICAL EXAM  Temp:  [92.3 F (33.5 C)-98.6 F (37 C)] 98.6 F (37 C) (04/20 0800) Pulse Rate:  [53-86] 70 (04/20 1030) Resp:  [7-25] 14 (04/20 1030) BP: (91-144)/(35-93) 113/62 (04/20 1030) SpO2:  [93 %-100 %] 100 % (04/20 1030) Arterial Line BP: (75-142)/(39-64) 112/52 (04/20 1030) FiO2 (%):  [40 %-100 %] 40 % (04/20 1000) Weight:  [106.2 kg] 106.2 kg (04/19 2014)  General - Well nourished, well developed, intubated just off sedation.  Ophthalmologic - fundi not visualized due to noncooperation.  Cardiovascular - Regular rate and rhythm.  Neuro - intubated just off sedation, eyes slight open on voice, able to follow simple commands. With forced eye opening, eyes in mid position, not blinking to visual threat, doll's eyes present, limited extraocular movement, able to have slight right gaze but no left gaze on command, not tracking, PERRL. Corneal reflex present, gag and cough present. Breathing over the vent.  Facial symmetry not able to test due to ET tube.  Tongue midline in mouth. With command, pt was able to lift BUE and BLE against gravity, at least proximal 3/5 and distal 4-/5. Symmetrical bilaterally. DTR 1+ and no babinski. Sensation, coordination and gait not tested.   ASSESSMENT/PLAN Ms. DTimera Windtis a 73y.o. female with history of NASH cirrhoisis, DB, [facility]ERD, HTN[facility] sl[facility] speech, R sided weakness and  L gaze that resolved enroute. In ED, devloped dysarthria and R sided weakness.   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 paradoxial emboli  Code Stroke CT head unchanged minimal residual R convexity SDH w/o acute hmg. ASPECTS 10.     CTA head & neck L ICA nonocclusive thrombus at origin 134mlong w/ severe narrowing. No IC occlusion/stenosis. Left PCA stenosis   Cerebral angio TICI3 revascularization severe stenosis L ICA w/ solitaire and penumbra  Post IR CT no ICH or mass effect MRI  pending  2D Echo pending  LE venous doppler pending  LDL 115  HgbA1c 7.1  SCDs for VTE prophylaxis - INR 1.6  Diet - NPO  No antithrombotic prior to admission, on No antithrombotic for now given recent SDH and elevated INR with thrombocytopenia   Therapy recommendations:  pending   Disposition:  pending   Acute Respiratory Failure Potential Aspiration PNA Sarcoidosis  Intubated for mechanical thrombectomy  Trach aspirate pending   Empiric zosyn for aspiration & UTI coverage  CCM on board  Plan for extubation today  Uncompensated cirrhosis  With baseline ascites and leg edema  LE venous doppler pending  Hyperammonemia @ 55 - on lactulose  Elevated INR 1.9->1.6, aPTT 42  Thrombocytopenia 97->83  Renal failure Cre 1.70->1.72  Anemia Hb 11.0->9.5  On albumin Q6h  CCM on board  Hx of stroke / SDH  12/2018 - acute on chronic SDH, EEG neg for seizure -  improving and now only minimal residue   11/2016 admitted for right side weakness, MRI left pontine infarct, MRA neg, CUS/TTE neg. LDL 104 and A1C 8.3. Her ASA changed to plavix and added zetia  [facility] Leonie [facility]  Hypotension Hx Hypertension  Home meds:  Lasix 20, propranolol 10 bid, spironolactone 50  Changed neo to levophed . SBP goal 120-140 post IR for 24 hours . Long-term BP goal normotensive  Hyperlipidemia  Home meds:  zetia  10  LDL 115, goal < 70  No statin due to elevated LFT and cirrhosis  Continue zetia at discharge  Diabetes type II Uncontrolled w/ retinopathy, nephropathy, vasculopathy  Home meds:  Glipizide 10, lispro 15u tid, liraglutide 1.8 q hs, metformin 500 bid  HgbA1c 7.1, goal < 7.0  CBGs  SSI  Pyuria  Empiric zosyn for aspiration & UTI coverage  Blood Cx pending  U Cx pending   Other Stroke Risk Factors  Advanced age  Former Cigarette smoker  Morbid Obesity, Body mass index is 41.47 kg/m., recommend weight loss, diet and exercise as appropriate   Family hx stroke (mother)  PVD  OSA with CPAP at home  Other Active Problems  GERD  [facility] day # 1  This patient is critically ill due to stroke, left ICA near occlusion, uncompensated cirrhosis, respiratory failure, hypotension, hyperglycemia and at significant risk of neurological worsening, death form recurrent stroke, hemorrhagic conversion, cirrhosis, sepsis, DKA, seizure. This patient's care requires constant monitoring of vital signs, hemodynamics, respiratory and cardiac monitoring, review of multiple databases, neurological assessment, discussion with family, other specialists and medical decision making of high complexity. I spent 40 minutes of neurocritical care time in the care of this patient.  Rosalin Hawking, MD PhD Stroke Neurology 03/15/2019 11:04 AM   To contact Stroke Continuity provider, please refer to [email or web address] After hours, contact General Neurology

## 2019-03-15 NOTE — Progress Notes (Signed)
Pharmacy Antibiotic Note  Renatha Rosen is a 73 y.o. female admitted on 03/14/2019 with stroke, now w/ concern for aspiration pneumonia.  Pharmacy has been consulted for Zosyn dosing.  Plan: Zosyn 3.375g IV q8h (4-hour infusion).  Height: 5' 3"  (160 cm) Weight: 234 lb 2.1 oz (106.2 kg) IBW/kg (Calculated) : 52.4  No data recorded.  Recent Labs  Lab 03/14/19 1940 03/14/19 1941  WBC 8.9  --   CREATININE 1.78* 1.70*    Estimated Creatinine Clearance: 34.4 mL/min (A) (by C-G formula based on SCr of 1.7 mg/dL (H)).    Allergies  Allergen Reactions  . Celecoxib Swelling    Ankles swell  . Rofecoxib Swelling    Ankles swell  . Ciprofloxacin Itching  . Darvon [Propoxyphene] Nausea And Vomiting  . Hydrocodone-Acetaminophen Nausea And Vomiting  . Morphine Nausea And Vomiting     Thank you for allowing pharmacy to be a part of this patient's care.  Wynona Neat, PharmD, BCPS  03/15/2019 12:28 AM

## 2019-03-15 NOTE — Progress Notes (Signed)
SLP Cancellation Note  Patient Details Name: Kaysey Berndt MRN: [id number] DOB: 04-29-1946   Cancelled treatment:       Reason Eval/Treat Not Completed: Medical issues which prohibited therapy;Patient not medically ready(Pt currently on vent. SLP will follow up. )  Andrius Andrepont I. Hardin Negus, [place], [place] Office number [phone] Pager [place] 03/15/2019, 7:59 AM

## 2019-03-15 NOTE — Progress Notes (Signed)
PT Cancellation Note  Patient Details Name: Monica Pearson MRN: [id number] DOB: 07-09-1946   Cancelled Treatment:    Reason Eval/Treat Not Completed: Patient not medically ready Pt currently on vent s/p mechanical thrombectomy. Not appropriate for PT at this time. Will follow.   Marguarite Arbour A [name] 03/15/2019, 8:10 AM Wray Kearns, PT, DPT Acute Rehabilitation Services Pager [phone] Office [phone]

## 2019-03-15 NOTE — Progress Notes (Signed)
Pharmacy Heparin Induced Thrombocytopenia (HIT) Note:  Monica Pearson is an 73 y.o. female being evaluated for HIT. Heparin was started 03/14/19 during IR, and baseline platelets were 144 2wk ago.   HIT labs were ordered on 03/15/19 when platelets dropped to 97.     CALCULATE SCORE:  4Ts (see the HIT Algorithm) Score  Thrombocytopenia 1  Timing 0  Thrombosis 2  Other causes of thrombocytopenia 0  Total 3     4T score = 3 >> low probability.  Of note, heparin was used during IR procedure 03/14/19 pm but no prior heparin was found on chart or Care Everywhere. Heparin allergy added; will update when HIT Ab results.   Wynona Neat, PharmD, BCPS  03/15/2019, 1:35 AM

## 2019-03-15 NOTE — Progress Notes (Signed)
Bilateral lower extremity venous duplex completed.  03/15/2019 11:11 AM Maudry Mayhew, MHA, RVT, RDCS, RDMS

## 2019-03-15 NOTE — Progress Notes (Signed)
PROGRESS NOTE    Monica Pearson  ZOX:[id number] DOB: 1946-01-11 DOA: 03/14/2019 PCP: Jenel Lucks, PA-C   Brief Narrative:  Per HPI: This 73 y.o. female reformed smoker presented to the [place] emergency department via [facility] with complaints of slurred speech, right-sided weakness and leftward gaze preference.  Her symptoms reportedly resolved in route to the emergency department; however, in the emergency department, she developed recurring symptoms along with right-sided facial droop.  Code STROKE was called.  CT of the head was negative; however, CTA of the neck revealed left internal carotid artery occlusion.  The patient is seen in 4 and 23 after undergoing mechanical thrombectomy.  She has returned from the interventional radiology suite on mechanical ventilatory support.  We are consulted for assistance with ventilator management.   Assessment & Plan:   Principal Problem:   Stroke (cerebrum) (HCC) Active Problems:   GERD (gastroesophageal reflux disease)   Stenosis of left carotid artery   Endotracheally intubated   Sarcoidosis of lung (HCC)   Bacteriuria with pyuria   Bradycardia   CKD stage 3 due to type 2 diabetes mellitus ([place])   Type 2 diabetes mellitus with complication, with long-term current use of insulin (HCC)   Microcytic anemia   Thrombocytopenia ([place])   1. 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  Patient has prior history of subdural hematoma.  Further plans per neurology. 2. Potential aspiration pneumonia and UTI.  Follow cultures and maintain on vancomycin and Zosyn empirically for now. 3. AKI on CKD stage II.  Baseline creatinine appears to be 0.5-0.8 looking back almost 1 year.  Will avoid nephrotoxic agents and monitor urine output.  Patient is nonoliguric.  Avoid diuretics at this time given low blood pressure readings.  Will start IV normal saline in addition to albumin as noted below. 4. Microcytic anemia with  thrombocytopenia.  Iron studies and HIT panel pending.  No overt bleeding identified.  Continue to monitor repeat CBC. 5. Nash cirrhosis with ascites.  She does not currently have tense ascites.  She is noted to be hypoalbuminemic in the setting of hypotension.  Will administer 4 doses of albumin and maintain on normal saline, time-limited and hope to wean pressors today.  Continue to monitor PT/INR as well as ammonia levels. 6. History of sarcoidosis-status post intubation.  Monitor chest x-ray that is pending this a.m.  Further management per PCCM with hopeful mechanical ventilation weaning soon. 7. Type 2 diabetes with complications along with morbid obesity.  Continue SSI.  Blood glucose currently stable. 8. GERD.  PPI.   DVT prophylaxis:SCDs Code Status: Full Family Communication: None at bedside Disposition Plan: Continue on vancomycin and Zosyn empirically until further cultures return.  Ordered albumin infusions as well as normal saline to wean pressors as tolerated.  Continue close monitoring in ICU.  Further plans per neurology.  Chest x-ray pending this a.m.   Consultants:   PCCM  Neurology  Procedures:   None  Antimicrobials:   Vancomycin 4/19->  Zosyn 4/19->   Subjective: Patient seen and evaluated today with no new acute complaints or concerns.  She is arousable to tactile and voice stimulus.  She continues to remain on Bair hugger on account of hypothermia and is also on Neo-Synephrine.  No other acute events noted overnight.  Objective: Vitals:   03/15/19 0545 03/15/19 0600 03/15/19 0615 03/15/19 0630  BP: (!) 120/54 (!) 108/45 (!) 138/48 (!) 111/42  Pulse: 76 66 63 65  Resp: 18 18  16 17  Temp:      TempSrc:      SpO2: 100% 100% 100% 100%  Weight:      Height:        Intake/Output Summary (Last 24 hours) at 03/15/2019 0648 Last data filed at 03/15/2019 0600 Gross per 24 hour  Intake 1354.12 ml  Output 1800 ml  Net -445.88 ml   Filed Weights    03/14/19 1900 03/14/19 2014  Weight: 106.2 kg 106.2 kg    Examination:  General exam: Appears calm and comfortable on ventilator, arousable Respiratory system: Clear to auscultation. Respiratory effort normal.  Intubated on FiO2 40%. Cardiovascular system: S1 & S2 heard, RRR. No JVD, murmurs, rubs, gallops or clicks. No pedal edema. Gastrointestinal system: Abdomen is distended moderately, but not tense, soft and nontender. No organomegaly or masses felt. Normal bowel sounds heard. Central nervous system: Arousable to voice and touch, but otherwise sedated. Extremities: Lower extremities with SCDs Skin: No rashes, lesions or ulcers Psychiatry: Cannot be evaluated    Data Reviewed: I have personally reviewed following labs and imaging studies  CBC: Recent Labs  Lab 03/14/19 1940 03/15/19 0354  WBC 8.9 7.4  NEUTROABS 7.2 5.3  HGB 11.0* 9.5*  HCT 30.0* Unable to determine due to a cold agglutinin  MCV 69.6* Unable to determine due to a cold agglutinin  PLT 97* 83*   Basic Metabolic Panel: Recent Labs  Lab 03/14/19 1940 03/14/19 1941 03/15/19 0354  NA 133*  --  134*  K 4.7  --  3.5  CL 98  --  103  CO2 26  --  22  GLUCOSE 172*  --  106*  BUN 81*  --  76*  CREATININE 1.78* 1.70* 1.72*  CALCIUM 8.3*  --  8.1*   GFR: Estimated Creatinine Clearance: 34 mL/min (A) (by C-G formula based on SCr of 1.72 mg/dL (H)). Liver Function Tests: Recent Labs  Lab 03/14/19 1940  AST 104*  ALT 102*  ALKPHOS 225*  BILITOT 2.6*  PROT 5.8*  ALBUMIN 2.1*   No results for input(s): LIPASE, AMYLASE in the last 168 hours. Recent Labs  Lab 03/14/19 1941  AMMONIA 55*   Coagulation Profile: Recent Labs  Lab 03/14/19 1940  INR 1.6*   Cardiac Enzymes: No results for input(s): CKTOTAL, CKMB, CKMBINDEX, TROPONINI in the last 168 hours. BNP (last 3 results) No results for input(s): PROBNP in the last 8760 hours. HbA1C: No results for input(s): HGBA1C in the last 72  hours. CBG: Recent Labs  Lab 03/14/19 2333 03/15/19 0353  GLUCAP 145* 112*   Lipid Profile: Recent Labs    03/15/19 0354  CHOL 165  HDL 40*  LDLCALC 115*  TRIG 51   51  CHOLHDL 4.1   Thyroid Function Tests: No results for input(s): TSH, T4TOTAL, FREET4, T3FREE, THYROIDAB in the last 72 hours. Anemia Panel: No results for input(s): VITAMINB12, FOLATE, FERRITIN, TIBC, IRON, RETICCTPCT in the last 72 hours. Sepsis Labs: No results for input(s): PROCALCITON, LATICACIDVEN in the last 168 hours.  Recent Results (from the past 240 hour(s))  MRSA PCR Screening     Status: None   Collection Time: 03/15/19  3:54 AM  Result Value Ref Range Status   MRSA by PCR NEGATIVE NEGATIVE Final    Comment:        The GeneXpert MRSA Assay (FDA approved for NASAL specimens only), is one component of a comprehensive MRSA colonization surveillance program. It is not intended to diagnose MRSA infection nor to  guide or monitor treatment for MRSA infections. Performed at [facility], [place] [address] [place], [place] [address]          Radiology Studies: Ct Angio Head W Or Wo Contrast  Result Date: 03/14/2019 CLINICAL DATA:  Right-sided deficits EXAM: CT ANGIOGRAPHY HEAD AND NECK TECHNIQUE: Multidetector CT imaging of the head and neck was performed using the standard protocol during bolus administration of intravenous contrast. Multiplanar CT image reconstructions and MIPs were obtained to evaluate the vascular anatomy. Carotid stenosis measurements (when applicable) are obtained utilizing NASCET criteria, using the distal internal carotid diameter as the denominator. CONTRAST:  16m OMNIPAQUE IOHEXOL 350 MG/ML SOLN COMPARISON:  Head CT 03/14/2019 FINDINGS: CTA NECK FINDINGS SKELETON: There is no bony spinal canal stenosis. No lytic or blastic lesion. OTHER NECK: Normal pharynx, larynx and major salivary glands. No cervical lymphadenopathy. Unremarkable thyroid gland. UPPER CHEST: No  pneumothorax or pleural effusion. No nodules or masses. AORTIC ARCH: There is no calcific atherosclerosis of the aortic arch. There is no aneurysm, dissection or hemodynamically significant stenosis of the visualized ascending aorta and aortic arch. Conventional 3 vessel aortic branching pattern. The visualized proximal subclavian arteries are widely patent. RIGHT CAROTID SYSTEM: --Common carotid artery: Widely patent origin without common carotid artery dissection or aneurysm. --Internal carotid artery: Normal without aneurysm, dissection or stenosis. --External carotid artery: No acute abnormality. LEFT CAROTID SYSTEM: --Common carotid artery: Widely patent origin without common carotid artery dissection or aneurysm. --Internal carotid artery: Qualitative narrowing of the proximal ICA, which otherwise remains patent to the skull base. There is low-attenuation thrombus within the proximal left ICA causing severe --External carotid artery: No acute abnormality. VERTEBRAL ARTERIES: Left dominant configuration. Both origins are normal. No dissection, occlusion or flow-limiting stenosis to the vertebrobasilar confluence. CTA HEAD FINDINGS POSTERIOR CIRCULATION: --Vertebral arteries: Normal codominant configuration of V4 segments. --Posterior inferior cerebellar arteries (PICA): Patent origins from the vertebral arteries. --Anterior inferior cerebellar arteries (AICA): The right AICA originates normally from the basilar artery. Left AICA not clearly visualized. --Basilar artery: Normal. --Superior cerebellar arteries: Normal. --Posterior cerebral arteries (PCA): Normal. Both originate from the basilar artery. Posterior communicating arteries (p-comm) are diminutive . ANTERIOR CIRCULATION: --Intracranial internal carotid arteries: Normal. --Anterior cerebral arteries (ACA): Normal. Both A1 segments are present. Patent anterior communicating artery (a-comm). --Middle cerebral arteries (MCA): Normal. VENOUS SINUSES: As  permitted by contrast timing, patent. ANATOMIC VARIANTS: None Review of the MIP images confirms the above findings. IMPRESSION: 1. 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 This causes severe qualitative narrowing of proximal ICA. This be a source for small downstream emboli. 2. No intracranial occlusion or stenosis. 3. These results were called by telephone at the time of interpretation on 03/14/2019 at 8:23 pm to Dr. EKerney Elbe, who verbally acknowledged these results. Electronically Signed   By: KUlyses JarredM.D.   On: 03/14/2019 20:23   Ct Angio Neck W Or Wo Contrast  Result Date: 03/14/2019 CLINICAL DATA:  Right-sided deficits EXAM: CT ANGIOGRAPHY HEAD AND NECK TECHNIQUE: Multidetector CT imaging of the head and neck was performed using the standard protocol during bolus administration of intravenous contrast. Multiplanar CT image reconstructions and MIPs were obtained to evaluate the vascular anatomy. Carotid stenosis measurements (when applicable) are obtained utilizing NASCET criteria, using the distal internal carotid diameter as the denominator. CONTRAST:  748mOMNIPAQUE IOHEXOL 350 MG/ML SOLN COMPARISON:  Head CT 03/14/2019 FINDINGS: CTA NECK FINDINGS SKELETON: There is no  bony spinal canal stenosis. No lytic or blastic lesion. OTHER NECK: Normal pharynx, larynx and major salivary glands. No cervical lymphadenopathy. Unremarkable thyroid gland. UPPER CHEST: No pneumothorax or pleural effusion. No nodules or masses. AORTIC ARCH: There is no calcific atherosclerosis of the aortic arch. There is no aneurysm, dissection or hemodynamically significant stenosis of the visualized ascending aorta and aortic arch. Conventional 3 vessel aortic branching pattern. The visualized proximal subclavian arteries are widely patent. RIGHT CAROTID SYSTEM: --Common carotid artery: Widely patent origin without common carotid artery dissection or aneurysm.  --Internal carotid artery: Normal without aneurysm, dissection or stenosis. --External carotid artery: No acute abnormality. LEFT CAROTID SYSTEM: --Common carotid artery: Widely patent origin without common carotid artery dissection or aneurysm. --Internal carotid artery: Qualitative narrowing of the proximal ICA, which otherwise remains patent to the skull base. There is low-attenuation thrombus within the proximal left ICA causing severe --External carotid artery: No acute abnormality. VERTEBRAL ARTERIES: Left dominant configuration. Both origins are normal. No dissection, occlusion or flow-limiting stenosis to the vertebrobasilar confluence. CTA HEAD FINDINGS POSTERIOR CIRCULATION: --Vertebral arteries: Normal codominant configuration of V4 segments. --Posterior inferior cerebellar arteries (PICA): Patent origins from the vertebral arteries. --Anterior inferior cerebellar arteries (AICA): The right AICA originates normally from the basilar artery. Left AICA not clearly visualized. --Basilar artery: Normal. --Superior cerebellar arteries: Normal. --Posterior cerebral arteries (PCA): Normal. Both originate from the basilar artery. Posterior communicating arteries (p-comm) are diminutive . ANTERIOR CIRCULATION: --Intracranial internal carotid arteries: Normal. --Anterior cerebral arteries (ACA): Normal. Both A1 segments are present. Patent anterior communicating artery (a-comm). --Middle cerebral arteries (MCA): Normal. VENOUS SINUSES: As permitted by contrast timing, patent. ANATOMIC VARIANTS: None Review of the MIP images confirms the above findings. IMPRESSION: 1. 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 This causes severe qualitative narrowing of proximal ICA. This be a source for small downstream emboli. 2. No intracranial occlusion or stenosis. 3. These results were called by telephone at the time of interpretation on 03/14/2019 at 8:23 pm to Dr.  Kerney Elbe , who verbally acknowledged these results. Electronically Signed   By: Ulyses Jarred M.D.   On: 03/14/2019 20:23   Dg Chest Portable 1 View  Result Date: 03/14/2019 CLINICAL DATA:  Intubation, OG tube placement EXAM: PORTABLE CHEST 1 VIEW COMPARISON:  03/02/2019 FINDINGS: Endotracheal tube is just into the right mainstem bronchus. Recommend retracting approximately 2-3 cm. NG tube tip is in the distal esophagus. Low lung volumes with bibasilar atelectasis. No effusions or acute bony abnormality. IMPRESSION: Right mainstem intubation. Recommend retracting 2-3 cm. NG tube tip in the distal esophagus. Low lung volumes with bibasilar atelectasis. These results were called by telephone at the time of interpretation on 03/14/2019 at 9:19 pm to Dr. Noemi Chapel , who verbally acknowledged these results. Electronically Signed   By: Rolm Baptise M.D.   On: 03/14/2019 21:22   Dg Abd Portable 1 View  Result Date: 03/14/2019 CLINICAL DATA:  OG tube EXAM: PORTABLE ABDOMEN - 1 VIEW COMPARISON:  Chest x-ray today FINDINGS: OG tube tip is in the distal esophagus, better seen on chest x-ray. Nonobstructive bowel gas pattern. Prior cholecystectomy. IMPRESSION: OG tube tip in the distal esophagus, better seen on chest x-ray. Recommend advancing. Electronically Signed   By: Rolm Baptise M.D.   On: 03/14/2019 21:22   Ct Head Code Stroke Wo Contrast  Result Date: 03/14/2019 CLINICAL DATA:  Code stroke. Slurred speech and right-sided deficits EXAM: CT HEAD WITHOUT CONTRAST  TECHNIQUE: Contiguous axial images were obtained from the base of the skull through the vertex without intravenous contrast. COMPARISON:  Head CT 02/11/2019 FINDINGS: Brain: Minimal residual right convexity subdural blood, unchanged compared to 02/11/2019. No acute hemorrhage. The size and configuration of the ventricles and extra-axial CSF spaces are normal. There is hypoattenuation of the periventricular white matter, most commonly indicating  chronic ischemic microangiopathy. Vascular: No abnormal hyperdensity of the major intracranial arteries or dural venous sinuses. No intracranial atherosclerosis. Skull: The visualized skull base, calvarium and extracranial soft tissues are normal. Sinuses/Orbits: No fluid levels or advanced mucosal thickening of the visualized paranasal sinuses. No mastoid or middle ear effusion. The orbits are normal. ASPECTS [facility] Stroke Program Early CT Score) - Ganglionic level infarction (caudate, lentiform nuclei, internal capsule, insula, M1-M3 cortex): 7 - Supraganglionic infarction (M4-M6 cortex): 3 Total score (0-10 with 10 being normal): 10 IMPRESSION: 1. Unchanged appearance of minimal residual right convexity subdural blood without acute hemorrhage. 2. ASPECTS is 10. 3. These results were communicated to Dr. Kerney Elbe at 7:50 pm on 03/14/2019 by text page via the [facility] messaging system. Electronically Signed   By: Ulyses Jarred M.D.   On: 03/14/2019 19:51        Scheduled Meds:  chlorhexidine gluconate (MEDLINE KIT)  15 mL Mouth Rinse BID   eptifibatide       insulin aspart  0-15 Units Subcutaneous Q4H   lactulose  10 g Oral BID   magnesium oxide  400 mg Oral QHS   mouth rinse  15 mL Mouth Rinse 10 times per day   nitroGLYCERIN       pantoprazole (PROTONIX) IV  40 mg Intravenous Q24H   sodium chloride flush  3 mL Intravenous Q12H   Continuous Infusions:  sodium chloride Stopped (03/15/19 0509)   sodium chloride     sodium chloride     sodium chloride     albumin human     clevidipine Stopped (03/15/19 0109)   phenylephrine (NEO-SYNEPHRINE) Adult infusion 125 mcg/min (03/15/19 0619)   piperacillin-tazobactam (ZOSYN)  IV 12.5 mL/hr at 03/15/19 0600   propofol (DIPRIVAN) infusion Stopped (03/15/19 0549)   vancomycin       LOS: 1 day    Time spent: 30 minutes    Rashon Rezek Darleen Crocker, DO [facility] Pager [phone]  If 7PM-7AM, please contact  night-coverage [email or web address] Password [email or web address] 03/15/2019, 6:48 AM

## 2019-03-15 NOTE — Consult Note (Signed)
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  Per chart review, patient and nursing, patient lives alone. Independent with a rolling walker prior to admission. She still drives. One level home. She has a son in the area of the checks on her regularly. Presented 03/14/2019 with right-sided weakness and slurred speech. Cranial CT scan reviewed by neurology services suspect left MCA infarction.. Minimal right convexity subdural blood without acute hemorrhage. Patient did not receive TPA. Echocardiogram with ejection fraction of 65%, hyperdynamic systolic function.Lower extremity Dopplers negative for DVT. 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 Patient underwent left common carotid arteriogram followed by complete revascularization of symptomatic severe stenosis left ICA per interventional radiology 03/14/2019. Patient was on pressors for low blood pressure. Follow-up MRI reviewed, showing small left MCA infarct. Patient is currently NPO. Patient was extubated 03/15/2019 and therapy evaluation completed.M.D. has requested physical medicine rehabilitation consult.   Review of Systems  Constitutional: Negative for chills and fever.  HENT: Negative for hearing loss.   Eyes: Negative for blurred vision and double vision.  Respiratory: Negative for shortness of breath.   Cardiovascular: Positive for leg swelling. Negative for palpitations.  Gastrointestinal: Positive for constipation. Negative for heartburn, nausea and vomiting.       GERD  Genitourinary: Negative for dysuria, flank pain and hematuria.  Musculoskeletal: Positive for myalgias.    Skin: Negative for rash.  Neurological: Positive for dizziness, speech change and focal weakness.  All other systems reviewed and are negative.  Past Medical History:  Diagnosis Date   Cirrhosis ([place])    Diabetes mellitus without complication ([place])    Diabetic retinopathy ([place])    Diabetic retinopathy ([place])    GERD (gastroesophageal reflux disease)    High cholesterol    Hypertension    Patient is Jehovah's Witness    Sarcoidosis    Stroke ([place])    Subdural hematoma ([place])    Past Surgical History:  Procedure Laterality Date   CHOLECYSTECTOMY     KNEE SURGERY     RADIOLOGY WITH ANESTHESIA N/A 03/14/2019   Procedure: RADIOLOGY WITH ANESTHESIA;  Surgeon: Luanne Bras, MD;  Location: [place];  Service: Radiology;  Laterality: N/A;   Family History  Problem Relation Age of Onset   Stroke Mother    Bladder Cancer Mother    CAD Father    Prostate cancer Father    Social History:  reports that she has quit smoking. She has never used smokeless tobacco. She reports that she does not drink alcohol or use drugs. Allergies:  Allergies  Allergen Reactions   Celecoxib Swelling    Ankles swell   Rofecoxib Swelling    Ankles swell   Heparin     Low risk HIT; Heparin Antibody = PENDING    Ciprofloxacin Itching   Darvon [Propoxyphene] Nausea And Vomiting   Hydrocodone-Acetaminophen Nausea And Vomiting   Morphine Nausea And Vomiting   Medications Prior to Admission  Medication Sig Dispense Refill   calcium carbonate (OS-CAL) 600 MG TABS tablet Take 600 mg by mouth 2 (two) times a day.     chlorthalidone (HYGROTON) 25 MG tablet Take 25 mg by mouth daily.      co-enzyme Q-10  30 MG capsule Take 30 mg by mouth 2 (two) times daily.     furosemide (LASIX) 40 MG tablet Take 40 mg by mouth 2 (two) times daily.      glipiZIDE (GLUCOTROL XL) 10 MG 24 hr tablet Take 10 mg by mouth 2 (two) times a day.      insulin lispro (HUMALOG KWIKPEN) 100 UNIT/ML  KwikPen Inject 0-15 Units into the skin See admin instructions. MAX OF 50 UNITS/DAY  Based on sliding scale     lactulose (CHRONULAC) 10 GM/15ML solution Take 15 mLs (10 g total) by mouth 2 (two) times daily. (Patient taking differently: Take 10 g by mouth 2 (two) times daily as needed for mild constipation. ) 236 mL 0   liraglutide (VICTOZA) 18 MG/3ML SOPN Inject 1.8 mg into the skin at bedtime.     metFORMIN (GLUCOPHAGE) 500 MG tablet Take 500 mg by mouth 2 (two) times daily with a meal.      propranolol (INDERAL) 10 MG tablet Take 1 tablet (10 mg total) by mouth 2 (two) times daily. 60 tablet 0   spironolactone (ALDACTONE) 100 MG tablet Take 50 mg by mouth 2 (two) times a day.     spironolactone (ALDACTONE) 50 MG tablet Take 1 tablet (50 mg total) by mouth daily for 30 days. (Patient not taking: Reported on 03/15/2019) 30 tablet 0    Home: [place] expects to be discharged to:: Private residence Living Arrangements: Alone Available Help at Discharge: Family, Available PRN/intermittently Type of Home: House Home Access: Stairs to enter [employer] of Steps: 1 in front vs 5 in back Entrance Stairs-Rails: Right Home Layout: One level Bathroom Shower/Tub: Multimedia programmer: Standard Home Equipment: Environmental consultant - 2 wheels, Shower seat  Functional History: Prior Function Level of Independence: Independent with assistive device(s) Comments: Uses RW for ambulation; drives. [place] at home Functional Status:  Mobility: Bed Mobility Overal bed mobility: Needs Assistance Bed Mobility: Rolling, Sidelying to Sit, Sit to Sidelying Rolling: Max assist, +2 for physical assistance Sidelying to sit: Max assist, +2 for physical assistance Sit to sidelying: Mod assist General bed mobility comments: ASsist with LEs, bottom and to elevate trunk to get to EOB. Needed assist to bring LEs to return to supine. Transfers Overall transfer level: Needs  assistance Equipment used: 2 person hand held assist Transfers: Sit to/from Stand Sit to Stand: Mod assist, +2 physical assistance General transfer comment: Assist to power to standing with cues for forward weight shift; RLE initially locked out into genu recurvatum.  Ambulation/Gait Ambulation/Gait assistance: Mod assist, +2 physical assistance Gait Distance (Feet): 5 Feet Assistive device: 2 person hand held assist Gait Pattern/deviations: Step-to pattern, Wide base of support, Decreased weight shift to right, Decreased weight shift to left General Gait Details: Able to take side steps along side bed with Mod A of 2, assist for weight shifting, advancing RLE and for balance. Stabilizing right knee due to instability.  Gait velocity: decreased    ADL: ADL Overall ADL's : Needs assistance/impaired Eating/Feeding: NPO Grooming: Wash/dry hands, Wash/dry face, Oral care, Brushing hair, Moderate assistance, Sitting Upper Body Bathing: Maximal assistance, Sitting Lower Body Bathing: Maximal assistance, Sit to/from stand Upper Body Dressing : Moderate assistance, Sitting Lower Body Dressing: Maximal assistance, Sit to/from stand Toilet Transfer: Moderate assistance, +2 for physical assistance, +2 for safety/equipment, BSC Toileting- Clothing Manipulation and Hygiene: Total assistance, Sit to/from stand Functional mobility during ADLs: Moderate assistance, +2 for physical assistance, +2 for safety/equipment General ADL Comments: Pt  limited by weakness, lethargy and decreased attention   Cognition: Cognition Overall Cognitive Status: Impaired/Different from baseline Orientation Level: Oriented X4 Cognition Arousal/Alertness: Lethargic Behavior During Therapy: WFL for tasks assessed/performed Overall Cognitive Status: Impaired/Different from baseline Area of Impairment: Orientation, Attention, Following commands, Problem solving, Safety/judgement Orientation Level: Disoriented to,  Situation Current Attention Level: Focused Following Commands: Follows one step commands with increased time(multimodal cues ) Safety/Judgement: Decreased awareness of safety, Decreased awareness of deficits Problem Solving: Slow processing, Decreased initiation, Difficulty sequencing, Requires verbal cues, Requires tactile cues General Comments: Lethargic during session needing repetition to answer questions. Knows it is April but needs options to state the year. When asked why she was in the hospital, "the hashbrowns" and then laughs. not able to state situation. I  Blood pressure (!) 92/53, pulse 70, temperature 97.6 F (36.4 C), temperature source Oral, resp. rate 13, height 5' 3"  (1.6 m), weight 106.2 kg, SpO2 95 %. Physical Exam  Vitals reviewed. Constitutional: She appears well-developed.  Morbidly obese  HENT:  Head: Normocephalic and atraumatic.  Poor dentition  Eyes: EOM are normal. Right eye exhibits no discharge. Left eye exhibits no discharge.  Respiratory: Effort normal.  GI: She exhibits no distension.  Musculoskeletal:     Comments: No edema or tenderness in extremities  Neurological: She is alert.  She makes eye contact with examiner.  Followed simple commands.  Provides her name and age. Motor: LUE/LLE: 5/5 proximal distal RUE/RLE: 4+/5 proximal to distal  Skin: Skin is warm and dry.  Psychiatric: Her speech is delayed. She is slowed.    Results for orders placed or performed during the hospital encounter of 03/14/19 (from the past 24 hour(s))  Blood gas, arterial     Status: Abnormal   Collection Time: 03/15/19  6:00 AM  Result Value Ref Range   FIO2 40.00    Delivery systems VENTILATOR    Mode PRESSURE REGULATED VOLUME CONTROL    VT 420 mL   LHR 15 resp/min   Peep/cpap 5.0 cm H20   pH, Arterial 7.443 7.350 - 7.450   pCO2 arterial 32.3 32.0 - 48.0 mmHg   pO2, Arterial 163 (H) 83.0 - 108.0 mmHg   Bicarbonate 21.8 20.0 - 28.0 mmol/L   Acid-base deficit  1.8 0.0 - 2.0 mmol/L   O2 Saturation 99.2 %   Patient temperature 98.6    Collection site A-LINE    Drawn by [id number]    Sample type ARTERIAL DRAW   Glucose, capillary     Status: Abnormal   Collection Time: 03/15/19  8:05 AM  Result Value Ref Range   Glucose-Capillary 68 (L) 70 - 99 mg/dL   Comment 1 Notify RN    Comment 2 Document in Chart   Glucose, capillary     Status: Abnormal   Collection Time: 03/15/19  9:10 AM  Result Value Ref Range   Glucose-Capillary 113 (H) 70 - 99 mg/dL  Iron and TIBC     Status: Abnormal   Collection Time: 03/15/19 11:00 AM  Result Value Ref Range   Iron 96 28 - 170 ug/dL   TIBC 200 (L) 250 - 450 ug/dL   Saturation Ratios 48 (H) 10.4 - 31.8 %   UIBC 104 ug/dL  Ferritin     Status: None   Collection Time: 03/15/19 11:00 AM  Result Value Ref Range   Ferritin 236 11 - 307 ng/mL  Culture, respiratory     Status: None (Preliminary result)   Collection Time: 03/15/19 11:18 AM  Result Value Ref Range   Specimen Description TRACHEAL ASPIRATE    Special Requests Normal    Gram Stain      ABUNDANT WBC PRESENT,BOTH PMN AND MONONUCLEAR NO ORGANISMS SEEN Performed at [facility], [place] [address] [place], [place] [address]    Culture PENDING    Report Status PENDING   Glucose, capillary     Status: Abnormal   Collection Time: 03/15/19 12:04 PM  Result Value Ref Range   Glucose-Capillary 53 (L) 70 - 99 mg/dL  Glucose, capillary     Status: Abnormal   Collection Time: 03/15/19 12:06 PM  Result Value Ref Range   Glucose-Capillary 45 (L) 70 - 99 mg/dL  Glucose, capillary     Status: None   Collection Time: 03/15/19 12:54 PM  Result Value Ref Range   Glucose-Capillary 73 70 - 99 mg/dL  Glucose, capillary     Status: None   Collection Time: 03/15/19  5:00 PM  Result Value Ref Range   Glucose-Capillary 86 70 - 99 mg/dL  Glucose, capillary     Status: None   Collection Time: 03/15/19  7:53 PM  Result Value Ref Range   Glucose-Capillary 77 70 -  99 mg/dL  Glucose, capillary     Status: None   Collection Time: 03/15/19 11:38 PM  Result Value Ref Range   Glucose-Capillary 81 70 - 99 mg/dL  Glucose, capillary     Status: None   Collection Time: 03/16/19  3:31 AM  Result Value Ref Range   Glucose-Capillary 91 70 - 99 mg/dL  Protime-INR     Status: Abnormal   Collection Time: 03/16/19  4:26 AM  Result Value Ref Range   Prothrombin Time 21.2 (H) 11.4 - 15.2 seconds   INR 1.9 (H) 0.8 - 1.2   Ct Angio Head W Or Wo Contrast  Result Date: 03/14/2019 CLINICAL DATA:  Right-sided deficits EXAM: CT ANGIOGRAPHY HEAD AND NECK TECHNIQUE: Multidetector CT imaging of the head and neck was performed using the standard protocol during bolus administration of intravenous contrast. Multiplanar CT image reconstructions and MIPs were obtained to evaluate the vascular anatomy. Carotid stenosis measurements (when applicable) are obtained utilizing NASCET criteria, using the distal internal carotid diameter as the denominator. CONTRAST:  96m OMNIPAQUE IOHEXOL 350 MG/ML SOLN COMPARISON:  Head CT 03/14/2019 FINDINGS: CTA NECK FINDINGS SKELETON: There is no bony spinal canal stenosis. No lytic or blastic lesion. OTHER NECK: Normal pharynx, larynx and major salivary glands. No cervical lymphadenopathy. Unremarkable thyroid gland. UPPER CHEST: No pneumothorax or pleural effusion. No nodules or masses. AORTIC ARCH: There is no calcific atherosclerosis of the aortic arch. There is no aneurysm, dissection or hemodynamically significant stenosis of the visualized ascending aorta and aortic arch. Conventional 3 vessel aortic branching pattern. The visualized proximal subclavian arteries are widely patent. RIGHT CAROTID SYSTEM: --Common carotid artery: Widely patent origin without common carotid artery dissection or aneurysm. --Internal carotid artery: Normal without aneurysm, dissection or stenosis. --External carotid artery: No acute abnormality. LEFT CAROTID SYSTEM: --Common  carotid artery: Widely patent origin without common carotid artery dissection or aneurysm. --Internal carotid artery: Qualitative narrowing of the proximal ICA, which otherwise remains patent to the skull base. There is low-attenuation thrombus within the proximal left ICA causing severe --External carotid artery: No acute abnormality. VERTEBRAL ARTERIES: Left dominant configuration. Both origins are normal. No dissection, occlusion or flow-limiting stenosis to the vertebrobasilar confluence. CTA HEAD FINDINGS POSTERIOR CIRCULATION: --Vertebral arteries: Normal codominant configuration of V4 segments. --Posterior inferior cerebellar  arteries (PICA): Patent origins from the vertebral arteries. --Anterior inferior cerebellar arteries (AICA): The right AICA originates normally from the basilar artery. Left AICA not clearly visualized. --Basilar artery: Normal. --Superior cerebellar arteries: Normal. --Posterior cerebral arteries (PCA): Normal. Both originate from the basilar artery. Posterior communicating arteries (p-comm) are diminutive . ANTERIOR CIRCULATION: --Intracranial internal carotid arteries: Normal. --Anterior cerebral arteries (ACA): Normal. Both A1 segments are present. Patent anterior communicating artery (a-comm). --Middle cerebral arteries (MCA): Normal. VENOUS SINUSES: As permitted by contrast timing, patent. ANATOMIC VARIANTS: None Review of the MIP images confirms the above findings. IMPRESSION: 1. 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 This causes severe qualitative narrowing of proximal ICA. This be a source for small downstream emboli. 2. No intracranial occlusion or stenosis. 3. These results were called by telephone at the time of interpretation on 03/14/2019 at 8:23 pm to Dr. Kerney Elbe , who verbally acknowledged these results. Electronically Signed   By: Ulyses Jarred M.D.   On: 03/14/2019 20:23   Ct Angio Neck W Or Wo  Contrast  Result Date: 03/14/2019 CLINICAL DATA:  Right-sided deficits EXAM: CT ANGIOGRAPHY HEAD AND NECK TECHNIQUE: Multidetector CT imaging of the head and neck was performed using the standard protocol during bolus administration of intravenous contrast. Multiplanar CT image reconstructions and MIPs were obtained to evaluate the vascular anatomy. Carotid stenosis measurements (when applicable) are obtained utilizing NASCET criteria, using the distal internal carotid diameter as the denominator. CONTRAST:  20m OMNIPAQUE IOHEXOL 350 MG/ML SOLN COMPARISON:  Head CT 03/14/2019 FINDINGS: CTA NECK FINDINGS SKELETON: There is no bony spinal canal stenosis. No lytic or blastic lesion. OTHER NECK: Normal pharynx, larynx and major salivary glands. No cervical lymphadenopathy. Unremarkable thyroid gland. UPPER CHEST: No pneumothorax or pleural effusion. No nodules or masses. AORTIC ARCH: There is no calcific atherosclerosis of the aortic arch. There is no aneurysm, dissection or hemodynamically significant stenosis of the visualized ascending aorta and aortic arch. Conventional 3 vessel aortic branching pattern. The visualized proximal subclavian arteries are widely patent. RIGHT CAROTID SYSTEM: --Common carotid artery: Widely patent origin without common carotid artery dissection or aneurysm. --Internal carotid artery: Normal without aneurysm, dissection or stenosis. --External carotid artery: No acute abnormality. LEFT CAROTID SYSTEM: --Common carotid artery: Widely patent origin without common carotid artery dissection or aneurysm. --Internal carotid artery: Qualitative narrowing of the proximal ICA, which otherwise remains patent to the skull base. There is low-attenuation thrombus within the proximal left ICA causing severe --External carotid artery: No acute abnormality. VERTEBRAL ARTERIES: Left dominant configuration. Both origins are normal. No dissection, occlusion or flow-limiting stenosis to the  vertebrobasilar confluence. CTA HEAD FINDINGS POSTERIOR CIRCULATION: --Vertebral arteries: Normal codominant configuration of V4 segments. --Posterior inferior cerebellar arteries (PICA): Patent origins from the vertebral arteries. --Anterior inferior cerebellar arteries (AICA): The right AICA originates normally from the basilar artery. Left AICA not clearly visualized. --Basilar artery: Normal. --Superior cerebellar arteries: Normal. --Posterior cerebral arteries (PCA): Normal. Both originate from the basilar artery. Posterior communicating arteries (p-comm) are diminutive . ANTERIOR CIRCULATION: --Intracranial internal carotid arteries: Normal. --Anterior cerebral arteries (ACA): Normal. Both A1 segments are present. Patent anterior communicating artery (a-comm). --Middle cerebral arteries (MCA): Normal. VENOUS SINUSES: As permitted by contrast timing, patent. ANATOMIC VARIANTS: None Review of the MIP images confirms the above findings. IMPRESSION: 1. 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 This causes severe qualitative narrowing of proximal ICA. This be  a source for small downstream emboli. 2. No intracranial occlusion or stenosis. 3. These results were called by telephone at the time of interpretation on 03/14/2019 at 8:23 pm to Dr. Kerney Elbe , who verbally acknowledged these results. Electronically Signed   By: Ulyses Jarred M.D.   On: 03/14/2019 20:23   Mr Brain Wo Contrast  Result Date: 03/15/2019 CLINICAL DATA:  Slurred speech with RIGHT-sided weakness and RIGHT gaze. Status post clot retrieval from the LEFT ICA. EXAM: MRI HEAD WITHOUT CONTRAST TECHNIQUE: Multiplanar, multiecho pulse sequences of the brain and surrounding structures were obtained without intravenous contrast. COMPARISON:  CTA head neck 03/14/2019. Catheter angiogram 03/14/2019. FINDINGS: The patient was unable to remain motionless for the exam. Small or subtle lesions  could be overlooked. Brain: Small foci of acute infarction are seen in the LEFT hemisphere, MCA territory affecting the LEFT frontal and LEFT posterior frontal cortex and regional white matter. The largest area is located in the precentral gyrus, roughly 1 cm in size. No visible associated hemorrhage. Additional areas of restricted diffusion in the RIGHT subdural space, most notably over the frontal lobe, represent the previously reported subdural hematoma. No significant mass effect from these collections, which measures only a few mm in thickness, maximal 5 mm over the RIGHT inferior frontal lobe. Elsewhere, generalized atrophy. Mild to moderate subcortical and periventricular T2 and FLAIR hyperintensities, likely chronic microvascular ischemic change. Vascular: Normal flow voids. Skull and upper cervical spine: Motion degraded, grossly negative. Sinuses/Orbits: Negative. Other: None. IMPRESSION: Small foci of acute infarction, LEFT MCA territory affecting the frontal and posterior frontal cortex and white matter. See discussion above. No associated hemorrhage. Small volume subdural hematoma over the RIGHT convexity, non worrisome. Atrophy and small vessel disease. Flow voids demonstrate patency of the LEFT carotid system status post clot retrieval. Electronically Signed   By: Staci Righter M.D.   On: 03/15/2019 17:08   Dg Chest Port 1 View  Result Date: 03/15/2019 CLINICAL DATA:  Check endotracheal tube placement EXAM: PORTABLE CHEST 1 VIEW COMPARISON:  03/14/2019 FINDINGS: Cardiac shadow is within normal limits. Endotracheal tube and nasogastric catheter are noted in satisfactory position. The overall inspiratory effort is poor with right basilar atelectasis. No bony abnormality is noted. IMPRESSION: Right basilar atelectasis is seen. Tubes and lines as described. Electronically Signed   By: Inez Catalina M.D.   On: 03/15/2019 08:06   Dg Chest Portable 1 View  Result Date: 03/14/2019 CLINICAL DATA:   Intubation, OG tube placement EXAM: PORTABLE CHEST 1 VIEW COMPARISON:  03/02/2019 FINDINGS: Endotracheal tube is just into the right mainstem bronchus. Recommend retracting approximately 2-3 cm. NG tube tip is in the distal esophagus. Low lung volumes with bibasilar atelectasis. No effusions or acute bony abnormality. IMPRESSION: Right mainstem intubation. Recommend retracting 2-3 cm. NG tube tip in the distal esophagus. Low lung volumes with bibasilar atelectasis. These results were called by telephone at the time of interpretation on 03/14/2019 at 9:19 pm to Dr. Noemi Chapel , who verbally acknowledged these results. Electronically Signed   By: Rolm Baptise M.D.   On: 03/14/2019 21:22   Dg Abd Portable 1 View  Result Date: 03/14/2019 CLINICAL DATA:  OG tube EXAM: PORTABLE ABDOMEN - 1 VIEW COMPARISON:  Chest x-ray today FINDINGS: OG tube tip is in the distal esophagus, better seen on chest x-ray. Nonobstructive bowel gas pattern. Prior cholecystectomy. IMPRESSION: OG tube tip in the distal esophagus, better seen on chest x-ray. Recommend advancing. Electronically Signed   By: Rolm Baptise  M.D.   On: 03/14/2019 21:22   Ct Head Code Stroke Wo Contrast  Result Date: 03/14/2019 CLINICAL DATA:  Code stroke. Slurred speech and right-sided deficits EXAM: CT HEAD WITHOUT CONTRAST TECHNIQUE: Contiguous axial images were obtained from the base of the skull through the vertex without intravenous contrast. COMPARISON:  Head CT 02/11/2019 FINDINGS: Brain: Minimal residual right convexity subdural blood, unchanged compared to 02/11/2019. No acute hemorrhage. The size and configuration of the ventricles and extra-axial CSF spaces are normal. There is hypoattenuation of the periventricular white matter, most commonly indicating chronic ischemic microangiopathy. Vascular: No abnormal hyperdensity of the major intracranial arteries or dural venous sinuses. No intracranial atherosclerosis. Skull: The visualized skull base,  calvarium and extracranial soft tissues are normal. Sinuses/Orbits: No fluid levels or advanced mucosal thickening of the visualized paranasal sinuses. No mastoid or middle ear effusion. The orbits are normal. ASPECTS [facility] Stroke Program Early CT Score) - Ganglionic level infarction (caudate, lentiform nuclei, internal capsule, insula, M1-M3 cortex): 7 - Supraganglionic infarction (M4-M6 cortex): 3 Total score (0-10 with 10 being normal): 10 IMPRESSION: 1. Unchanged appearance of minimal residual right convexity subdural blood without acute hemorrhage. 2. ASPECTS is 10. 3. These results were communicated to Dr. Kerney Elbe at 7:50 pm on 03/14/2019 by text page via the [facility] messaging system. Electronically Signed   By: Ulyses Jarred M.D.   On: 03/14/2019 19:51   Vas [place] Lower Extremity Venous (dvt)  Result Date: 03/15/2019  Lower Venous Study Indications: Stroke, and Edema.  Limitations: Body habitus, poor ultrasound/tissue interface and poor patient cooperation; constant movement. Performing Technologist: Maudry Mayhew MHA, RDMS, RVT, RDCS  Examination Guidelines: A complete evaluation includes B-mode imaging, spectral Doppler, color Doppler, and power Doppler as needed of all accessible portions of each vessel. Bilateral testing is considered an integral part of a complete examination. Limited examinations for reoccurring indications may be performed as noted.  +---------+---------------+---------+-----------+----------+-------------------+  RIGHT     Compressibility Phasicity Spontaneity Properties Summary              +---------+---------------+---------+-----------+----------+-------------------+  CFV       Full                                                                  +---------+---------------+---------+-----------+----------+-------------------+  SFJ                                                        Not visualized        +---------+---------------+---------+-----------+----------+-------------------+  FV Prox   Full                                                                  +---------+---------------+---------+-----------+----------+-------------------+  FV Mid  patent, limited                                                                  visualization        +---------+---------------+---------+-----------+----------+-------------------+  FV Distal                                                  Not visualized       +---------+---------------+---------+-----------+----------+-------------------+  POP       Full                                                                  +---------+---------------+---------+-----------+----------+-------------------+  PTV                                                        patent, limited                                                                  visualization        +---------+---------------+---------+-----------+----------+-------------------+  PERO                                                       Not visualized       +---------+---------------+---------+-----------+----------+-------------------+   +---------+---------------+---------+-----------+----------+--------------+  LEFT      Compressibility Phasicity Spontaneity Properties Summary         +---------+---------------+---------+-----------+----------+--------------+  CFV                                                        Not visualized  +---------+---------------+---------+-----------+----------+--------------+  SFJ                                                        Not visualized  +---------+---------------+---------+-----------+----------+--------------+  FV Prox   Full                                                             +---------+---------------+---------+-----------+----------+--------------+  FV Mid                                                      Not visualized  +---------+---------------+---------+-----------+----------+--------------+  FV Distal                                                  Not visualized  +---------+---------------+---------+-----------+----------+--------------+  PFV                                                        Not visualized  +---------+---------------+---------+-----------+----------+--------------+  POP       Full            Yes       Yes                                    +---------+---------------+---------+-----------+----------+--------------+  PTV                                                        Not visualized  +---------+---------------+---------+-----------+----------+--------------+  PERO                                                       Not visualized  +---------+---------------+---------+-----------+----------+--------------+     Summary: Right: There is no evidence of deep vein thrombosis in the lower extremity. However, portions of this examination were limited- see technologist comments above. No cystic structure found in the popliteal fossa. Left: There is no evidence of deep vein thrombosis in the lower extremity. However, portions of this examination were limited- see technologist comments above. No cystic structure found in the popliteal fossa.  *See table(s) above for measurements and observations. Electronically signed by Ruta Hinds MD on 03/15/2019 at 6:41:43 PM.    Final     Assessment/Plan: Diagnosis: Left MCA infarct Labs and images (see above) independently reviewed.  Records reviewed and summated above.  1. Does the need for close, 24 hr/day medical supervision in concert with the patient's rehab needs make it unreasonable for this patient to be served in a less intensive setting? Yes  2. 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 3. Due to safety, disease management, medication administration and patient education, does the patient require 24 hr/day rehab nursing? Yes 4. Does the patient require coordinated care of a physician, rehab nurse, PT (1-2 hrs/day, 5 days/week), OT (1-2 hrs/day,  5 days/week) and SLP (1-2 hrs/day, 5 days/week) to address physical and functional deficits in the context of the above medical diagnosis(es)? Yes Addressing deficits in the following areas: balance, endurance, locomotion, strength, transferring, bathing, dressing, toileting, cognition, swallowing and psychosocial support 5. Can the patient actively participate in an intensive therapy program of at least 3 hrs of therapy per day at least 5 days per week? Yes 6. The potential for patient to make measurable gains while on inpatient rehab is excellent 7. Anticipated functional outcomes upon discharge from inpatient rehab are modified independent and supervision  with PT, modified independent and supervision with OT, modified independent with SLP. 8. Estimated rehab length of stay to reach the above functional goals is: 10-15 days. 9. Anticipated D/C setting: Home 10. Anticipated post D/C treatments: HH therapy and Home excercise program 11. Overall Rehab/Functional Prognosis: good  RECOMMENDATIONS: This patient's condition is appropriate for continued rehabilitative care in the following setting: CIR when medically stable. Patient has agreed to participate in recommended program. Appears reluctant, but agreeable. Note that insurance prior authorization may be required for reimbursement for recommended care.  Comment: Rehab Admissions Coordinator to follow up.   I have personally performed a face to face diagnostic  evaluation, including, but not limited to relevant history and physical exam findings, of this patient and developed relevant assessment and plan.  Additionally, I have reviewed and concur with the physician assistant's documentation above.   Delice Lesch, MD, ABPMR Lavon Paganini Angiulli, PA-C 03/16/2019

## 2019-03-15 NOTE — Evaluation (Signed)
Physical Therapy Evaluation Patient Details Name: Monica Pearson MRN: [id number] DOB: 08/12/1946 Today's Date: 03/15/2019   History of Present Illness  Patient is a 73 y/o female who presents with slurred speech, right sided weakness, left gaze preference. Head CT-unremarkable. 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 Also noted to have posssible aspiration PNA and UTI? PMH includes SDH, CVA. HTN, DM, cirrhosis.  Clinical Impression  Patient presents with right sided weakness, BLE swelling, lethargy, impaired balance and impaired mobility s/p above. Pt reports being Mod I PTA using RW for mobility and lives alone. Today, pt tolerated transfers and gait training with Mod A of 2 for balance/safety and to assist with RLE progression. Pt follows simple 1 step commands with increased time and cues likely due to lethargy. Noted to have some cognitive deficits as well. Would benefit from rehab to maximize independence and mobility prior to return home. Will follow acutely.     Follow Up Recommendations CIR;Supervision for mobility/OOB    Equipment Recommendations  None recommended by PT    Recommendations for Other Services       Precautions / Restrictions Precautions Precautions: Fall Restrictions Weight Bearing Restrictions: No      Mobility  Bed Mobility Overal bed mobility: Needs Assistance Bed Mobility: Rolling;Sidelying to Sit;Sit to Sidelying Rolling: Max assist;+2 for physical assistance Sidelying to sit: Max assist;+2 for physical assistance     Sit to sidelying: Mod assist General bed mobility comments: ASsist with LEs, bottom and to elevate trunk to get to EOB. Needed assist to bring LEs to return to supine.  Transfers Overall transfer level: Needs assistance Equipment used: 2 person hand held assist Transfers: Sit to/from Stand Sit to Stand: Mod assist;+2 physical assistance         General transfer comment: Assist to power to standing with cues for  forward weight shift; RLE initially locked out into genu recurvatum.   Ambulation/Gait Ambulation/Gait assistance: Mod assist;+2 physical assistance Gait Distance (Feet): 5 Feet Assistive device: 2 person hand held assist Gait Pattern/deviations: Step-to pattern;Wide base of support;Decreased weight shift to right;Decreased weight shift to left Gait velocity: decreased   General Gait Details: Able to take side steps along side bed with Mod A of 2, assist for weight shifting, advancing RLE and for balance. Stabilizing right knee due to instability.   Stairs            Wheelchair Mobility    Modified Rankin (Stroke Patients Only) Modified Rankin (Stroke Patients Only) Pre-Morbid Rankin Score: Slight disability Modified Rankin: Moderately severe disability     Balance Overall balance assessment: Needs assistance Sitting-balance support: Feet supported;Single extremity supported Sitting balance-Leahy Scale: Fair     Standing balance support: During functional activity;Bilateral upper extremity supported Standing balance-Leahy Scale: Poor Standing balance comment: requires external support for standing                             Pertinent Vitals/Pain Pain Assessment: No/denies pain    Home Living Family/patient expects to be discharged to:: Private residence Living Arrangements: Alone Available Help at Discharge: Family;Available PRN/intermittently Type of Home: House Home Access: Stairs to enter Entrance Stairs-Rails: Right Entrance Stairs-Number of Steps: 1 in front vs 5 in back Home Layout: One level Home Equipment: Walker - 2 wheels;Shower seat      Prior Function Level of Independence: Independent with assistive device(s)         Comments: Uses RW for ambulation; drives. [place] at  home     Hand Dominance   Dominant Hand: Right    Extremity/Trunk Assessment   Upper Extremity Assessment Upper Extremity Assessment: Defer to OT  evaluation    Lower Extremity Assessment Lower Extremity Assessment: RLE deficits/detail;LLE deficits/detail RLE Deficits / Details: Grossly ~3/5 throughout; swelling noted. RLE Sensation: WNL LLE Deficits / Details: Grossly ~3+/5 throughout, pitting edema present and swelling in LEs- tender to palpate LLE Sensation: WNL       Communication   Communication: Expressive difficulties(low raspy voice)  Cognition Arousal/Alertness: Lethargic Behavior During Therapy: WFL for tasks assessed/performed Overall Cognitive Status: Impaired/Different from baseline Area of Impairment: Orientation;Attention;Following commands;Problem solving;Safety/judgement                 Orientation Level: Disoriented to;Situation Current Attention Level: Focused   Following Commands: Follows one step commands with increased time(multimodal cues. ) Safety/Judgement: Decreased awareness of safety;Decreased awareness of deficits   Problem Solving: Slow processing;Decreased initiation;Difficulty sequencing;Requires verbal cues;Requires tactile cues General Comments: Lethargic during session needing repetition to answer questions. Knows it is April but needs options to state the year. When asked why she was in the hospital, "the hashbrowns" and then laughs. not able to state situation. I      General Comments General comments (skin integrity, edema, etc.): VSS throughout    Exercises     Assessment/Plan    PT Assessment Patient needs continued PT services  PT Problem List Decreased strength;Decreased balance;Decreased cognition;Decreased mobility;Decreased activity tolerance       PT Treatment Interventions Functional mobility training;Balance training;Patient/family education;Gait training;Therapeutic activities;Therapeutic exercise;Neuromuscular re-education;DME instruction    PT Goals (Current goals can be found in the Care Plan section)  Acute Rehab PT Goals Patient Stated Goal: to get  something to drink/eat PT Goal Formulation: With patient Time For Goal Achievement: 03/29/19 Potential to Achieve Goals: Good    Frequency Min 4X/week   Barriers to discharge Decreased caregiver support lives alone    Co-evaluation PT/OT/SLP Co-Evaluation/Treatment: Yes Reason for Co-Treatment: To address functional/ADL transfers;For patient/therapist safety;Necessary to address cognition/behavior during functional activity PT goals addressed during session: Mobility/safety with mobility;Balance;Strengthening/ROM         AM-PAC PT "6 Clicks" Mobility  Outcome Measure Help needed turning from your back to your side while in a flat bed without using bedrails?: A Lot Help needed moving from lying on your back to sitting on the side of a flat bed without using bedrails?: A Lot Help needed moving to and from a bed to a chair (including a wheelchair)?: A Lot Help needed standing up from a chair using your arms (e.g., wheelchair or bedside chair)?: A Lot Help needed to walk in hospital room?: A Lot Help needed climbing 3-5 steps with a railing? : Total 6 Click Score: 11    End of Session Equipment Utilized During Treatment: Gait belt Activity Tolerance: Patient limited by lethargy Patient left: in bed;with call bell/phone within reach;with bed alarm set;with SCD's reapplied Nurse Communication: Mobility status PT Visit Diagnosis: Muscle weakness (generalized) (M62.81);Hemiplegia and hemiparesis;Difficulty in walking, not elsewhere classified (R26.2);Unsteadiness on feet (R26.81) Hemiplegia - Right/Left: Right Hemiplegia - dominant/non-dominant: Dominant Hemiplegia - caused by: Cerebral infarction    Time: 8882-8003 PT Time Calculation (min) (ACUTE ONLY): 27 min   Charges:   PT Evaluation $PT Eval Moderate Complexity: 1 Mod          Wray Kearns, PT, DPT Acute Rehabilitation Services Pager [phone] Office [phone]      [place] 03/15/2019,  1:17 PM

## 2019-03-15 NOTE — Progress Notes (Addendum)
IR rounding note via telephone due to new regulations. Spoke with Normand Sloop, RN.  Patient underwent an image-guided cerebral arteriogram with emergent mechanical thrombectomy of left ICA proximal and left MCA achieving a TICI 3 revascularization 03/14/2019 by Dr. Estanislado Pandy.  Intubated and sedated. Unable to assess speech/comprehension due to ET tube. PERRL bilaterally. Unable to assess facial asymmetry due to ET tube. Unable to assess tongue protrusion due to ET tube. Can spontaneously move left extremities, right extremities withdraw from pain. EOMs, visual fields, pronator drift, fine motor and coordination, gait, romberg, and heel to toe not assessed. Distal pulses palpable bilaterally with Doppler. Right groin incision soft without active bleeding or hematoma.  Plans per neurology, appreciate and agree with management. Plan to follow-up with Dr. Estanislado Pandy in clinic 4 weeks after discharge. Please call IR with questions/concerns.  Bea Graff Avrie Kedzierski, PA-C 03/15/2019, 10:05 AM

## 2019-03-15 NOTE — Progress Notes (Signed)
Rehab Admissions Coordinator Note:  Patient was screened by Cleatrice Burke for appropriateness for an Inpatient Acute Rehab Consult per PT recs.   At this time, we are recommending Inpatient Rehab consult.  Cleatrice Burke RN  MSN 03/15/2019, 2:10 PM  I can be reached at [phone].

## 2019-03-15 NOTE — Progress Notes (Signed)
OT Cancellation Note  Patient Details Name: Denina Rieger MRN: [id number] DOB: 03-Jul-1946   Cancelled Treatment:    Reason Eval/Treat Not Completed: Patient not medically ready.  Pt currently on a vent post thrombectomy.  Will check back.  Lucille Passy, OTR/L Acute Rehabilitation Services Pager [phone] Office [phone]   Lucille Passy M 03/15/2019, 10:18 AM

## 2019-03-15 NOTE — Progress Notes (Signed)
Place PIV for patient. Instructed nurseTasheena, RN to change Levophed infusion to PIV site and instructed to not use Midline for this class of drug. Upon assessment, patient has limited vasculature. Instructed nurse that if patient needs more IV sites they will need to consider a PICC line or other CVC. VU. Fran Lowes, RN VAST

## 2019-03-15 NOTE — Progress Notes (Signed)
Paged IV team x2 for need of IV access with no return. Will continue to monitor. Lianne Bushy RN BSN.

## 2019-03-15 NOTE — Plan of Care (Signed)
Pt is now extubated and alert and oriented x4, on room air, and following commands. Will continue to monitor. Hiram Gash RN

## 2019-03-15 NOTE — Progress Notes (Signed)
0830- Blood glucose 63. Amp of Dextrose administered. Follow up glucose 116. Lianne Bushy RN BSN.

## 2019-03-16 ENCOUNTER — Inpatient Hospital Stay: Payer: Self-pay

## 2019-03-16 DIAGNOSIS — D62 Acute posthemorrhagic anemia: Secondary | ICD-10-CM

## 2019-03-16 DIAGNOSIS — S065X9A Traumatic subdural hemorrhage with loss of consciousness of unspecified duration, initial encounter: Secondary | ICD-10-CM

## 2019-03-16 DIAGNOSIS — K7460 Unspecified cirrhosis of liver: Secondary | ICD-10-CM

## 2019-03-16 DIAGNOSIS — E118 Type 2 diabetes mellitus with unspecified complications: Secondary | ICD-10-CM

## 2019-03-16 DIAGNOSIS — N179 Acute kidney failure, unspecified: Secondary | ICD-10-CM

## 2019-03-16 DIAGNOSIS — Z720 Tobacco use: Secondary | ICD-10-CM

## 2019-03-16 DIAGNOSIS — E785 Hyperlipidemia, unspecified: Secondary | ICD-10-CM

## 2019-03-16 DIAGNOSIS — J9600 Acute respiratory failure, unspecified whether with hypoxia or hypercapnia: Secondary | ICD-10-CM

## 2019-03-16 DIAGNOSIS — S065XAA Traumatic subdural hemorrhage with loss of consciousness status unknown, initial encounter: Secondary | ICD-10-CM

## 2019-03-16 DIAGNOSIS — Z794 Long term (current) use of insulin: Secondary | ICD-10-CM

## 2019-03-16 LAB — COMPREHENSIVE METABOLIC PANEL
ALT: 87 U/L — ABNORMAL HIGH (ref 0–44)
AST: 92 U/L — ABNORMAL HIGH (ref 15–41)
Albumin: 2.6 g/dL — ABNORMAL LOW (ref 3.5–5.0)
Alkaline Phosphatase: 153 U/L — ABNORMAL HIGH (ref 38–126)
Anion gap: 15 (ref 5–15)
BUN: 70 mg/dL — ABNORMAL HIGH (ref 8–23)
CO2: 18 mmol/L — ABNORMAL LOW (ref 22–32)
Calcium: 8.5 mg/dL — ABNORMAL LOW (ref 8.9–10.3)
Chloride: 108 mmol/L (ref 98–111)
Creatinine, Ser: 1.76 mg/dL — ABNORMAL HIGH (ref 0.44–1.00)
GFR calc Af Amer: 33 mL/min — ABNORMAL LOW (ref >60)
GFR calc non Af Amer: 28 mL/min — ABNORMAL LOW (ref >60)
Glucose, Bld: 89 mg/dL (ref 70–99)
Potassium: 3.9 mmol/L (ref 3.5–5.1)
Sodium: 141 mmol/L (ref 135–145)
Total Bilirubin: 4.8 mg/dL — ABNORMAL HIGH (ref 0.3–1.2)
Total Protein: 5.6 g/dL — ABNORMAL LOW (ref 6.5–8.1)

## 2019-03-16 LAB — AMMONIA: Ammonia: 40 umol/L — ABNORMAL HIGH (ref 9–35)

## 2019-03-16 LAB — CBC
HCT: 24.4 % — ABNORMAL LOW (ref 36.0–46.0)
Hemoglobin: 8.9 g/dL — ABNORMAL LOW (ref 12.0–15.0)
MCH: 25.3 pg — ABNORMAL LOW (ref 26.0–34.0)
MCHC: 36.5 g/dL — ABNORMAL HIGH (ref 30.0–36.0)
MCV: 69.3 fL — ABNORMAL LOW (ref 80.0–100.0)
Platelets: 76 10*3/uL — ABNORMAL LOW (ref 150–400)
RBC: 3.52 MIL/uL — ABNORMAL LOW (ref 3.87–5.11)
RDW: 15.5 % (ref 11.5–15.5)
WBC: 8.9 10*3/uL (ref 4.0–10.5)
nRBC: 0.2 % (ref 0.0–0.2)

## 2019-03-16 LAB — GLUCOSE, CAPILLARY
Glucose-Capillary: 107 mg/dL — ABNORMAL HIGH (ref 70–99)
Glucose-Capillary: 204 mg/dL — ABNORMAL HIGH (ref 70–99)
Glucose-Capillary: 254 mg/dL — ABNORMAL HIGH (ref 70–99)
Glucose-Capillary: 274 mg/dL — ABNORMAL HIGH (ref 70–99)
Glucose-Capillary: 84 mg/dL (ref 70–99)
Glucose-Capillary: 91 mg/dL (ref 70–99)

## 2019-03-16 LAB — HEPARIN INDUCED PLATELET AB (HIT ANTIBODY): Heparin Induced Plt Ab: 0.074 OD (ref 0.000–0.400)

## 2019-03-16 LAB — PROTIME-INR
INR: 1.9 — ABNORMAL HIGH (ref 0.8–1.2)
Prothrombin Time: 21.2 seconds — ABNORMAL HIGH (ref 11.4–15.2)

## 2019-03-16 MED ORDER — DEXTROSE IN LACTATED RINGERS 5 % IV SOLN
INTRAVENOUS | Status: DC
  Administered 2019-03-16 – 2019-03-17 (×2): via INTRAVENOUS

## 2019-03-16 MED ORDER — SODIUM CHLORIDE 0.9% FLUSH
10.0000 mL | INTRAVENOUS | Status: DC | PRN
  Administered 2019-03-17: 09:00:00 20 mL
  Filled 2019-03-16: qty 40

## 2019-03-16 MED ORDER — CHLORHEXIDINE GLUCONATE CLOTH 2 % EX PADS
6.0000 | MEDICATED_PAD | Freq: Every day | CUTANEOUS | Status: DC
  Administered 2019-03-16: 15:00:00 6 via TOPICAL

## 2019-03-16 MED ORDER — ORAL CARE MOUTH RINSE
15.0000 mL | Freq: Two times a day (BID) | OROMUCOSAL | Status: DC
  Administered 2019-03-16 – 2019-03-17 (×4): 15 mL via OROMUCOSAL

## 2019-03-16 MED ORDER — SODIUM CHLORIDE 0.9% FLUSH
10.0000 mL | Freq: Two times a day (BID) | INTRAVENOUS | Status: DC
  Administered 2019-03-16 (×2): 10 mL

## 2019-03-16 MED ORDER — LACTATED RINGERS IV SOLN: INTRAVENOUS | Status: DC

## 2019-03-16 NOTE — Progress Notes (Signed)
Inpatient Rehabilitation-Admissions Coordinator    Met with patient at the bedside to discuss team's recommendation for inpatient rehabilitation. Shared booklets, expectations while in CIR, expected length of stay, and anticipated functional level at DC. Pt is interested in CIR at this time and would like to pursue. AC will follow for medical readiness and insurance authorization.   Please call if questions.   Kelly Wolfe, OTR/L  Rehab Admissions Coordinator  [phone] 03/16/2019 12:25 PM   

## 2019-03-16 NOTE — Progress Notes (Signed)
Peripherally Inserted Central Catheter/Midline Placement  The IV Nurse has discussed with the patient and/or persons authorized to consent for the patient, the purpose of this procedure and the potential benefits and risks involved with this procedure.  The benefits include less needle sticks, lab draws from the catheter, and the patient may be discharged home with the catheter. Risks include, but not limited to, infection, bleeding, blood clot (thrombus formation), and puncture of an artery; nerve damage and irregular heartbeat and possibility to perform a PICC exchange if needed/ordered by physician.  Alternatives to this procedure were also discussed.  Bard Power PICC patient education guide, fact sheet on infection prevention and patient information card has been provided to patient /or left at bedside.    PICC/Midline Placement Documentation  PICC Double Lumen 03/16/19 PICC Right Brachial 36 cm 0 cm (Active)  Indication for Insertion or Continuance of Line Vasoactive infusions 03/16/2019  8:56 AM  Exposed Catheter (cm) 0 cm 03/16/2019  8:56 AM  Site Assessment Clean;Dry;Intact 03/16/2019  8:56 AM  Lumen #1 Status Flushed;Blood return noted;Saline locked 03/16/2019  8:56 AM  Lumen #2 Status Flushed;Blood return noted;Saline locked 03/16/2019  8:56 AM  Dressing Type Transparent 03/16/2019  8:56 AM  Dressing Status Clean;Dry;Intact;Antimicrobial disc in place 03/16/2019  8:56 AM  Dressing Change Due 03/23/19 03/16/2019  8:56 AM       Scotty Court 03/16/2019, 8:59 AM

## 2019-03-16 NOTE — Evaluation (Signed)
Clinical/Bedside Swallow Evaluation Patient Details  Name: Monica Pearson MRN: [id number] Date of Birth: [birth year]  Today's Date: 03/16/2019 Time: SLP Start Time (ACUTE ONLY): 1056 SLP Stop Time (ACUTE ONLY): 1134 SLP Time Calculation (min) (ACUTE ONLY): 15 min  Past Medical History:  Past Medical History:  Diagnosis Date  . Cirrhosis ([place])   . Diabetes mellitus without complication ([place])   . Diabetic retinopathy ([place])   . Diabetic retinopathy ([place])   . GERD (gastroesophageal reflux disease)   . High cholesterol   . Hypertension   . Patient is Jehovah's Witness   . Sarcoidosis   . Stroke ([place])   . Subdural hematoma [facility])    Past Surgical History:  Past Surgical History:  Procedure Laterality Date  . CHOLECYSTECTOMY    . KNEE SURGERY    . RADIOLOGY WITH ANESTHESIA N/A 03/14/2019   Procedure: RADIOLOGY WITH ANESTHESIA;  Surgeon: Luanne Bras, MD;  Location: [facility];  Service: Radiology;  Laterality: N/A;   HPI:  Patient is a 73 yo female who presents with slurred speech, right sided weakness, left gaze preference. MRI small foci of acute infarction, LEFT MCA territory affecting the frontal and posterior frontal cortex and white matter. No associated hemorrhage. Small volume subdural hematoma over the RIGHT convexity, non worrisome. CTA- left internal artery occlusion s/p revascularization 4/19. Intubated 4/19-4/20. Also noted to have posssible aspiration PNA and UTI? PMH includes SDH, CVA. HTN, DM, cirrhosis. CXR The overall inspiratory effort is poor with right basilar atelectasis.   Assessment / Plan / Recommendation Clinical Impression  Short intubation of 1 day not affecting pt's vocal quality; strong volitional cough. Three oz water consumed- no cough, throat clear, wet voice or overt respiratory changes. Subsequent straw sips also tolerated. Pt stated dentures not present however masticated solid cracker timely. SLP became aware of dentures in pt room after assessment; cleaned  and pt donned. Mild decreased labial ROM and will continue Dys 3 recommendation and likey upgrading tomorrow. Thin liquids, pills with water, straws allowed.    SLP Visit Diagnosis: Dysphagia, unspecified (R13.10)    Aspiration Risk  Mild aspiration risk    Diet Recommendation Dysphagia 3 (Mech soft);Thin liquid   Liquid Administration via: Cup;Straw Medication Administration: Whole meds with liquid Supervision: Patient able to self feed Compensations: Slow rate;Small sips/bites Postural Changes: Seated upright at 90 degrees    Other  Recommendations Oral Care Recommendations: Oral care BID   Follow up Recommendations None      Frequency and Duration min 2x/week  2 weeks       Prognosis Prognosis for Safe Diet Advancement: Good      Swallow Study   General HPI: Patient is a 73 yo female who presents with slurred speech, right sided weakness, left gaze preference. MRI small foci of acute infarction, LEFT MCA territory affecting the frontal and posterior frontal cortex and white matter. No associated hemorrhage. Small volume subdural hematoma over the RIGHT convexity, non worrisome. CTA- left internal artery occlusion s/p revascularization 4/19. Intubated 4/19-4/20. Also noted to have posssible aspiration PNA and UTI? PMH includes SDH, CVA. HTN, DM, cirrhosis. CXR The overall inspiratory effort is poor with right basilar atelectasis. Type of Study: Bedside Swallow Evaluation Previous Swallow Assessment: none Diet Prior to this Study: NPO Temperature Spikes Noted: No Respiratory Status: Nasal cannula History of Recent Intubation: Yes Length of Intubations (days): 1 days Date extubated: 03/15/19 Behavior/Cognition: Alert;Cooperative;Pleasant mood Oral Cavity Assessment: Within Functional Limits Oral Care Completed by SLP: Yes Oral Cavity - Dentition: Dentures,  top;Dentures, bottom Vision: Functional for self-feeding Self-Feeding Abilities: Able to feed self Patient  Positioning: Upright in chair Baseline Vocal Quality: Normal Volitional Cough: Strong Volitional Swallow: Able to elicit    Oral/Motor/Sensory Function Overall Oral Motor/Sensory Function: Mild impairment Facial ROM: Reduced right;Suspected CN VII (facial) dysfunction Facial Symmetry: Abnormal symmetry right;Suspected CN VII (facial) dysfunction Facial Strength: Reduced right;Suspected CN VII (facial) dysfunction Facial Sensation: Within Functional Limits Lingual ROM: Other (Comment)(equal reduced) Lingual Symmetry: Within Functional Limits Lingual Strength: Reduced;Suspected CN XII (hypoglossal) dysfunction   Ice Chips Ice chips: Not tested   Thin Liquid Thin Liquid: Within functional limits Presentation: Cup;Straw    Nectar Thick Nectar Thick Liquid: Not tested   Honey Thick Honey Thick Liquid: Not tested   Puree Puree: Within functional limits   Solid     Solid: Within functional limits      [name] 03/16/2019,12:13 PM   [name].Ed Risk analyst [phone] Office [phone]

## 2019-03-16 NOTE — Evaluation (Signed)
Speech Language Pathology Evaluation Patient Details Name: Monica Pearson MRN: [id number] DOB: December 10, 1945 Today's Date: 03/16/2019 Time: 2263-3354 SLP Time Calculation (min) (ACUTE ONLY): 22 min  Problem List:  Patient Active Problem List   Diagnosis Date Noted  . AKI (acute kidney injury) ([place])   . Hepatic cirrhosis ([place])   . SDH (subdural hematoma) ([place])   . Dyslipidemia   . Tobacco abuse   . Acute blood loss anemia   . Acute respiratory failure ([place])   . Endotracheally intubated 03/15/2019  . Sarcoidosis of lung ([place]) 03/15/2019  . Bacteriuria with pyuria 03/15/2019  . Bradycardia 03/15/2019  . CKD stage 3 due to type 2 diabetes mellitus ([place]) 03/15/2019  . Type 2 diabetes mellitus with complication, with long-term current use of insulin ([place]) 03/15/2019  . Microcytic anemia 03/15/2019  . Thrombocytopenia ([place]) 03/15/2019  . Compromised airway   . Stroke (cerebrum) ([place]) 03/14/2019  . Stenosis of left carotid artery 03/14/2019  . Ascites   . Hypomagnesemia   . Hypokalemia   . Liver cirrhosis secondary to NASH ([place])   . Hyperammonemia ([place]) 01/14/2019  . Subdural hematoma ([place]) 01/14/2019  . Type 2 diabetes mellitus with vascular disease ([place]) 01/14/2019  . Acute hepatic encephalopathy 01/14/2019  . Acute metabolic encephalopathy 56/25/6389  . Hyperlipidemia 07/22/2017  . Hypertension 07/22/2017  . Obstructive sleep apnea treated with continuous positive airway pressure (CPAP) 07/22/2017  . Acute CVA (cerebrovascular accident) ([place]) 12/15/2016  . TIA (transient ischemic attack) 12/14/2016  . GERD (gastroesophageal reflux disease) 12/14/2016  . Insulin dependent diabetes mellitus ([place]) 12/14/2016  . No blood products 12/14/2016   Past Medical History:  Past Medical History:  Diagnosis Date  . Cirrhosis ([place])   . Diabetes mellitus without complication ([place])   . Diabetic retinopathy ([place])   . Diabetic retinopathy ([place])   . GERD (gastroesophageal reflux disease)   . High  cholesterol   . Hypertension   . Patient is Jehovah's Witness   . Sarcoidosis   . Stroke ([place])   . Subdural hematoma [facility])    Past Surgical History:  Past Surgical History:  Procedure Laterality Date  . CHOLECYSTECTOMY    . KNEE SURGERY    . RADIOLOGY WITH ANESTHESIA N/A 03/14/2019   Procedure: RADIOLOGY WITH ANESTHESIA;  Surgeon: Luanne Bras, MD;  Location: [place];  Service: Radiology;  Laterality: N/A;   HPI:  Patient is a 73 yo female who presents with slurred speech, right sided weakness, left gaze preference. MRI small foci of acute infarction, LEFT MCA territory affecting the frontal and posterior frontal cortex and white matter. No associated hemorrhage. Small volume subdural hematoma over the RIGHT convexity, non worrisome. CTA- left internal artery occlusion s/p revascularization 4/19. Intubated 4/19-4/20. Also noted to have posssible aspiration PNA and UTI? PMH includes SDH, CVA. HTN, DM, cirrhosis. CXR The overall inspiratory effort is poor with right basilar atelectasis.   Assessment / Plan / Recommendation Clinical Impression  Prior to admission pt lived alone and managed her finances and daily acitivities. She reports cognition is not quite at baseline. On the [facility] she scored a 22 with impairments in recall (recalling 3/5 words) and sustained attention. Pt would benefit from ongoing treatment for improved cognition for premorbid activities in home environment (when appropriate).     SLP Assessment  SLP Recommendation/Assessment: Patient needs continued Speech Lanaguage Pathology Services SLP Visit Diagnosis: Cognitive communication deficit (R41.841)    Follow Up Recommendations  Inpatient Rehab    Frequency and Duration min 2x/week  2  weeks      SLP Evaluation Cognition  Overall Cognitive Status: Impaired/Different from baseline Arousal/Alertness: Awake/alert Orientation Level: Oriented X4 Attention: Sustained Sustained Attention: Impaired Sustained  Attention Impairment: Verbal complex Memory: Impaired Memory Impairment: Retrieval deficit Awareness: (questionable) Problem Solving: (suspects difficulty with high level) Safety/Judgment: Impaired       Comprehension  Auditory Comprehension Overall Auditory Comprehension: Appears within functional limits for tasks assessed Visual Recognition/Discrimination Discrimination: Not tested Reading Comprehension Reading Status: (TBA)    Expression Expression Primary Mode of Expression: Verbal Verbal Expression Overall Verbal Expression: Appears within functional limits for tasks assessed Level of Generative/Spontaneous Verbalization: Conversation Pragmatics: No impairment Written Expression Dominant Hand: Right Written Expression: Not tested   Oral / Motor  Oral Motor/Sensory Function Overall Oral Motor/Sensory Function: Mild impairment Facial ROM: Reduced right;Suspected CN VII (facial) dysfunction Facial Symmetry: Abnormal symmetry right;Suspected CN VII (facial) dysfunction Facial Strength: Reduced right;Suspected CN VII (facial) dysfunction Facial Sensation: Within Functional Limits Lingual ROM: Other (Comment)(equally reduced ROM) Lingual Symmetry: Within Functional Limits Lingual Strength: Reduced;Suspected CN XII (hypoglossal) dysfunction Motor Speech Overall Motor Speech: Appears within functional limits for tasks assessed Respiration: Within functional limits Phonation: Normal Resonance: Within functional limits Articulation: Within functional limitis Intelligibility: Intelligible Motor Planning: Witnin functional limits   GO                    [name] 03/16/2019, 2:32 PM  [name] M.Ed Risk analyst [phone] Office [phone]

## 2019-03-16 NOTE — Progress Notes (Addendum)
NAME:  Monica Pearson, MRN:  [id number], DOB:  09/01/1946, LOS: 2 ADMISSION DATE:  03/14/2019, CONSULTATION DATE:  4/19 REFERRING MD:  Dr. Cheral Marker, CHIEF COMPLAINT:  AMS, R sided weakness   Brief History   73 y/o F, Jehovah's Witness, admitted 4/19 with reports of slurred speech, right-sided weakness and leftward gaze.  CT head negative but CTA neck revealed left internal carotid artery occlusion.  She underwent IR endovascular revascularization with mechanical thrombectomy and was returned to ICU on mechanical ventilation.     Past Medical History  Sarcoidosis  HLD HTN GERD NASH Cirrhosis  SDH  DM  Significant Hospital Events   4/19  Admit, Endovascular Revascularization of L ICA, mechanical thrombectomy  Consults:  PCCM   Procedures:  IR 4/19  RUE Midline 4/20 >> 4/21  RUE PICC 4/21 >>  Significant Diagnostic Tests:  CT Head 4/19 >> negative  CTA Neck 4/19 >> non-occlusive thrombus within the left ICA, no intracranial occlusion or stenosis  Micro Data:  BCx2 4/20 >>  UC 4/20 >> greater 100k GNR >>  Tracheal aspirate 4/20 >>   Antimicrobials:  Zosyn 4/19 >>   Interim history/subjective:  RN reports pt pending SLP evaluation, CBG's remain low.  Concern for LUE bruising / possible IV extravasation.   Objective   Blood pressure (!) 89/52, pulse 63, temperature 97.6 F (36.4 C), temperature source Oral, resp. rate 14, height 5' 3"  (1.6 m), weight 106.2 kg, SpO2 97 %.        Intake/Output Summary (Last 24 hours) at 03/16/2019 1007 Last data filed at 03/16/2019 0900 Gross per 24 hour  Intake 2293.9 ml  Output 2175 ml  Net 118.9 ml   Filed Weights   03/14/19 1900 03/14/19 2014  Weight: 106.2 kg 106.2 kg    Examination: General: elderly female lying in bed in NAD HEENT: MM pink/moist Neuro: awake, alert, speech clear, MAE CV: s1s2 rrr, no m/r/g PULM: even/non-labored, lungs bilaterally clear  JJ:OACZ, non-tender, bsx4 active  Extremities: warm/dry, BUE edema  L>R, bruising to LUE where BP cuff was located (was on Q5 minute BP's yesterday), no clear evidence of IV extravasation, reviewed with IV team at bedside  Skin: no rashes or lesions  Resolved Hospital Problem list   Acute Respiratory Insufficiency   Assessment & Plan:   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  -s/p neuroIR revascularization 4/20 -hx of SDH P: Per Neurology  PT / OT SLP evaluation   R Basilar Atelectasis  P: Pulmonary hygiene- IS, mobilize  O2 as needed to support sats >90% Follow cultures, intermittent CXR  Zosyn as above   Hypotension  Severe Sepsis  -in setting of GNR UTI  P: Wean levophed for MAP >65 Appreciate IV team  Follow cultures   Hx Sarcoidosis  P: Monitor / supportive care   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  P: Continue lactulose  Follow intermittent ammonia   Microcytic Anemia  Jehovah's Witness  Thrombocytopenia  P: Trend CBC  No transfusion due to religious beliefs HIT panel sent on admit ?? > pending  CKD III/IV P: Trend BMP / urinary output Replace electrolytes as indicated Avoid nephrotoxic agents, ensure adequate renal perfusion  DM  Hypoglycemia P: SSI  Change IVF to include dextrose D5LR at 50 until taking PO's consistently   Best practice:  Diet: NPO Pain/Anxiety/Delirium protocol (if indicated): n/a  VAP protocol (if indicated): n/a DVT prophylaxis: SCD's  GI prophylaxis: PPI  Glucose control: n/a Mobility: bedrest  Code Status: Full  Code  Family Communication: Son Dorita Fray) updated 4/20 via phone Disposition:   Labs   CBC: Recent Labs  Lab 03/14/19 1940 03/15/19 0354 03/16/19 0426  WBC 8.9 7.4 8.9  NEUTROABS 7.2 5.3  --   HGB 11.0* 9.5* 8.9*  HCT 30.0* Unable to determine due to a cold agglutinin 24.4*  MCV 69.6* Unable to determine due to a cold agglutinin 69.3*  PLT 97* 83* 76*    Basic Metabolic Panel: Recent Labs  Lab 03/14/19 1940 03/14/19 1941 03/15/19 0354 03/16/19  0426  NA 133*  --  134* 141  K 4.7  --  3.5 3.9  CL 98  --  103 108  CO2 26  --  22 18*  GLUCOSE 172*  --  106* 89  BUN 81*  --  76* 70*  CREATININE 1.78* 1.70* 1.72* 1.76*  CALCIUM 8.3*  --  8.1* 8.5*   GFR: Estimated Creatinine Clearance: 33.2 mL/min (A) (by C-G formula based on SCr of 1.76 mg/dL (H)). Recent Labs  Lab 03/14/19 1940 03/15/19 0354 03/16/19 0426  WBC 8.9 7.4 8.9    Liver Function Tests: Recent Labs  Lab 03/14/19 1940 03/16/19 0426  AST 104* 92*  ALT 102* 87*  ALKPHOS 225* 153*  BILITOT 2.6* 4.8*  PROT 5.8* 5.6*  ALBUMIN 2.1* 2.6*   No results for input(s): LIPASE, AMYLASE in the last 168 hours. Recent Labs  Lab 03/14/19 1941 03/16/19 0426  AMMONIA 55* 40*    ABG    Component Value Date/Time   PHART 7.443 03/15/2019 0600   PCO2ART 32.3 03/15/2019 0600   PO2ART 163 (H) 03/15/2019 0600   HCO3 21.8 03/15/2019 0600   ACIDBASEDEF 1.8 03/15/2019 0600   O2SAT 99.2 03/15/2019 0600     Coagulation Profile: Recent Labs  Lab 03/14/19 1940 03/16/19 0426  INR 1.6* 1.9*    Cardiac Enzymes: No results for input(s): CKTOTAL, CKMB, CKMBINDEX, TROPONINI in the last 168 hours.  HbA1C: Hgb A1c MFr Bld  Date/Time Value Ref Range Status  03/15/2019 03:54 AM 6.2 (H) 4.8 - 5.6 % Final    Comment:    (NOTE)         Prediabetes: 5.7 - 6.4         Diabetes: >6.4         Glycemic control for adults with diabetes: <7.0   01/16/2019 05:49 AM 7.1 (H) 4.8 - 5.6 % Final    Comment:    (NOTE) Pre diabetes:          5.7%-6.4% Diabetes:              >6.4% Glycemic control for   <7.0% adults with diabetes     CBG: Recent Labs  Lab 03/15/19 1700 03/15/19 1953 03/15/19 2338 03/16/19 0331 03/16/19 0747  GLUCAP 86 77 81 91 84    Critical care time: 30 minutes     Noe Gens, NP-C [facility] Pgr: [phone] or if no answer [phone] 03/16/2019, 10:07 AM

## 2019-03-16 NOTE — Progress Notes (Signed)
STROKE TEAM PROGRESS NOTE   INTERVAL HISTORY RN is at bedside.  Patient has been extubated yesterday, currently awake alert, orientated, still has mild right facial droop but otherwise neurologically intact.  Still has low BP, on Levophed.  INR today 1.9.  I had long discussion with son and daughter-in-law over the phone, updated pt current condition, treatment plan and potential prognosis. They expressed understanding and appreciation.    Vitals:   03/16/19 0400 03/16/19 0500 03/16/19 0600 03/16/19 0700  BP: (!) 112/49 (!) 94/53 (!) 110/47 (!) 120/39  Pulse: 67 66 66 66  Resp: 15 12 15 12   Temp: 97.6 F (36.4 C)     TempSrc: Oral     SpO2: 96% 96% 97% 97%  Weight:      Height:        CBC:  Recent Labs  Lab 03/14/19 1940 03/15/19 0354 03/16/19 0426  WBC 8.9 7.4 8.9  NEUTROABS 7.2 5.3  --   HGB 11.0* 9.5* 8.9*  HCT 30.0* Unable to determine due to a cold agglutinin 24.4*  MCV 69.6* Unable to determine due to a cold agglutinin 69.3*  PLT 97* 83* 76*    Basic Metabolic Panel:  Recent Labs  Lab 03/15/19 0354 03/16/19 0426  NA 134* 141  K 3.5 3.9  CL 103 108  CO2 22 18*  GLUCOSE 106* 89  BUN 76* 70*  CREATININE 1.72* 1.76*  CALCIUM 8.1* 8.5*   Lipid Panel:     Component Value Date/Time   CHOL 165 03/15/2019 0354   TRIG 51 03/15/2019 0354   TRIG 51 03/15/2019 0354   HDL 40 (L) 03/15/2019 0354   CHOLHDL 4.1 03/15/2019 0354   VLDL 10 03/15/2019 0354   LDLCALC 115 (H) 03/15/2019 0354   HgbA1c:  Lab Results  Component Value Date   HGBA1C 6.2 (H) 03/15/2019   Urine Drug Screen:     Component Value Date/Time   LABOPIA NONE DETECTED 03/14/2019 2103   COCAINSCRNUR NONE DETECTED 03/14/2019 2103   LABBENZ NONE DETECTED 03/14/2019 2103   AMPHETMU NONE DETECTED 03/14/2019 2103   THCU NONE DETECTED 03/14/2019 2103   LABBARB NONE DETECTED 03/14/2019 2103    Alcohol Level     Component Value Date/Time   ETH <10 03/14/2019 1940    IMAGING Ct Angio Head W Or  Wo Contrast  Result Date: 03/14/2019 CLINICAL DATA:  Right-sided deficits EXAM: CT ANGIOGRAPHY HEAD AND NECK TECHNIQUE: Multidetector CT imaging of the head and neck was performed using the standard protocol during bolus administration of intravenous contrast. Multiplanar CT image reconstructions and MIPs were obtained to evaluate the vascular anatomy. Carotid stenosis measurements (when applicable) are obtained utilizing NASCET criteria, using the distal internal carotid diameter as the denominator. CONTRAST:  39m OMNIPAQUE IOHEXOL 350 MG/ML SOLN COMPARISON:  Head CT 03/14/2019 FINDINGS: CTA NECK FINDINGS SKELETON: There is no bony spinal canal stenosis. No lytic or blastic lesion. OTHER NECK: Normal pharynx, larynx and major salivary glands. No cervical lymphadenopathy. Unremarkable thyroid gland. UPPER CHEST: No pneumothorax or pleural effusion. No nodules or masses. AORTIC ARCH: There is no calcific atherosclerosis of the aortic arch. There is no aneurysm, dissection or hemodynamically significant stenosis of the visualized ascending aorta and aortic arch. Conventional 3 vessel aortic branching pattern. The visualized proximal subclavian arteries are widely patent. RIGHT CAROTID SYSTEM: --Common carotid artery: Widely patent origin without common carotid artery dissection or aneurysm. --Internal carotid artery: Normal without aneurysm, dissection or stenosis. --External carotid artery: No acute abnormality. LEFT CAROTID  SYSTEM: --Common carotid artery: Widely patent origin without common carotid artery dissection or aneurysm. --Internal carotid artery: Qualitative narrowing of the proximal ICA, which otherwise remains patent to the skull base. There is low-attenuation thrombus within the proximal left ICA causing severe --External carotid artery: No acute abnormality. VERTEBRAL ARTERIES: Left dominant configuration. Both origins are normal. No dissection, occlusion or flow-limiting stenosis to the  vertebrobasilar confluence. CTA HEAD FINDINGS POSTERIOR CIRCULATION: --Vertebral arteries: Normal codominant configuration of V4 segments. --Posterior inferior cerebellar arteries (PICA): Patent origins from the vertebral arteries. --Anterior inferior cerebellar arteries (AICA): The right AICA originates normally from the basilar artery. Left AICA not clearly visualized. --Basilar artery: Normal. --Superior cerebellar arteries: Normal. --Posterior cerebral arteries (PCA): Normal. Both originate from the basilar artery. Posterior communicating arteries (p-comm) are diminutive . ANTERIOR CIRCULATION: --Intracranial internal carotid arteries: Normal. --Anterior cerebral arteries (ACA): Normal. Both A1 segments are present. Patent anterior communicating artery (a-comm). --Middle cerebral arteries (MCA): Normal. VENOUS SINUSES: As permitted by contrast timing, patent. ANATOMIC VARIANTS: None Review of the MIP images confirms the above findings. IMPRESSION: 1. 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 This causes severe qualitative narrowing of proximal ICA. This be a source for small downstream emboli. 2. No intracranial occlusion or stenosis. 3. These results were called by telephone at the time of interpretation on 03/14/2019 at 8:23 pm to Dr. Kerney Elbe , who verbally acknowledged these results. Electronically Signed   By: Ulyses Jarred M.D.   On: 03/14/2019 20:23   Ct Angio Neck W Or Wo Contrast  Result Date: 03/14/2019 CLINICAL DATA:  Right-sided deficits EXAM: CT ANGIOGRAPHY HEAD AND NECK TECHNIQUE: Multidetector CT imaging of the head and neck was performed using the standard protocol during bolus administration of intravenous contrast. Multiplanar CT image reconstructions and MIPs were obtained to evaluate the vascular anatomy. Carotid stenosis measurements (when applicable) are obtained utilizing NASCET criteria, using the distal internal  carotid diameter as the denominator. CONTRAST:  7m OMNIPAQUE IOHEXOL 350 MG/ML SOLN COMPARISON:  Head CT 03/14/2019 FINDINGS: CTA NECK FINDINGS SKELETON: There is no bony spinal canal stenosis. No lytic or blastic lesion. OTHER NECK: Normal pharynx, larynx and major salivary glands. No cervical lymphadenopathy. Unremarkable thyroid gland. UPPER CHEST: No pneumothorax or pleural effusion. No nodules or masses. AORTIC ARCH: There is no calcific atherosclerosis of the aortic arch. There is no aneurysm, dissection or hemodynamically significant stenosis of the visualized ascending aorta and aortic arch. Conventional 3 vessel aortic branching pattern. The visualized proximal subclavian arteries are widely patent. RIGHT CAROTID SYSTEM: --Common carotid artery: Widely patent origin without common carotid artery dissection or aneurysm. --Internal carotid artery: Normal without aneurysm, dissection or stenosis. --External carotid artery: No acute abnormality. LEFT CAROTID SYSTEM: --Common carotid artery: Widely patent origin without common carotid artery dissection or aneurysm. --Internal carotid artery: Qualitative narrowing of the proximal ICA, which otherwise remains patent to the skull base. There is low-attenuation thrombus within the proximal left ICA causing severe --External carotid artery: No acute abnormality. VERTEBRAL ARTERIES: Left dominant configuration. Both origins are normal. No dissection, occlusion or flow-limiting stenosis to the vertebrobasilar confluence. CTA HEAD FINDINGS POSTERIOR CIRCULATION: --Vertebral arteries: Normal codominant configuration of V4 segments. --Posterior inferior cerebellar arteries (PICA): Patent origins from the vertebral arteries. --Anterior inferior cerebellar arteries (AICA): The right AICA originates normally from the basilar artery. Left AICA not clearly visualized. --Basilar artery: Normal. --Superior cerebellar arteries: Normal. --Posterior cerebral arteries (PCA):  Normal. Both originate from the  basilar artery. Posterior communicating arteries (p-comm) are diminutive . ANTERIOR CIRCULATION: --Intracranial internal carotid arteries: Normal. --Anterior cerebral arteries (ACA): Normal. Both A1 segments are present. Patent anterior communicating artery (a-comm). --Middle cerebral arteries (MCA): Normal. VENOUS SINUSES: As permitted by contrast timing, patent. ANATOMIC VARIANTS: None Review of the MIP images confirms the above findings. IMPRESSION: 1. 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 This causes severe qualitative narrowing of proximal ICA. This be a source for small downstream emboli. 2. No intracranial occlusion or stenosis. 3. These results were called by telephone at the time of interpretation on 03/14/2019 at 8:23 pm to Dr. Kerney Elbe , who verbally acknowledged these results. Electronically Signed   By: Ulyses Jarred M.D.   On: 03/14/2019 20:23   Mr Brain Wo Contrast  Result Date: 03/15/2019 CLINICAL DATA:  Slurred speech with RIGHT-sided weakness and RIGHT gaze. Status post clot retrieval from the LEFT ICA. EXAM: MRI HEAD WITHOUT CONTRAST TECHNIQUE: Multiplanar, multiecho pulse sequences of the brain and surrounding structures were obtained without intravenous contrast. COMPARISON:  CTA head neck 03/14/2019. Catheter angiogram 03/14/2019. FINDINGS: The patient was unable to remain motionless for the exam. Small or subtle lesions could be overlooked. Brain: Small foci of acute infarction are seen in the LEFT hemisphere, MCA territory affecting the LEFT frontal and LEFT posterior frontal cortex and regional white matter. The largest area is located in the precentral gyrus, roughly 1 cm in size. No visible associated hemorrhage. Additional areas of restricted diffusion in the RIGHT subdural space, most notably over the frontal lobe, represent the previously reported subdural hematoma. No significant  mass effect from these collections, which measures only a few mm in thickness, maximal 5 mm over the RIGHT inferior frontal lobe. Elsewhere, generalized atrophy. Mild to moderate subcortical and periventricular T2 and FLAIR hyperintensities, likely chronic microvascular ischemic change. Vascular: Normal flow voids. Skull and upper cervical spine: Motion degraded, grossly negative. Sinuses/Orbits: Negative. Other: None. IMPRESSION: Small foci of acute infarction, LEFT MCA territory affecting the frontal and posterior frontal cortex and white matter. See discussion above. No associated hemorrhage. Small volume subdural hematoma over the RIGHT convexity, non worrisome. Atrophy and small vessel disease. Flow voids demonstrate patency of the LEFT carotid system status post clot retrieval. Electronically Signed   By: Staci Righter M.D.   On: 03/15/2019 17:08   Dg Chest Port 1 View  Result Date: 03/15/2019 CLINICAL DATA:  Check endotracheal tube placement EXAM: PORTABLE CHEST 1 VIEW COMPARISON:  03/14/2019 FINDINGS: Cardiac shadow is within normal limits. Endotracheal tube and nasogastric catheter are noted in satisfactory position. The overall inspiratory effort is poor with right basilar atelectasis. No bony abnormality is noted. IMPRESSION: Right basilar atelectasis is seen. Tubes and lines as described. Electronically Signed   By: Inez Catalina M.D.   On: 03/15/2019 08:06   Dg Chest Portable 1 View  Result Date: 03/14/2019 CLINICAL DATA:  Intubation, OG tube placement EXAM: PORTABLE CHEST 1 VIEW COMPARISON:  03/02/2019 FINDINGS: Endotracheal tube is just into the right mainstem bronchus. Recommend retracting approximately 2-3 cm. NG tube tip is in the distal esophagus. Low lung volumes with bibasilar atelectasis. No effusions or acute bony abnormality. IMPRESSION: Right mainstem intubation. Recommend retracting 2-3 cm. NG tube tip in the distal esophagus. Low lung volumes with bibasilar atelectasis. These  results were called by telephone at the time of interpretation on 03/14/2019 at 9:19 pm to Dr. Noemi Chapel , who verbally acknowledged these results.  Electronically Signed   By: Rolm Baptise M.D.   On: 03/14/2019 21:22   Dg Abd Portable 1 View  Result Date: 03/14/2019 CLINICAL DATA:  OG tube EXAM: PORTABLE ABDOMEN - 1 VIEW COMPARISON:  Chest x-ray today FINDINGS: OG tube tip is in the distal esophagus, better seen on chest x-ray. Nonobstructive bowel gas pattern. Prior cholecystectomy. IMPRESSION: OG tube tip in the distal esophagus, better seen on chest x-ray. Recommend advancing. Electronically Signed   By: Rolm Baptise M.D.   On: 03/14/2019 21:22   Ct Head Code Stroke Wo Contrast  Result Date: 03/14/2019 CLINICAL DATA:  Code stroke. Slurred speech and right-sided deficits EXAM: CT HEAD WITHOUT CONTRAST TECHNIQUE: Contiguous axial images were obtained from the base of the skull through the vertex without intravenous contrast. COMPARISON:  Head CT 02/11/2019 FINDINGS: Brain: Minimal residual right convexity subdural blood, unchanged compared to 02/11/2019. No acute hemorrhage. The size and configuration of the ventricles and extra-axial CSF spaces are normal. There is hypoattenuation of the periventricular white matter, most commonly indicating chronic ischemic microangiopathy. Vascular: No abnormal hyperdensity of the major intracranial arteries or dural venous sinuses. No intracranial atherosclerosis. Skull: The visualized skull base, calvarium and extracranial soft tissues are normal. Sinuses/Orbits: No fluid levels or advanced mucosal thickening of the visualized paranasal sinuses. No mastoid or middle ear effusion. The orbits are normal. ASPECTS [facility] Stroke Program Early CT Score) - Ganglionic level infarction (caudate, lentiform nuclei, internal capsule, insula, M1-M3 cortex): 7 - Supraganglionic infarction (M4-M6 cortex): 3 Total score (0-10 with 10 being normal): 10 IMPRESSION: 1. Unchanged  appearance of minimal residual right convexity subdural blood without acute hemorrhage. 2. ASPECTS is 10. 3. These results were communicated to Dr. Kerney Elbe at 7:50 pm on 03/14/2019 by text page via the [facility] messaging system. Electronically Signed   By: Ulyses Jarred M.D.   On: 03/14/2019 19:51   Vas [place] Lower Extremity Venous (dvt)  Result Date: 03/15/2019  Lower Venous Study Indications: Stroke, and Edema.  Limitations: Body habitus, poor ultrasound/tissue interface and poor patient cooperation; constant movement. Performing Technologist: Maudry Mayhew MHA, RDMS, RVT, RDCS  Examination Guidelines: A complete evaluation includes B-mode imaging, spectral Doppler, color Doppler, and power Doppler as needed of all accessible portions of each vessel. Bilateral testing is considered an integral part of a complete examination. Limited examinations for reoccurring indications may be performed as noted.  +---------+---------------+---------+-----------+----------+-------------------+ RIGHT    CompressibilityPhasicitySpontaneityPropertiesSummary             +---------+---------------+---------+-----------+----------+-------------------+ CFV      Full                                                             +---------+---------------+---------+-----------+----------+-------------------+ SFJ                                                   Not visualized      +---------+---------------+---------+-----------+----------+-------------------+ FV Prox  Full                                                             +---------+---------------+---------+-----------+----------+-------------------+  FV Mid                                                patent, limited                                                           visualization       +---------+---------------+---------+-----------+----------+-------------------+ FV Distal                                              Not visualized      +---------+---------------+---------+-----------+----------+-------------------+ POP      Full                                                             +---------+---------------+---------+-----------+----------+-------------------+ PTV                                                   patent, limited                                                           visualization       +---------+---------------+---------+-----------+----------+-------------------+ PERO                                                  Not visualized      +---------+---------------+---------+-----------+----------+-------------------+   +---------+---------------+---------+-----------+----------+--------------+ LEFT     CompressibilityPhasicitySpontaneityPropertiesSummary        +---------+---------------+---------+-----------+----------+--------------+ CFV                                                   Not visualized +---------+---------------+---------+-----------+----------+--------------+ SFJ                                                   Not visualized +---------+---------------+---------+-----------+----------+--------------+ FV Prox  Full                                                        +---------+---------------+---------+-----------+----------+--------------+  FV Mid                                                Not visualized +---------+---------------+---------+-----------+----------+--------------+ FV Distal                                             Not visualized +---------+---------------+---------+-----------+----------+--------------+ PFV                                                   Not visualized +---------+---------------+---------+-----------+----------+--------------+ POP      Full           Yes      Yes                                  +---------+---------------+---------+-----------+----------+--------------+ PTV                                                   Not visualized +---------+---------------+---------+-----------+----------+--------------+ PERO                                                  Not visualized +---------+---------------+---------+-----------+----------+--------------+     Summary: Right: There is no evidence of deep vein thrombosis in the lower extremity. However, portions of this examination were limited- see technologist comments above. No cystic structure found in the popliteal fossa. Left: There is no evidence of deep vein thrombosis in the lower extremity. However, portions of this examination were limited- see technologist comments above. No cystic structure found in the popliteal fossa.  *See table(s) above for measurements and observations. Electronically signed by Ruta Hinds MD on 03/15/2019 at 6:41:43 PM.    Final    [place] Ekg Site Rite  Result Date: 03/16/2019 If Site Rite image not attached, placement could not be confirmed due to current cardiac rhythm.  Cerebral angio S/P lt common carotid arteriogram followed by complete revascularization of symptomatic severe stenosis of Lt ICA prox sec to a large nearly occlusive flow limiting thrombus with mechanical thrombectomy with x 1 pass with a 41m x 40 mm solitaire retreiver with prox flow arrest and penumbra aspiration . LT MCA TICI 3 revascularization .  2D Echocardiogram   1. The left ventricle has hyperdynamic systolic function, with an ejection fraction of >65%. The cavity size was normal. Left ventricular diastolic Doppler parameters are consistent with impaired relaxation. No evidence of left ventricular regional wall  motion abnormalities.  2. Left atrial size was mildly dilated.  3. The ascending aorta and aortic root are normal in size and structure.   PHYSICAL EXAM  Temp:  [97.6 F (36.4 C)-97.8 F (36.6 C)] 97.6 F  (36.4 C) (04/21 0400) Pulse Rate:  [43-85] 66 (04/21 0700) Resp:  [11-25] 12 (04/21 0700) BP: (88-128)/(35-109) 120/39 (  04/21 0700) SpO2:  [93 %-100 %] 97 % (04/21 0700) Arterial Line BP: (75-150)/(37-59) 96/37 (04/20 1500) FiO2 (%):  [40 %] 40 % (04/20 1000)  General - Well nourished, well developed, mildly lethargic.  Ophthalmologic - fundi not visualized due to noncooperation.  Cardiovascular - Regular rate and rhythm with intermittent PVCs.  Neuro - awake alert, follow simple commands. No aphasia, fully orientated, able to make joke with me. Naming 2/3 and able to repeat. Visual field full, PERRL, EOMI, mild right nasolabial fold flattening, tongue midline. BUE 4/5 proximal and 4+/5 distally, BLE 3/5 proximal and 5/5 distal. Sensation symmetrical, FTN bilaterally intact but slow in action. DTR 1+ and no babinski. Gait not tested.    ASSESSMENT/PLAN Ms. Monica Pearson is a 73 y.o. female with history of NASH cirrhoisis, DB, GERD, HTN, sarcoid, [facility] Jan 2020 presenting with transient slurred speech, R sided weakness and L gaze that resolved enroute. In ED, devloped dysarthria and R sided weakness.   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 ASPECTS 10.     CTA head & neck L ICA nonocclusive thrombus at origin 12m long w/ severe narrowing. No IC occlusion/stenosis. Left PCA stenosis   Cerebral angio TICI3 revascularization severe stenosis L ICA w/ solitaire and penumbra  Post IR CT no ICH or mass effect MRI  Small L MCA infarct frontal and posterior frontal cortex and white matter. Small SDH over R convexity  2D Echo EF >65%. No source of embolus   LE venous doppler no DVT  LDL 115  HgbA1c 7.1  SCDs for VTE prophylaxis - INR 1.6->1.9  No antithrombotic prior to admission, on No antithrombotic for now given recent SDH and elevated  INR with thrombocytopenia   Therapy recommendations:  CIR   Disposition:  pending   Acute Respiratory Failure Potential Aspiration PNA Sarcoidosis  Intubated for mechanical thrombectomy  Extubated 4/20  Trach aspirate - abundant WBC, no organisms  Empiric zosyn for aspiration & UTI coverage  TRH now following  Uncompensated cirrhosis  With baseline ascites and leg edema  LE venous doppler neg DVT  Hyperammonemia @ 55->40 - on lactulose  Elevated INR 1.9->1.6->1.9, aPTT 42  Thrombocytopenia 97->83->76  Renal failure Cre 1.70->1.72->1.76  Anemia Hb 11.0->9.5->8.9  completed albumin infusion  TRH on board  Hx of stroke / SDH  12/2018 - acute on chronic SDH, EEG neg for seizure - improving and now only minimal residue   11/2016 admitted for right side weakness, MRI left pontine infarct, MRA neg, CUS/TTE neg. LDL 104 and A1C 8.3. Her ASA changed to plavix and added zetia  [facility]  Home meds:  Lasix 20, propranolol 10 bid, spironolactone 50  On levophed . SBP goal 100-140 . As per daughter-in-law, baseline BP at home 100s  Hyperlipidemia  Home meds:  zetia 10  LDL 115, goal < 70  No statin due to elevated LFT and cirrhosis  Continue zetia at discharge  Diabetes type II Uncontrolled w/ retinopathy, nephropathy, vasculopathy  Home meds:  Glipizide 10, lispro 15u tid, liraglutide 1.8 q hs, metformin 500 bid  HgbA1c 7.1, goal < 7.0  CBGs  SSI 0-15  Pyuria  Empiric zosyn for aspiration & UTI coverage  Blood Cx >100k GNR, pending   U Cx pending   Dysphagia  Secondary  to stroke  Failed swallow study  Remains NPO  SLP following  Other Stroke Risk Factors  Advanced age  Former Cigarette smoker  Morbid Obesity, Body mass index is 41.47 kg/m., recommend weight loss, diet and exercise as appropriate   Family hx stroke (mother)  PVD  OSA with CPAP at home  Other Active  Problems  GERD  [facility] day # 2  This patient is critically ill due to stroke, left ICA near occlusion, uncompensated cirrhosis, respiratory failure, hypotension, hyperglycemia and at significant risk of neurological worsening, death form recurrent stroke, hemorrhagic conversion, cirrhosis, sepsis, DKA, seizure. This patient's care requires constant monitoring of vital signs, hemodynamics, respiratory and cardiac monitoring, review of multiple databases, neurological assessment, discussion with family, other specialists and medical decision making of high complexity. I spent 35 minutes of neurocritical care time in the care of this patient. I had long discussion with son and daughter-in-law over the phone, updated pt current condition, treatment plan and potential prognosis. They expressed understanding and appreciation.    Monica Hawking, MD PhD Stroke Neurology 03/16/2019 8:26 AM   To contact Stroke Continuity provider, please refer to [email or web address] After hours, contact General Neurology

## 2019-03-16 NOTE — Plan of Care (Signed)
Pt was evaluated by speech and given a Dysphagia 3 diet.  Will continue to monitor.  Hiram Gash RN

## 2019-03-16 NOTE — Progress Notes (Signed)
Telephone call placed to RN regarding medications appropriate for midline. RN states PICC nurse in room and will use PICC line once placed. Consult cleared.

## 2019-03-16 NOTE — Progress Notes (Signed)
Physical Therapy Treatment Patient Details Name: Monica Pearson MRN: [id number] DOB: [birth year] Today's Date: 03/16/2019    History of Present Illness Patient is a 73 y/o female who presents with slurred speech, right sided weakness, left gaze preference. Head CT-unremarkable. 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 Also noted to have posssible aspiration PNA and UTI? PMH includes SDH, CVA. HTN, DM, cirrhosis.    PT Comments    Patient progressing well towards PT goals. More alert today. Tolerated gait training with Min A for balance/safety and use of RW. Pt with decreased foot clearance and progression of RLE noted when fatigued. Highly motivated to improve mobility and participate with therapies. Pt with some cognitive deficits- poor awareness of deficits and impaired attention. Education re: [employer]. Continues to be a good rehab candidate. Will follow.    Follow Up Recommendations  CIR;Supervision for mobility/OOB     Equipment Recommendations  None recommended by PT    Recommendations for Other Services       Precautions / Restrictions Precautions Precautions: Fall Restrictions Weight Bearing Restrictions: No    Mobility  Bed Mobility Overal bed mobility: Needs Assistance Bed Mobility: Rolling;Sidelying to Sit Rolling: Min guard Sidelying to sit: Min assist;HOB elevated       General bed mobility comments: Assist to elevate trunk to get to EOB, use of rail and increased time.   Transfers Overall transfer level: Needs assistance Equipment used: Rolling walker (2 wheeled) Transfers: Sit to/from Stand Sit to Stand: Mod assist;+2 physical assistance;Min assist         General transfer comment: Assist of 2 to power to standing initially with cues for use of momentum; stood from EOB x1, from [facility] x1, from chair x1.  Ambulation/Gait Ambulation/Gait assistance: Min assist;+2 safety/equipment Gait Distance (Feet): 14 Feet(+15') Assistive device: Rolling  walker (2 wheeled) Gait Pattern/deviations: Step-through pattern;Decreased stride length;Decreased step length - right;Wide base of support Gait velocity: decreased   General Gait Details: Slow, mildly unsteady gait with decreased step length RLE, and decreased foot clearance RLE esp when fatigued. 1 seated rest break due to needing to have a BM.   Stairs             Wheelchair Mobility    Modified Rankin (Stroke Patients Only) Modified Rankin (Stroke Patients Only) Pre-Morbid Rankin Score: Slight disability Modified Rankin: Moderately severe disability     Balance Overall balance assessment: Needs assistance Sitting-balance support: Feet supported;Single extremity supported Sitting balance-Leahy Scale: Fair     Standing balance support: During functional activity;Single extremity supported Standing balance-Leahy Scale: Poor Standing balance comment: Able to stand with 1 UE support in standing. total A for pericare.                            Cognition Arousal/Alertness: Awake/alert Behavior During Therapy: WFL for tasks assessed/performed Overall Cognitive Status: Impaired/Different from baseline Area of Impairment: Orientation;Attention;Following commands;Safety/judgement;Awareness                 Orientation Level: Disoriented to;Situation Current Attention Level: Selective   Following Commands: Follows one step commands with increased time;Follows one step commands consistently Safety/Judgement: Decreased awareness of deficits Awareness: Emergent   General Comments: More alert and interactive today. 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 Wants to go home.      Exercises      General Comments General comments (skin integrity, edema, etc.): VSS throughout.      Pertinent Vitals/Pain Pain Assessment: Faces Faces  Pain Scale: Hurts a little bit Pain Location: lip Pain Descriptors / Indicators: Discomfort Pain Intervention(s):  Monitored during session    Home Living                      Prior Function            PT Goals (current goals can now be found in the care plan section) Progress towards PT goals: Progressing toward goals    Frequency    Min 4X/week      PT Plan Current plan remains appropriate    Co-evaluation              AM-PAC PT "6 Clicks" Mobility   Outcome Measure  Help needed turning from your back to your side while in a flat bed without using bedrails?: A Little Help needed moving from lying on your back to sitting on the side of a flat bed without using bedrails?: A Lot Help needed moving to and from a bed to a chair (including a wheelchair)?: A Little Help needed standing up from a chair using your arms (e.g., wheelchair or bedside chair)?: A Lot Help needed to walk in hospital room?: A Little Help needed climbing 3-5 steps with a railing? : A Lot 6 Click Score: 15    End of Session Equipment Utilized During Treatment: Gait belt Activity Tolerance: Patient tolerated treatment well Patient left: in chair;with call bell/phone within reach;with chair alarm set Nurse Communication: Mobility status PT Visit Diagnosis: Muscle weakness (generalized) (M62.81);Hemiplegia and hemiparesis;Difficulty in walking, not elsewhere classified (R26.2);Unsteadiness on feet (R26.81) Hemiplegia - Right/Left: Right Hemiplegia - dominant/non-dominant: Dominant Hemiplegia - caused by: Cerebral infarction     Time: 1005-1037 PT Time Calculation (min) (ACUTE ONLY): 32 min  Charges:  $Gait Training: 23-37 mins                     Wray Kearns, PT, DPT Acute Rehabilitation Services Pager [phone] Office [place] 03/16/2019, 10:47 AM

## 2019-03-16 NOTE — TOC Initial Note (Signed)
Transition of Care [facility]) - Initial/Assessment Note    Patient Details  Name: Monica Pearson MRN: [id number] Date of Birth: 1946-07-05  [facility]) CM/SW Contact:    Ella Bodo, RN Phone Number: 03/16/2019, 2:59 PM  Clinical Narrative: Patient is a 73 y/o female who presents with slurred speech, right sided weakness, left gaze preference. Head CT-unremarkable. 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 Also noted to have posssible aspiration PNA and UTI.  PTA, pt fully independent, lives at home alone.  PT/OT recommending CIR, and consult in process.  Family able to provide intermittent assistance at discharge.                Expected Discharge Plan: IP Rehab Facility Barriers to Discharge: Continued Medical Work up   Patient Goals and CMS Choice        Expected Discharge Plan and Services Expected Discharge Plan: [place]       Living arrangements for the past 2 months: Single Family Home                          Prior Living Arrangements/Services Living arrangements for the past 2 months: Single Family Home Lives with:: Self Patient language and need for interpreter reviewed:: Yes Do you feel safe going back to the place where you live?: Yes      Need for Family Participation in Patient Care: Yes (Comment) Care giver support system in place?: Yes (comment)   Criminal Activity/Legal Involvement Pertinent to Current Situation/Hospitalization: No - Comment as needed  Activities of Daily Living      Permission Sought/Granted Permission sought to share information with : Case Manager Permission granted to share information with : Yes, Verbal Permission Granted              Emotional Assessment Appearance:: Appears stated age   Affect (typically observed): Accepting, Appropriate Orientation: : Oriented to Place, Oriented to  Time, Oriented to Situation, Oriented to Self Alcohol / Substance Use: Not Applicable Psych  Involvement: No (comment)  Admission diagnosis:  Stroke (cerebrum) [facility]) [I63.9] Patient Active Problem List   Diagnosis Date Noted  . AKI (acute kidney injury) ([place])   . Hepatic cirrhosis ([place])   . SDH (subdural hematoma) ([place])   . Dyslipidemia   . Tobacco abuse   . Acute blood loss anemia   . Acute respiratory failure ([place])   . Endotracheally intubated 03/15/2019  . 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 Bacteriuria with pyuria 03/15/2019  . Bradycardia 03/15/2019  . CKD stage 3 due to type 2 diabetes mellitus ([place]) 03/15/2019  . Type 2 diabetes mellitus with complication, with long-term current use of insulin ([place]) 03/15/2019  . Microcytic anemia 03/15/2019  . Thrombocytopenia ([place]) 03/15/2019  . Compromised airway   . Stroke (cerebrum) ([place]) 03/14/2019  . Stenosis of left carotid artery 03/14/2019  . Ascites   . Hypomagnesemia   . Hypokalemia   . Liver cirrhosis secondary to NASH ([place])   . Hyperammonemia ([place]) 01/14/2019  . Subdural hematoma ([place]) 01/14/2019  . Type 2 diabetes mellitus with vascular disease ([place]) 01/14/2019  . Acute hepatic encephalopathy 01/14/2019  . Acute metabolic encephalopathy 54/00/8676  . Hyperlipidemia 07/22/2017  . Hypertension 07/22/2017  . Obstructive sleep apnea treated with continuous positive airway pressure (CPAP) 07/22/2017  . Acute CVA (cerebrovascular accident) ([place]) 12/15/2016  . TIA (transient ischemic attack) 12/14/2016  . GERD (gastroesophageal reflux disease) 12/14/2016  . Insulin dependent diabetes  mellitus ([place]) 12/14/2016  . No blood products 12/14/2016   PCP:  Jenel Lucks, PA-C Pharmacy:   [facility] [address] [place], [place] - [address] [place] [address] Phone: [phone] Fax: [place], [place] [address] [place] [address] Phone: [phone] Fax: [phone]          Readmission Risk Interventions No flowsheet data found.   Reinaldo Raddle, RN, BSN  Trauma/Neuro ICU Case Manager [phone]

## 2019-03-16 NOTE — PMR Pre-admission (Signed)
PMR Admission Coordinator Pre-Admission Assessment  Patient: Monica Pearson is an 73 y.o., female MRN: [id number] DOB: 1946/06/09 Height: _0  (160 cm) Weight: 113 kg              Insurance Information HMO:     PPO: Yes     PCP:      IPA:      80/20:      OTHER:  PRIMARY: U[employer] Medicare      Policy#: [id number]      Subscriber: patient CM Name: Myra      Phone#: [phone] ext [phone]     Fax#:  Pre-Cert#: [id number]      Employer:  [employer] provided by Myra on 4/23 for admit to CIR. Pt is approved for 3 days (4/23-4/25). Due to the 4/25 being Saturday, clinical updates can be sent 4/27 (Monday) to follow up CM: Dorthula Nettles (p): [phone] (f): [phone] Benefits:  Phone #: NA     Name: online - [email or web address] Eff. Date: 12/26/2018 still active     Deduct: NA      Out of Pocket Max: $3,900 (met $1,770.77)      Life Max: NA CIR: $345/day co-pay for days 1-5, $0/day co-pay for days 6+      SNF: $0/day for days 1-20, $160/day for days 21-45, $0/day for days 46-100; limited to 100 days/cal yr Outpatient: limited by medical necessity     Co-Pay: $40/visit Home Health: 100%, limited by medical necessity      Co-Pay: 0% DME: 80%     Co-Pay: 20% Providers:  SECONDARY:       Policy#:       Subscriber:  CM Name:       Phone#:      Fax#:  Pre-Cert#:       Employer:  Benefits:  Phone #:     Name: Eff. Date:      Deduct:       Out of Pocket Max:       Life Max:  CIR:      SNF:  Outpatient:      Co-Pay:  Home Health:      Co-Pay:  DME:      Co-Pay:   Medicaid Application Date:       Case Manager:  Disability Application Date:       Case Worker:   The "Data Collection Information Summary" for patients in Inpatient Rehabilitation Facilities with attached "Privacy Act [place] Records" was provided and verbally reviewed with: Patient  Emergency Contact Information Contact Information    Name Relation Home Work [place] Son [phone]  [phone]   Jannelly, Bergren Daughter    [phone]     Current Medical History  Patient Admitting Diagnosis: Left MCA infarct History of Present Illness: Monica Pearson is a 73 year old Jehovah witness female with history of liver cirrhosis,CKD stage II, 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 The pt presented 03/14/2019 with right-sided weakness and slurred speech. Cranial CT scan reviewed by neurology suspect left MCA infarction. Minimal right convexity subdural blood without acute hemorrhage. Patient did not receive TPA. Echocardiogram with ejection fraction of 65%, hyperdynamic systolic function. Lower extremity Dopplers negative for DVT. 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 Patient underwent left common carotid arteriogram followed by complete revascularization of symptomatic severe stenosis left ICA per interventional radiology on 03/14/2019. Noted acute respiratory failure  following revascularization procedure required intubation until 03/15/2019. Patient was initially maintained on pressors for low blood pressure. Follow-up neurology services no anti-thrombotic at this time given recent SDH and mildly elevated INR 1.9 with thrombocytopenia. Venous Dopplers lower extremities negative. Urine study grade 100,000 Klebsiella, 50,000 enterococcus completing a course of Zosyn .Her diet has been advanced to mechanical soft. Therapy evaluations completed with recommendation for CIR. Patient is to be admitted for a comprehensive rehabilitation program on 03/18/19.  Complete NIHSS TOTAL: 3 Glasgow Coma Scale Score: 15  Past Medical History  Past Medical History:  Diagnosis Date  . Cirrhosis ([place])   . Diabetes mellitus without complication ([place])   . Diabetic retinopathy ([place])   . Diabetic retinopathy ([place])   . GERD (gastroesophageal reflux disease)   . High cholesterol   . Hypertension    . Patient is Jehovah's Witness   . Sarcoidosis   . Stroke ([place])   . Subdural hematoma ([facility])     Family History  family history includes Bladder Cancer in her mother; CAD in her father; Prostate cancer in her father; Stroke in her mother.  Prior Rehab/Hospitalizations:  Has the patient had prior rehab or hospitalizations prior to admission? Yes  Has the patient had major surgery during 100 days prior to admission? Yes  Current Medications   Current Facility-Administered Medications:  .  0.9 %  sodium chloride infusion, , Intravenous, PRN, Rosalin Hawking, MD, Last Rate: 40 mL/hr at 03/17/19 2029 .  acetaminophen (TYLENOL) tablet 650 mg, 650 mg, Oral, Q4H PRN **OR** acetaminophen (TYLENOL) solution 650 mg, 650 mg, Per Tube, Q4H PRN **OR** acetaminophen (TYLENOL) suppository 650 mg, 650 mg, Rectal, Q4H PRN, Deveshwar, Sanjeev, MD .  amoxicillin-clavulanate (AUGMENTIN) 500-125 MG per tablet 500 mg, 1 tablet, Oral, BID, Elodia Florence., MD, 500 mg at 03/18/19 1119 .  chlorhexidine gluconate (MEDLINE KIT) (PERIDEX) 0.12 % solution 15 mL, 15 mL, Mouth Rinse, BID, Kerney Elbe, MD, 15 mL at 03/17/19 0826 .  Chlorhexidine Gluconate Cloth 2 % PADS 6 each, 6 each, Topical, Daily, Ollis, Brandi L, NP, 6 each at 03/17/19 1706 .  furosemide (LASIX) tablet 40 mg, 40 mg, Oral, Daily, Tat, David, MD, 40 mg at 03/18/19 1119 .  insulin aspart (novoLOG) injection 0-15 Units, 0-15 Units, Subcutaneous, TID WC & HS, Rosalin Hawking, MD, 2 Units at 03/17/19 2240 .  insulin aspart (novoLOG) injection 5 Units, 5 Units, Subcutaneous, TID WC, Rosalin Hawking, MD, 5 Units at 03/18/19 [phone] .  lactulose (CHRONULAC) 10 GM/15ML solution 10 g, 10 g, Oral, BID, Kerney Elbe, MD, 10 g at 03/18/19 1119 .  magnesium oxide (MAG-OX) tablet 400 mg, 400 mg, Oral, QHS, Kerney Elbe, MD, 400 mg at 03/17/19 2238 .  MEDLINE mouth rinse, 15 mL, Mouth Rinse, q12n4p, Ollis, Brandi L, NP, 15 mL at 03/17/19 1659 .  norepinephrine  (LEVOPHED) 44m in 2544mpremix infusion, 2-16 mcg/min, Intravenous, Titrated, Ollis, Brandi L, NP, Stopped at 03/17/19 1116 .  pantoprazole (PROTONIX) EC tablet 40 mg, 40 mg, Oral, Q1200, SoChesley MiresMD, 40 mg at 03/18/19 1119 .  senna-docusate (Senokot-S) tablet 1 tablet, 1 tablet, Oral, QHS PRN, LiKerney ElbeMD .  sodium chloride flush (NS) 0.9 % injection 10-40 mL, 10-40 mL, Intracatheter, Q12H, MaGeorgette ShellMD, 10 mL at 03/17/19 2235 .  sodium chloride flush (NS) 0.9 % injection 10-40 mL, 10-40 mL, Intracatheter, PRN, MaGeorgette ShellMD .  spironolactone (ALDACTONE) tablet 12.5 mg, 12.5 mg, Oral, Daily, Tat, David, MD, 12.5 mg  at 03/18/19 1118  Patients Current Diet:  Diet Order            Diet Carb Modified Fluid consistency: Thin; Room service appropriate? 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 Indepedent PTA  Prior Functional Level Prior Function Level of Independence: Independent with assistive device(s) Comments: Uses RW for ambulation; drives. Crochets at home  Self Care: Did the patient need help bathing, dressing, using the toilet or eating?  Independent  Indoor Mobility: Did the patient need assistance with walking from room to room (with or without device)? Independent  Stairs: Did the patient need assistance with internal or external stairs (with or without device)? Independent  Functional Cognition: Did the patient need help planning regular tasks such as shopping or remembering to take medications? Needed some help, friend filled pill box  [place] Devices/Equipment: Cane (specify quad or straight),  Walker (specify type) Home Equipment: Walker - 2 wheels, Shower seat  Prior Device Use: Indicate devices/aids used by the patient prior to current illness, exacerbation or injury? Walker  Current Functional Level Cognition  Arousal/Alertness: Awake/alert Overall Cognitive Status: Impaired/Different from baseline Current Attention Level: Selective Orientation Level: Oriented X4 Following Commands: Follows one step commands with increased time, Follows one step commands consistently Safety/Judgement: Decreased awareness of deficits General Comments: Pt motivated to participate in therapy. Continues to present decreased processing and awareness.  Attention: Sustained Sustained Attention: Impaired Sustained Attention Impairment: Verbal complex Memory: Impaired Memory Impairment: Retrieval deficit Awareness: (questionable) Problem Solving: (suspects difficulty with high level) Safety/Judgment: Impaired    Extremity Assessment (includes Sensation/Coordination)  Upper Extremity Assessment: RUE deficits/detail RUE Deficits / Details: Decreased strength and cooridnation. Using during ADLs.  RUE Coordination: decreased fine motor, decreased gross motor  Lower Extremity Assessment: Defer to PT evaluation RLE Deficits / Details: Grossly ~3/5 throughout; swelling noted. RLE Sensation: WNL LLE Deficits / Details: Grossly ~3+/5 throughout, pitting edema present and swelling in LEs- tender to palpate LLE Sensation: WNL    ADLs  Overall ADL's : Needs assistance/impaired Eating/Feeding: NPO Grooming: Wash/dry hands, Brushing hair, Wash/dry face, Minimal assistance, Min guard, Standing Grooming Details (indicate cue type and reason): Pt performing grooming at sink with Min Guard-Min A for balance and safety.  Upper Body Bathing: Maximal assistance, Sitting Lower Body Bathing: Maximal assistance, Sit to/from stand Upper Body Dressing : Moderate assistance, Sitting Lower Body Dressing: Maximal  assistance, +2 for safety/equipment, Sit to/from stand Lower Body Dressing Details (indicate cue type and reason): Max A for donning socks. Pt would benefit from further education on AE Toilet Transfer: Minimal assistance, BSC, Ambulation, RW Toilet Transfer Details (indicate cue type and reason): Min A for power up into standing.  Toileting- Clothing Manipulation and Hygiene: Maximal assistance, Sit to/from stand Toileting - Clothing Manipulation Details (indicate cue type and reason): Pt able to maintain standing. Pt requiring Max A for toilet hygiene.  Functional mobility during ADLs: +2 for safety/equipment, Minimal assistance, Rolling walker General ADL Comments: Pt limited by weakness, lethargy and decreased attention     Mobility  Overal bed mobility: Needs Assistance Bed Mobility: Rolling, Sidelying to Sit Rolling: Min guard Sidelying  to sit: Min guard, HOB elevated Sit to sidelying: Mod assist General bed mobility comments: Min Guard A for safety. HOB elevated to assist with trunk support. Use of bed rail    Transfers  Overall transfer level: Needs assistance Equipment used: Rolling walker (2 wheeled) Transfers: Sit to/from Stand Sit to Stand: +2 physical assistance, Min assist, +2 safety/equipment General transfer comment: Min A to power up into standing. cues for hand placement and weight shift forward    Ambulation / Gait / Stairs / Wheelchair Mobility  Ambulation/Gait Ambulation/Gait assistance: Min assist, +2 safety/equipment Gait Distance (Feet): 20 Feet Assistive device: Rolling walker (2 wheeled) Gait Pattern/deviations: Step-through pattern, Decreased stride length, Decreased step length - right General Gait Details: Slow, mildly unsteady gait with cues for sequencing and direction changes. Decreased step length RLE Gait velocity: decreased    Posture / Balance Balance Overall balance assessment: Needs assistance Sitting-balance support: Feet supported, Single  extremity supported Sitting balance-Leahy Scale: Fair Standing balance support: During functional activity, Single extremity supported Standing balance-Leahy Scale: Poor Standing balance comment: Able to stand with 1 UE support in standing. total A for pericare.    Special needs/care consideration BiPAP/CPAP: yes CPAP at home CPM: no Continuous Drip IV: 0.9% sodium chloride infusion  Dialysis: no        Days: no Life Vest; no Oxygen: no Special Bed: no Trach Size: no Wound Vac (area): no      Location: no Skin: abrasion to abdomen, legs, chest; ecchymosis to Bilateral arms. Venous stasis ulcer to right calf.                   Bowel mgmt:chronic incontinence, last BM charted on 4/22. Bladder mgmt: external catheter in place Diabetic mgmt: no Behavioral consideration : no Chemo/radiation : no     Previous Home Environment (from acute therapy documentation) Living Arrangements: Alone  Lives With: Alone Available Help at Discharge: Family, Available PRN/intermittently Type of Home: House Home Layout: One level Home Access: Stairs to enter Entrance Stairs-Rails: Right Entrance Stairs-Number of Steps: 1 in front vs 5 in back Bathroom Shower/Tub: Multimedia programmer: [place]: Yes Type of Home Care Services: Home RN, [place] (if known): [facility]  Discharge Living Setting Plans for Discharge Living Setting: Patient's home, Alone Type of Home at Discharge: House Discharge Home Layout: One level Discharge Home Access: Stairs to enter Entrance Stairs-Rails: Right, None(right rail in front entrance; no rails at back entrance) Entrance Stairs-Number of Steps: 8 in front; 2 in back Discharge Bathroom Shower/Tub: Tub/shower unit Discharge Bathroom Toilet: Handicapped height Discharge Bathroom Accessibility: Yes(tight for RW, will need to side step) How Accessible: Accessible via walker(possibly if sidestep with RW) Does the  patient have any problems obtaining your medications?: No  Social/Family/Support Systems Patient Roles: Other (Comment)(separated from husband; has son nearby) Contact Information: son: Dorita Fray: [phone]; daugther in law Murlean Iba): [phone] Anticipated Caregiver: son and daughter in law as well as friends Diane [phone]) and Lelan Pons [phone]) who can check in daily (confirmed willingness to check in with Shauna Hugh and Lelan Pons). Anticipated Caregiver's Contact Information: see above Ability/Limitations of Caregiver: Supervision Caregiver Availability: Other (Comment)(son says mornigs vs evenings he can stop by; friends can call and stop by 1-2x/day Discharge Plan Discussed with Primary Caregiver: Yes(pt and son) Is Caregiver In Agreement with Plan?: Yes Does Caregiver/Family have Issues with Lodging/Transportation while Pt is in Rehab?: No   Goals/Additional Needs Patient/Family Goal for Rehab: PT/OT: Mod I/Supervision;  SLP: Mod I Expected length of stay: 10-15 days Cultural Considerations: Jehovah's witness Dietary Needs: carb modified, thin liquids; calorie level: medium 1600-2000 Equipment Needs: TBD Pt/Family Agrees to Admission and willing to participate: Yes Program Orientation Provided & Reviewed with Pt/Caregiver Including Roles  & Responsibilities: Yes(pt and son)  Barriers to Discharge: Home environment access/layout, Lack of/limited family support  Barriers to Discharge Comments: 8 steps to enter in front (2 in back); tub shower; limited caregiver support.    Decrease burden of Care through IP rehab admission: NA   Possible need for SNF placement upon discharge: Not anticipated; Pt has support from family and friends who can provide intermittent supervision and a good prognosis for further progress through CIR.     Patient Condition: This patient's medical and functional status has changed since the consult dated: 4/21 in which the Rehabilitation Physician determined  and documented that the patient's condition is appropriate for intensive rehabilitative care in an inpatient rehabilitation facility. See "History of Present Illness" (above) for medical update. Functional changes are: improvement in transfers from mod A to min A x2, and improvement in ambulation assist and distance, from Mod A x2 for 5 feet to Min A x2 for 20 feet. Patient's medical and functional status update has been discussed with the Rehabilitation physician and patient remains appropriate for inpatient rehabilitation. Will admit to inpatient rehab today.  Preadmission Screen Completed By:  Jhonnie Garner, OT, 03/18/2019 12:39 PM ______________________________________________________________________   Discussed status with Dr. Letta Pate on 03/18/19 at 12:34PM and received approval for admission today.  Admission Coordinator:  Jhonnie Garner, time 12:34PM/Date 03/18/19

## 2019-03-16 NOTE — Progress Notes (Addendum)
PROGRESS NOTE    Monica Pearson  LOV:[id number] DOB: 1946/05/13 DOA: 03/14/2019 PCP: Jenel Lucks, PA-C   Brief Narrative:  Per HPI: [phone] y.o.female reformed smoker presented to the [employer] emergency department via [facility] with complaints of slurred speech, right-sided weakness and leftward gaze preference. Her symptoms reportedly resolved in route to the emergency department; however, in the emergency department, she developed recurring symptoms along with right-sided facial droop. Code STROKE was called. CT of the head was negative; however, CTA of the neck revealed left internal carotid artery occlusion. The patient is seen in 4 and 23 after undergoing mechanical thrombectomy. She has returned from the interventional radiology suite on mechanical ventilatory support. We are consulted for assistance with ventilator management.  4/21: Patient was extubated by PCCM on 4/20. She is doing well on room air, but failed swallow study. Plans are for speech to evaluate today. She continues to remain on pressors, but this is being weaned. PICC line today for better access. UTI with GNR for which she remains on Zosyn today. Will need to go to CIR on DC.    Assessment & Plan:   Principal Problem:   Stroke (cerebrum) (HCC) Active Problems:   GERD (gastroesophageal reflux disease)   Stenosis of left carotid artery   Endotracheally intubated   Sarcoidosis of lung (HCC)   Bacteriuria with pyuria   Bradycardia   CKD stage 3 due to type 2 diabetes mellitus ([place])   Type 2 diabetes mellitus with complication, with long-term current use of insulin (HCC)   Microcytic anemia   Thrombocytopenia (HCC)   Compromised airway   1. 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  Patient has prior history of subdural hematoma.  Further plans per neurology. 2D Echo on 4/20 with LVEF>65% and now WMAs.  2. GNR UTI.  Follow cultures and maintain on Zosyn empirically for now.  Vanc DC on 4/20. 3. AKI on CKD stage II-stable.  Baseline creatinine appears to be 0.5-0.8 looking back almost 1 year.  Will avoid nephrotoxic agents and monitor urine output which has been stable.  Patient is nonoliguric.  Avoid diuretics at this time given low blood pressure readings.Will avoid nephrotoxic agents and start time-limited LR. Repeat am labs. 4. Microcytic anemia with thrombocytopenia-downtrending.  Iron studies normal and HIT panel pending.  No overt bleeding identified.  Continue to monitor repeat CBC. Likely due to shock and should resolve slowly as critical illness improves. Patient is Jehovah's witness and refuses and blood transfusions if needed. 5. Nash cirrhosis with ascites.  She does not currently have tense ascites.  She is noted to be hypoalbuminemic in the setting of hypotension.  Continue to monitor PT/INR as well as ammonia levels. 4 doses of albumin given on 4/20. Plan to restart home spironolactone and lasix once off pressors. 6. History of sarcoidosis-status post extubation 4/20. Stable and on RA now.  7. Type 2 diabetes with complications along with morbid obesity.  Continue SSI.  Blood glucose currently stable. 8. GERD.  PPI.   DVT prophylaxis:SCDs Code Status: Full Family Communication: None at bedside Disposition Plan: Continue on Zosyn empirically until further culture data return.  Maintain on LR (time-limited) for now until stable diet.  Continue close monitoring in ICU and transfer to progressive or tele once pressors weaned. Holding home diuretics for now.  Further plans per neurology who is primary.    Other Consultants:   PCCM  Procedures:   None  Antimicrobials:   Vancomycin 4/19-4/20  Zosyn 4/19->   Subjective: Patient seen and evaluated today with no new acute complaints or concerns. No acute concerns or events noted overnight. She is doing well after extubation yesterday, but has failed her swallow evaluation and is hungry this am.  She has come off of her Bair hugger and temp is now stable.  Objective: Vitals:   03/16/19 0300 03/16/19 0400 03/16/19 0500 03/16/19 0600  BP: 106/65 (!) 112/49 (!) 94/53 (!) 110/47  Pulse: 74 67 66 66  Resp: 16 15 12 15   Temp:  97.6 F (36.4 C)    TempSrc:  Oral    SpO2: 96% 96% 96% 97%  Weight:      Height:        Intake/Output Summary (Last 24 hours) at 03/16/2019 0639 Last data filed at 03/16/2019 0600 Gross per 24 hour  Intake 3263.52 ml  Output 1675 ml  Net 1588.52 ml   Filed Weights   03/14/19 1900 03/14/19 2014  Weight: 106.2 kg 106.2 kg    Examination:  General exam: Appears calm and comfortable  Respiratory system: Clear to auscultation. Respiratory effort normal. On RA. Cardiovascular system: S1 & S2 heard, RRR. No JVD, murmurs, rubs, gallops or clicks. No pedal edema. Gastrointestinal system: Abdomen is minimally distended, soft and nontender. No organomegaly or masses felt. Normal bowel sounds heard. Central nervous system: Alert and oriented. Extremities: SCDs on LE bilaterally. Skin: No rashes, lesions or ulcers Psychiatry: Judgement and insight appear normal. Mood & affect appropriate.     Data Reviewed: I have personally reviewed following labs and imaging studies  CBC: Recent Labs  Lab 03/14/19 1940 03/15/19 0354 03/16/19 0426  WBC 8.9 7.4 8.9  NEUTROABS 7.2 5.3  --   HGB 11.0* 9.5* 8.9*  HCT 30.0* Unable to determine due to a cold agglutinin 24.4*  MCV 69.6* Unable to determine due to a cold agglutinin 69.3*  PLT 97* 83* 76*   Basic Metabolic Panel: Recent Labs  Lab 03/14/19 1940 03/14/19 1941 03/15/19 0354 03/16/19 0426  NA 133*  --  134* 141  K 4.7  --  3.5 3.9  CL 98  --  103 108  CO2 26  --  22 18*  GLUCOSE 172*  --  106* 89  BUN 81*  --  76* 70*  CREATININE 1.78* 1.70* 1.72* 1.76*  CALCIUM 8.3*  --  8.1* 8.5*   GFR: Estimated Creatinine Clearance: 33.2 mL/min (A) (by C-G formula based on SCr of 1.76 mg/dL (H)). Liver  Function Tests: Recent Labs  Lab 03/14/19 1940 03/16/19 0426  AST 104* 92*  ALT 102* 87*  ALKPHOS 225* 153*  BILITOT 2.6* 4.8*  PROT 5.8* 5.6*  ALBUMIN 2.1* 2.6*   No results for input(s): LIPASE, AMYLASE in the last 168 hours. Recent Labs  Lab 03/14/19 1941 03/16/19 0426  AMMONIA 55* 40*   Coagulation Profile: Recent Labs  Lab 03/14/19 1940 03/16/19 0426  INR 1.6* 1.9*   Cardiac Enzymes: No results for input(s): CKTOTAL, CKMB, CKMBINDEX, TROPONINI in the last 168 hours. BNP (last 3 results) No results for input(s): PROBNP in the last 8760 hours. HbA1C: Recent Labs    03/15/19 0354  HGBA1C 6.2*   CBG: Recent Labs  Lab 03/15/19 1254 03/15/19 1700 03/15/19 1953 03/15/19 2338 03/16/19 0331  GLUCAP 73 86 77 81 91   Lipid Profile: Recent Labs    03/15/19 0354  CHOL 165  HDL 40*  LDLCALC 115*  TRIG 51   51  CHOLHDL 4.1  Thyroid Function Tests: No results for input(s): TSH, T4TOTAL, FREET4, T3FREE, THYROIDAB in the last 72 hours. Anemia Panel: Recent Labs    03/15/19 1100  FERRITIN 236  TIBC 200*  IRON 96   Sepsis Labs: No results for input(s): PROCALCITON, LATICACIDVEN in the last 168 hours.  Recent Results (from the past 240 hour(s))  Urine Culture     Status: Abnormal (Preliminary result)   Collection Time: 03/15/19  3:54 AM  Result Value Ref Range Status   Specimen Description URINE, CATHETERIZED  Final   Special Requests Normal  Final   Culture (A)  Final    >=100,000 COLONIES/mL GRAM NEGATIVE RODS IDENTIFICATION AND SUSCEPTIBILITIES TO FOLLOW Performed at [facility], [address], [place], [place] [address]    Report Status PENDING  Incomplete  MRSA PCR Screening     Status: None   Collection Time: 03/15/19  3:54 AM  Result Value Ref Range Status   MRSA by PCR NEGATIVE NEGATIVE Final    Comment:        The GeneXpert MRSA Assay (FDA approved for NASAL specimens only), is one component of a comprehensive MRSA  colonization surveillance program. It is not intended to diagnose MRSA infection nor to guide or monitor treatment for MRSA infections. Performed at [facility], [place] [address], [place], [place] [address]   Culture, respiratory     Status: None (Preliminary result)   Collection Time: 03/15/19 11:18 AM  Result Value Ref Range Status   Specimen Description TRACHEAL ASPIRATE  Final   Special Requests Normal  Final   Gram Stain   Final    ABUNDANT WBC PRESENT,BOTH PMN AND MONONUCLEAR NO ORGANISMS SEEN Performed at [facility], [address] [place],  [address]    Culture PENDING  Incomplete   Report Status PENDING  Incomplete         Radiology Studies: Ct Angio Head W Or Wo Contrast  Result Date: 03/14/2019 CLINICAL DATA:  Right-sided deficits EXAM: CT ANGIOGRAPHY HEAD AND NECK TECHNIQUE: Multidetector CT imaging of the head and neck was performed using the standard protocol during bolus administration of intravenous contrast. Multiplanar CT image reconstructions and MIPs were obtained to evaluate the vascular anatomy. Carotid stenosis measurements (when applicable) are obtained utilizing NASCET criteria, using the distal internal carotid diameter as the denominator. CONTRAST:  66m OMNIPAQUE IOHEXOL 350 MG/ML SOLN COMPARISON:  Head CT 03/14/2019 FINDINGS: CTA NECK FINDINGS SKELETON: There is no bony spinal canal stenosis. No lytic or blastic lesion. OTHER NECK: Normal pharynx, larynx and major salivary glands. No cervical lymphadenopathy. Unremarkable thyroid gland. UPPER CHEST: No pneumothorax or pleural effusion. No nodules or masses. AORTIC ARCH: There is no calcific atherosclerosis of the aortic arch. There is no aneurysm, dissection or hemodynamically significant stenosis of the visualized ascending aorta and aortic arch. Conventional 3 vessel aortic branching pattern. The visualized proximal subclavian arteries are widely patent. RIGHT CAROTID SYSTEM: --Common  carotid artery: Widely patent origin without common carotid artery dissection or aneurysm. --Internal carotid artery: Normal without aneurysm, dissection or stenosis. --External carotid artery: No acute abnormality. LEFT CAROTID SYSTEM: --Common carotid artery: Widely patent origin without common carotid artery dissection or aneurysm. --Internal carotid artery: Qualitative narrowing of the proximal ICA, which otherwise remains patent to the skull base. There is low-attenuation thrombus within the proximal left ICA causing severe --External carotid artery: No acute abnormality. VERTEBRAL ARTERIES: Left dominant configuration. Both origins are normal. No dissection, occlusion or flow-limiting stenosis to the  vertebrobasilar confluence. CTA HEAD FINDINGS POSTERIOR CIRCULATION: --Vertebral arteries: Normal codominant configuration of V4 segments. --Posterior inferior cerebellar arteries (PICA): Patent origins from the vertebral arteries. --Anterior inferior cerebellar arteries (AICA): The right AICA originates normally from the basilar artery. Left AICA not clearly visualized. --Basilar artery: Normal. --Superior cerebellar arteries: Normal. --Posterior cerebral arteries (PCA): Normal. Both originate from the basilar artery. Posterior communicating arteries (p-comm) are diminutive . ANTERIOR CIRCULATION: --Intracranial internal carotid arteries: Normal. --Anterior cerebral arteries (ACA): Normal. Both A1 segments are present. Patent anterior communicating artery (a-comm). --Middle cerebral arteries (MCA): Normal. VENOUS SINUSES: As permitted by contrast timing, patent. ANATOMIC VARIANTS: None Review of the MIP images confirms the above findings. IMPRESSION: 1. 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 This causes severe qualitative narrowing of proximal ICA. This be a source for small downstream emboli. 2. No intracranial occlusion or stenosis. 3. These  results were called by telephone at the time of interpretation on 03/14/2019 at 8:23 pm to Dr. Kerney Elbe , who verbally acknowledged these results. Electronically Signed   By: Ulyses Jarred M.D.   On: 03/14/2019 20:23   Ct Angio Neck W Or Wo Contrast  Result Date: 03/14/2019 CLINICAL DATA:  Right-sided deficits EXAM: CT ANGIOGRAPHY HEAD AND NECK TECHNIQUE: Multidetector CT imaging of the head and neck was performed using the standard protocol during bolus administration of intravenous contrast. Multiplanar CT image reconstructions and MIPs were obtained to evaluate the vascular anatomy. Carotid stenosis measurements (when applicable) are obtained utilizing NASCET criteria, using the distal internal carotid diameter as the denominator. CONTRAST:  64m OMNIPAQUE IOHEXOL 350 MG/ML SOLN COMPARISON:  Head CT 03/14/2019 FINDINGS: CTA NECK FINDINGS SKELETON: There is no bony spinal canal stenosis. No lytic or blastic lesion. OTHER NECK: Normal pharynx, larynx and major salivary glands. No cervical lymphadenopathy. Unremarkable thyroid gland. UPPER CHEST: No pneumothorax or pleural effusion. No nodules or masses. AORTIC ARCH: There is no calcific atherosclerosis of the aortic arch. There is no aneurysm, dissection or hemodynamically significant stenosis of the visualized ascending aorta and aortic arch. Conventional 3 vessel aortic branching pattern. The visualized proximal subclavian arteries are widely patent. RIGHT CAROTID SYSTEM: --Common carotid artery: Widely patent origin without common carotid artery dissection or aneurysm. --Internal carotid artery: Normal without aneurysm, dissection or stenosis. --External carotid artery: No acute abnormality. LEFT CAROTID SYSTEM: --Common carotid artery: Widely patent origin without common carotid artery dissection or aneurysm. --Internal carotid artery: Qualitative narrowing of the proximal ICA, which otherwise remains patent to the skull base. There is low-attenuation  thrombus within the proximal left ICA causing severe --External carotid artery: No acute abnormality. VERTEBRAL ARTERIES: Left dominant configuration. Both origins are normal. No dissection, occlusion or flow-limiting stenosis to the vertebrobasilar confluence. CTA HEAD FINDINGS POSTERIOR CIRCULATION: --Vertebral arteries: Normal codominant configuration of V4 segments. --Posterior inferior cerebellar arteries (PICA): Patent origins from the vertebral arteries. --Anterior inferior cerebellar arteries (AICA): The right AICA originates normally from the basilar artery. Left AICA not clearly visualized. --Basilar artery: Normal. --Superior cerebellar arteries: Normal. --Posterior cerebral arteries (PCA): Normal. Both originate from the basilar artery. Posterior communicating arteries (p-comm) are diminutive . ANTERIOR CIRCULATION: --Intracranial internal carotid arteries: Normal. --Anterior cerebral arteries (ACA): Normal. Both A1 segments are present. Patent anterior communicating artery (a-comm). --Middle cerebral arteries (MCA): Normal. VENOUS SINUSES: As permitted by contrast timing, patent. ANATOMIC VARIANTS: None Review of the MIP images confirms the above findings. IMPRESSION: 1. Nonocclusive thrombus within the left internal carotid artery  at its origin length of approximately 12 mm. This causes severe qualitative narrowing of proximal ICA. This be a source for small downstream emboli. 2. No intracranial occlusion or stenosis. 3. These results were called by telephone at the time of interpretation on 03/14/2019 at 8:23 pm to Dr. Kerney Elbe , who verbally acknowledged these results. Electronically Signed   By: Ulyses Jarred M.D.   On: 03/14/2019 20:23   Mr Brain Wo Contrast  Result Date: 03/15/2019 CLINICAL DATA:  Slurred speech with RIGHT-sided weakness and RIGHT gaze. Status post clot retrieval from the LEFT ICA. EXAM: MRI HEAD WITHOUT CONTRAST TECHNIQUE: Multiplanar, multiecho pulse sequences of the  brain and surrounding structures were obtained without intravenous contrast. COMPARISON:  CTA head neck 03/14/2019. Catheter angiogram 03/14/2019. FINDINGS: The patient was unable to remain motionless for the exam. Small or subtle lesions could be overlooked. Brain: Small foci of acute infarction are seen in the LEFT hemisphere, MCA territory affecting the LEFT frontal and LEFT posterior frontal cortex and regional white matter. The largest area is located in the precentral gyrus, roughly 1 cm in size. No visible associated hemorrhage. Additional areas of restricted diffusion in the RIGHT subdural space, most notably over the frontal lobe, represent the previously reported subdural hematoma. No significant mass effect from these collections, which measures only a few mm in thickness, maximal 5 mm over the RIGHT inferior frontal lobe. Elsewhere, generalized atrophy. Mild to moderate subcortical and periventricular T2 and FLAIR hyperintensities, likely chronic microvascular ischemic change. Vascular: Normal flow voids. Skull and upper cervical spine: Motion degraded, grossly negative. Sinuses/Orbits: Negative. Other: None. IMPRESSION: Small foci of acute infarction, LEFT MCA territory affecting the frontal and posterior frontal cortex and white matter. See discussion above. No associated hemorrhage. Small volume subdural hematoma over the RIGHT convexity, non worrisome. Atrophy and small vessel disease. Flow voids demonstrate patency of the LEFT carotid system status post clot retrieval. Electronically Signed   By: Staci Righter M.D.   On: 03/15/2019 17:08   Dg Chest Port 1 View  Result Date: 03/15/2019 CLINICAL DATA:  Check endotracheal tube placement EXAM: PORTABLE CHEST 1 VIEW COMPARISON:  03/14/2019 FINDINGS: Cardiac shadow is within normal limits. Endotracheal tube and nasogastric catheter are noted in satisfactory position. The overall inspiratory effort is poor with right basilar atelectasis. No bony  abnormality is noted. IMPRESSION: Right basilar atelectasis is seen. Tubes and lines as described. Electronically Signed   By: Inez Catalina M.D.   On: 03/15/2019 08:06   Dg Chest Portable 1 View  Result Date: 03/14/2019 CLINICAL DATA:  Intubation, OG tube placement EXAM: PORTABLE CHEST 1 VIEW COMPARISON:  03/02/2019 FINDINGS: Endotracheal tube is just into the right mainstem bronchus. Recommend retracting approximately 2-3 cm. NG tube tip is in the distal esophagus. Low lung volumes with bibasilar atelectasis. No effusions or acute bony abnormality. IMPRESSION: Right mainstem intubation. Recommend retracting 2-3 cm. NG tube tip in the distal esophagus. Low lung volumes with bibasilar atelectasis. These results were called by telephone at the time of interpretation on 03/14/2019 at 9:19 pm to Dr. Noemi Chapel , who verbally acknowledged these results. Electronically Signed   By: Rolm Baptise M.D.   On: 03/14/2019 21:22   Dg Abd Portable 1 View  Result Date: 03/14/2019 CLINICAL DATA:  OG tube EXAM: PORTABLE ABDOMEN - 1 VIEW COMPARISON:  Chest x-ray today FINDINGS: OG tube tip is in the distal esophagus, better seen on chest x-ray. Nonobstructive bowel gas pattern. Prior cholecystectomy. IMPRESSION: OG tube tip in  the distal esophagus, better seen on chest x-ray. Recommend advancing. Electronically Signed   By: Rolm Baptise M.D.   On: 03/14/2019 21:22   Ct Head Code Stroke Wo Contrast  Result Date: 03/14/2019 CLINICAL DATA:  Code stroke. Slurred speech and right-sided deficits EXAM: CT HEAD WITHOUT CONTRAST TECHNIQUE: Contiguous axial images were obtained from the base of the skull through the vertex without intravenous contrast. COMPARISON:  Head CT 02/11/2019 FINDINGS: Brain: Minimal residual right convexity subdural blood, unchanged compared to 02/11/2019. No acute hemorrhage. The size and configuration of the ventricles and extra-axial CSF spaces are normal. There is hypoattenuation of the  periventricular white matter, most commonly indicating chronic ischemic microangiopathy. Vascular: No abnormal hyperdensity of the major intracranial arteries or dural venous sinuses. No intracranial atherosclerosis. Skull: The visualized skull base, calvarium and extracranial soft tissues are normal. Sinuses/Orbits: No fluid levels or advanced mucosal thickening of the visualized paranasal sinuses. No mastoid or middle ear effusion. The orbits are normal. ASPECTS [facility] Stroke Program Early CT Score) - Ganglionic level infarction (caudate, lentiform nuclei, internal capsule, insula, M1-M3 cortex): 7 - Supraganglionic infarction (M4-M6 cortex): 3 Total score (0-10 with 10 being normal): 10 IMPRESSION: 1. Unchanged appearance of minimal residual right convexity subdural blood without acute hemorrhage. 2. ASPECTS is 10. 3. These results were communicated to Dr. Kerney Elbe at 7:50 pm on 03/14/2019 by text page via the [facility] messaging system. Electronically Signed   By: Ulyses Jarred M.D.   On: 03/14/2019 19:51   Vas [place] Lower Extremity Venous (dvt)  Result Date: 03/15/2019  Lower Venous Study Indications: Stroke, and Edema.  Limitations: Body habitus, poor ultrasound/tissue interface and poor patient cooperation; constant movement. Performing Technologist: Maudry Mayhew MHA, RDMS, RVT, RDCS  Examination Guidelines: A complete evaluation includes B-mode imaging, spectral Doppler, color Doppler, and power Doppler as needed of all accessible portions of each vessel. Bilateral testing is considered an integral part of a complete examination. Limited examinations for reoccurring indications may be performed as noted.  +---------+---------------+---------+-----------+----------+-------------------+  RIGHT     Compressibility Phasicity Spontaneity Properties Summary              +---------+---------------+---------+-----------+----------+-------------------+  CFV       Full                                                                   +---------+---------------+---------+-----------+----------+-------------------+  SFJ                                                        Not visualized       +---------+---------------+---------+-----------+----------+-------------------+  FV Prox   Full                                                                  +---------+---------------+---------+-----------+----------+-------------------+  FV Mid  patent, limited                                                                  visualization        +---------+---------------+---------+-----------+----------+-------------------+  FV Distal                                                  Not visualized       +---------+---------------+---------+-----------+----------+-------------------+  POP       Full                                                                  +---------+---------------+---------+-----------+----------+-------------------+  PTV                                                        patent, limited                                                                  visualization        +---------+---------------+---------+-----------+----------+-------------------+  PERO                                                       Not visualized       +---------+---------------+---------+-----------+----------+-------------------+   +---------+---------------+---------+-----------+----------+--------------+  LEFT      Compressibility Phasicity Spontaneity Properties Summary         +---------+---------------+---------+-----------+----------+--------------+  CFV                                                        Not visualized  +---------+---------------+---------+-----------+----------+--------------+  SFJ                                                        Not visualized  +---------+---------------+---------+-----------+----------+--------------+  FV Prox   Full                                                              +---------+---------------+---------+-----------+----------+--------------+  FV Mid                                                     Not visualized  +---------+---------------+---------+-----------+----------+--------------+  FV Distal                                                  Not visualized  +---------+---------------+---------+-----------+----------+--------------+  PFV                                                        Not visualized  +---------+---------------+---------+-----------+----------+--------------+  POP       Full            Yes       Yes                                    +---------+---------------+---------+-----------+----------+--------------+  PTV                                                        Not visualized  +---------+---------------+---------+-----------+----------+--------------+  PERO                                                       Not visualized  +---------+---------------+---------+-----------+----------+--------------+     Summary: Right: There is no evidence of deep vein thrombosis in the lower extremity. However, portions of this examination were limited- see technologist comments above. No cystic structure found in the popliteal fossa. Left: There is no evidence of deep vein thrombosis in the lower extremity. However, portions of this examination were limited- see technologist comments above. No cystic structure found in the popliteal fossa.  *See table(s) above for measurements and observations. Electronically signed by Ruta Hinds MD on 03/15/2019 at 6:41:43 PM.    Final    [place] Ekg Site Rite  Result Date: 03/16/2019 If Site Rite image not attached, placement could not be confirmed due to current cardiac rhythm.       Scheduled Meds:  chlorhexidine gluconate (MEDLINE KIT)  15 mL Mouth Rinse BID   insulin aspart  0-15 Units Subcutaneous Q4H   lactulose  10 g Oral BID   magnesium  oxide  400 mg Oral QHS   mouth rinse  15 mL Mouth Rinse 10 times per day   pantoprazole  40 mg Oral Q1200   sodium chloride flush  10-40 mL Intracatheter Q12H   Continuous Infusions:  sodium chloride 10 mL/hr at 03/16/19 0600   dextrose 5 % and 0.45% NaCl 50 mL/hr at 03/16/19 0600   lactated ringers     norepinephrine (LEVOPHED) Adult infusion 9 mcg/min (03/16/19 0600)   phenylephrine (  NEO-SYNEPHRINE) Adult infusion Stopped (03/15/19 1108)   piperacillin-tazobactam (ZOSYN)  IV 3.375 g (03/16/19 0046)     LOS: 2 days    Time spent: 67 minutes    Leilyn Frayre Darleen Crocker, DO [facility] Pager [phone]  If 7PM-7AM, please contact night-coverage [email or web address] Password [facility] 03/16/2019, 6:39 AM

## 2019-03-17 ENCOUNTER — Inpatient Hospital Stay (HOSPITAL_COMMUNITY): Payer: Medicare Other

## 2019-03-17 DIAGNOSIS — R188 Other ascites: Secondary | ICD-10-CM

## 2019-03-17 DIAGNOSIS — N183 Chronic kidney disease, stage 3 (moderate): Secondary | ICD-10-CM

## 2019-03-17 DIAGNOSIS — E1122 Type 2 diabetes mellitus with diabetic chronic kidney disease: Secondary | ICD-10-CM

## 2019-03-17 LAB — PROTIME-INR
INR: 1.7 — ABNORMAL HIGH (ref 0.8–1.2)
Prothrombin Time: 19.4 seconds — ABNORMAL HIGH (ref 11.4–15.2)

## 2019-03-17 LAB — GLUCOSE, CAPILLARY
Glucose-Capillary: 228 mg/dL — ABNORMAL HIGH (ref 70–99)
Glucose-Capillary: 112 mg/dL — ABNORMAL HIGH (ref 70–99)
Glucose-Capillary: 146 mg/dL — ABNORMAL HIGH (ref 70–99)
Glucose-Capillary: 185 mg/dL — ABNORMAL HIGH (ref 70–99)
Glucose-Capillary: 135 mg/dL — ABNORMAL HIGH (ref 70–99)

## 2019-03-17 LAB — COMPREHENSIVE METABOLIC PANEL
ALT: 89 U/L — ABNORMAL HIGH (ref 0–44)
AST: 85 U/L — ABNORMAL HIGH (ref 15–41)
Albumin: 2.6 g/dL — ABNORMAL LOW (ref 3.5–5.0)
Alkaline Phosphatase: 173 U/L — ABNORMAL HIGH (ref 38–126)
Anion gap: 16 — ABNORMAL HIGH (ref 5–15)
BUN: 69 mg/dL — ABNORMAL HIGH (ref 8–23)
CO2: 20 mmol/L — ABNORMAL LOW (ref 22–32)
Calcium: 8.7 mg/dL — ABNORMAL LOW (ref 8.9–10.3)
Chloride: 104 mmol/L (ref 98–111)
Creatinine, Ser: 1.98 mg/dL — ABNORMAL HIGH (ref 0.44–1.00)
GFR calc Af Amer: 28 mL/min — ABNORMAL LOW (ref >60)
GFR calc non Af Amer: 24 mL/min — ABNORMAL LOW (ref >60)
Glucose, Bld: 167 mg/dL — ABNORMAL HIGH (ref 70–99)
Potassium: 3.6 mmol/L (ref 3.5–5.1)
Sodium: 140 mmol/L (ref 135–145)
Total Bilirubin: 3.2 mg/dL — ABNORMAL HIGH (ref 0.3–1.2)
Total Protein: 5.9 g/dL — ABNORMAL LOW (ref 6.5–8.1)

## 2019-03-17 LAB — CBC
HCT: 19.8 % — ABNORMAL LOW (ref 36.0–46.0)
Hemoglobin: 7.2 g/dL — ABNORMAL LOW (ref 12.0–15.0)
MCH: 26.1 pg (ref 26.0–34.0)
MCHC: 36.4 g/dL — ABNORMAL HIGH (ref 30.0–36.0)
MCV: 71.7 fL — ABNORMAL LOW (ref 80.0–100.0)
Platelets: 72 10*3/uL — ABNORMAL LOW (ref 150–400)
RBC: 2.76 MIL/uL — ABNORMAL LOW (ref 3.87–5.11)
RDW: 15.8 % — ABNORMAL HIGH (ref 11.5–15.5)
WBC: 7.1 10*3/uL (ref 4.0–10.5)
nRBC: 0.7 % — ABNORMAL HIGH (ref 0.0–0.2)

## 2019-03-17 LAB — CULTURE, RESPIRATORY: Culture: NORMAL

## 2019-03-17 LAB — URINE CULTURE
Culture: >=100000 — AB
Special Requests: NORMAL

## 2019-03-17 LAB — CULTURE, RESPIRATORY W GRAM STAIN: Special Requests: NORMAL

## 2019-03-17 MED ORDER — AMOXICILLIN-POT CLAVULANATE 500-125 MG PO TABS
1.0000 | ORAL_TABLET | Freq: Two times a day (BID) | ORAL | Status: DC
  Administered 2019-03-17 – 2019-03-18 (×3): 500 mg via ORAL
  Filled 2019-03-17 (×5): qty 1

## 2019-03-17 MED ORDER — SODIUM CHLORIDE 0.9 % IV SOLN
INTRAVENOUS | Status: DC | PRN
  Administered 2019-03-17: 20:00:00 via INTRAVENOUS

## 2019-03-17 MED ORDER — FUROSEMIDE 10 MG/ML IJ SOLN
40.0000 mg | Freq: Once | INTRAMUSCULAR | Status: AC
  Administered 2019-03-17: 40 mg via INTRAVENOUS
  Filled 2019-03-17: qty 4

## 2019-03-17 MED ORDER — INSULIN ASPART 100 UNIT/ML ~~LOC~~ SOLN
0.0000 [IU] | Freq: Three times a day (TID) | SUBCUTANEOUS | Status: DC
  Administered 2019-03-17: 23:00:00 2 [IU] via SUBCUTANEOUS
  Administered 2019-03-17: 17:00:00 3 [IU] via SUBCUTANEOUS
  Administered 2019-03-18: 14:00:00 5 [IU] via SUBCUTANEOUS

## 2019-03-17 MED ORDER — CHLORHEXIDINE GLUCONATE CLOTH 2 % EX PADS
6.0000 | MEDICATED_PAD | Freq: Every day | CUTANEOUS | Status: DC
  Administered 2019-03-17: 6 via TOPICAL

## 2019-03-17 MED ORDER — INSULIN ASPART 100 UNIT/ML ~~LOC~~ SOLN
5.0000 [IU] | Freq: Three times a day (TID) | SUBCUTANEOUS | Status: DC
  Administered 2019-03-17 – 2019-03-18 (×3): 5 [IU] via SUBCUTANEOUS

## 2019-03-17 MED ORDER — LEVOFLOXACIN 250 MG PO TABS
250.0000 mg | ORAL_TABLET | Freq: Every day | ORAL | Status: DC
  Filled 2019-03-17: qty 1

## 2019-03-17 NOTE — Progress Notes (Signed)
STROKE TEAM PROGRESS NOTE   INTERVAL HISTORY Patient sitting in bed, having breakfast.  More awake alert, more interactive, less lethargic.  INR 1.7 today.  Still on Levophed for BP support.  Plan to taper off.   Vitals:   03/17/19 0845 03/17/19 0900 03/17/19 0915 03/17/19 0930  BP: 111/70 120/74 (!) 127/51 104/82  Pulse: 73 75 83 77  Resp: 13 (!) 21 15 18   Temp:      TempSrc:      SpO2: 99% 100% 98% 100%  Weight:      Height:        CBC:  Recent Labs  Lab 03/14/19 1940 03/15/19 0354 03/16/19 0426  WBC 8.9 7.4 8.9  NEUTROABS 7.2 5.3  --   HGB 11.0* 9.5* 8.9*  HCT 30.0* Unable to determine due to a cold agglutinin 24.4*  MCV 69.6* Unable to determine due to a cold agglutinin 69.3*  PLT 97* 83* 76*    Basic Metabolic Panel:  Recent Labs  Lab 03/16/19 0426 03/17/19 0505  NA 141 140  K 3.9 3.6  CL 108 104  CO2 18* 20*  GLUCOSE 89 167*  BUN 70* 69*  CREATININE 1.76* 1.98*  CALCIUM 8.5* 8.7*   Lipid Panel:     Component Value Date/Time   CHOL 165 03/15/2019 0354   TRIG 51 03/15/2019 0354   TRIG 51 03/15/2019 0354   HDL 40 (L) 03/15/2019 0354   CHOLHDL 4.1 03/15/2019 0354   VLDL 10 03/15/2019 0354   LDLCALC 115 (H) 03/15/2019 0354   HgbA1c:  Lab Results  Component Value Date   HGBA1C 6.2 (H) 03/15/2019   Urine Drug Screen:     Component Value Date/Time   LABOPIA NONE DETECTED 03/14/2019 2103   COCAINSCRNUR NONE DETECTED 03/14/2019 2103   LABBENZ NONE DETECTED 03/14/2019 2103   AMPHETMU NONE DETECTED 03/14/2019 2103   THCU NONE DETECTED 03/14/2019 2103   LABBARB NONE DETECTED 03/14/2019 2103    Alcohol Level     Component Value Date/Time   ETH <10 03/14/2019 1940    IMAGING Mr Brain Wo Contrast  Result Date: 03/15/2019 CLINICAL DATA:  Slurred speech with RIGHT-sided weakness and RIGHT gaze. Status post clot retrieval from the LEFT ICA. EXAM: MRI HEAD WITHOUT CONTRAST TECHNIQUE: Multiplanar, multiecho pulse sequences of the brain and surrounding  structures were obtained without intravenous contrast. COMPARISON:  CTA head neck 03/14/2019. Catheter angiogram 03/14/2019. FINDINGS: The patient was unable to remain motionless for the exam. Small or subtle lesions could be overlooked. Brain: Small foci of acute infarction are seen in the LEFT hemisphere, MCA territory affecting the LEFT frontal and LEFT posterior frontal cortex and regional white matter. The largest area is located in the precentral gyrus, roughly 1 cm in size. No visible associated hemorrhage. Additional areas of restricted diffusion in the RIGHT subdural space, most notably over the frontal lobe, represent the previously reported subdural hematoma. No significant mass effect from these collections, which measures only a few mm in thickness, maximal 5 mm over the RIGHT inferior frontal lobe. Elsewhere, generalized atrophy. Mild to moderate subcortical and periventricular T2 and FLAIR hyperintensities, likely chronic microvascular ischemic change. Vascular: Normal flow voids. Skull and upper cervical spine: Motion degraded, grossly negative. Sinuses/Orbits: Negative. Other: None. IMPRESSION: Small foci of acute infarction, LEFT MCA territory affecting the frontal and posterior frontal cortex and white matter. See discussion above. No associated hemorrhage. Small volume subdural hematoma over the RIGHT convexity, non worrisome. Atrophy and small vessel disease. Flow voids  demonstrate patency of the LEFT carotid system status post clot retrieval. Electronically Signed   By: Staci Righter M.D.   On: 03/15/2019 17:08   Vas [place] Lower Extremity Venous (dvt)  Result Date: 03/15/2019  Lower Venous Study Indications: Stroke, and Edema.  Limitations: Body habitus, poor ultrasound/tissue interface and poor patient cooperation; constant movement. Performing Technologist: Maudry Mayhew MHA, RDMS, RVT, RDCS  Examination Guidelines: A complete evaluation includes B-mode imaging, spectral Doppler, color  Doppler, and power Doppler as needed of all accessible portions of each vessel. Bilateral testing is considered an integral part of a complete examination. Limited examinations for reoccurring indications may be performed as noted.  +---------+---------------+---------+-----------+----------+-------------------+ RIGHT    CompressibilityPhasicitySpontaneityPropertiesSummary             +---------+---------------+---------+-----------+----------+-------------------+ CFV      Full                                                             +---------+---------------+---------+-----------+----------+-------------------+ SFJ                                                   Not visualized      +---------+---------------+---------+-----------+----------+-------------------+ FV Prox  Full                                                             +---------+---------------+---------+-----------+----------+-------------------+ FV Mid                                                patent, limited                                                           visualization       +---------+---------------+---------+-----------+----------+-------------------+ FV Distal                                             Not visualized      +---------+---------------+---------+-----------+----------+-------------------+ POP      Full                                                             +---------+---------------+---------+-----------+----------+-------------------+ PTV  patent, limited                                                           visualization       +---------+---------------+---------+-----------+----------+-------------------+ PERO                                                  Not visualized      +---------+---------------+---------+-----------+----------+-------------------+    +---------+---------------+---------+-----------+----------+--------------+ LEFT     CompressibilityPhasicitySpontaneityPropertiesSummary        +---------+---------------+---------+-----------+----------+--------------+ CFV                                                   Not visualized +---------+---------------+---------+-----------+----------+--------------+ SFJ                                                   Not visualized +---------+---------------+---------+-----------+----------+--------------+ FV Prox  Full                                                        +---------+---------------+---------+-----------+----------+--------------+ FV Mid                                                Not visualized +---------+---------------+---------+-----------+----------+--------------+ FV Distal                                             Not visualized +---------+---------------+---------+-----------+----------+--------------+ PFV                                                   Not visualized +---------+---------------+---------+-----------+----------+--------------+ POP      Full           Yes      Yes                                 +---------+---------------+---------+-----------+----------+--------------+ PTV                                                   Not visualized +---------+---------------+---------+-----------+----------+--------------+ PERO  Not visualized +---------+---------------+---------+-----------+----------+--------------+     Summary: Right: There is no evidence of deep vein thrombosis in the lower extremity. However, portions of this examination were limited- see technologist comments above. No cystic structure found in the popliteal fossa. Left: There is no evidence of deep vein thrombosis in the lower extremity. However, portions of this examination were limited-  see technologist comments above. No cystic structure found in the popliteal fossa.  *See table(s) above for measurements and observations. Electronically signed by Ruta Hinds MD on 03/15/2019 at 6:41:43 PM.    Final    [place] Ekg Site Rite  Result Date: 03/16/2019 If Site Rite image not attached, placement could not be confirmed due to current cardiac rhythm.  Cerebral angio S/P lt common carotid arteriogram followed by complete revascularization of symptomatic severe stenosis of Lt ICA prox sec to a large nearly occlusive flow limiting thrombus with mechanical thrombectomy with x 1 pass with a 28m x 40 mm solitaire retreiver with prox flow arrest and penumbra aspiration . LT MCA TICI 3 revascularization .  2D Echocardiogram   1. The left ventricle has hyperdynamic systolic function, with an ejection fraction of >65%. The cavity size was normal. Left ventricular diastolic Doppler parameters are consistent with impaired relaxation. No evidence of left ventricular regional wall  motion abnormalities.  2. Left atrial size was mildly dilated.  3. The ascending aorta and aortic root are normal in size and structure.   PHYSICAL EXAM  Temp:  [97.3 F (36.3 C)-97.7 F (36.5 C)] 97.6 F (36.4 C) (04/22 0800) Pulse Rate:  [66-96] 77 (04/22 0930) Resp:  [10-22] 18 (04/22 0930) BP: (99-129)/(42-99) 104/82 (04/22 0930) SpO2:  [96 %-100 %] 100 % (04/22 0930)  General - Well nourished, well developed, not in acute distress.  Ophthalmologic - fundi not visualized due to noncooperation.  Cardiovascular - Regular rate and rhythm.  Neuro - awake alert, follow simple commands. No aphasia, fully orientated. Naming 3/3 and able to repeat. Visual field full, PERRL, EOMI, slight right nasolabial fold flattening, tongue midline. BUE 4/5 proximal and 4+/5 distally, BLE 3/5 proximal and 5/5 distal. Sensation symmetrical, FTN bilaterally intact but slow in action. DTR 1+ and no babinski. Gait not tested.     ASSESSMENT/PLAN Ms. Monica Pearson a 73y.o. female with history of NASH cirrhoisis, DB, GERD, HTN, sarcoid[facility] with transient slurred speech, R sided weakness and L gaze that resolved enroute. In ED, devloped dysarthria and R sided weakness.   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 ASPECTS 10.     CTA head & neck L ICA nonocclusive thrombus at origin 190mlong w/ severe narrowing. No IC occlusion/stenosis. Left PCA stenosis   Cerebral angio TICI3 revascularization severe stenosis L ICA w/ solitaire and penumbra  Post IR CT no ICH or mass effect MRI  Small L MCA infarct frontal and posterior frontal cortex and white matter. Small SDH over R convexity  2D Echo EF >65%. No source of embolus   LE venous doppler no DVT  LDL 115  HgbA1c 7.1  SCDs for VTE prophylaxis - INR 1.6->1.9->1.7  No antithrombotic prior to admission, on No antithrombotic for now given recent SDH and elevated INR with thrombocytopenia and severe anemia  Therapy recommendations:  CIR   Disposition:  Pending  Transfer to the floor  once off levophed  Anticipate d/c to CIR tomorrow  Acute Respiratory Failure Basilar Atelectasis Sarcoidosis Sepsis  Intubated for mechanical thrombectomy  Extubated 4/20  Trach aspirate - abundant WBC, no organisms  Empiric zosyn for aspiration & UTI coverage-> changed to Augmentin  CCM on board  Uncompensated NASH cirrhosis  With baseline ascites and leg edema  LE venous doppler neg DVT  Hyperammonemia @ 55->40 - on lactulose  Elevated INR 1.9->1.6->1.9->1.7  Thrombocytopenia 97->83->76->72  Anemia Hb 11.0->9.5->8.9  completed albumin infusion  On lactulose   CCM on board  Anemia, microcytic  Hemoglobin 11.0-9.5-8.9-7.2  Iron 96, ferritin 236, TIBC 200  Could be acute  blood loss due to endovascular procedure  Close H&H monitoring - 6pm pending  Check stool occult blood  AKI vs. Hepatorenal syndrome  Creatinine 1.34-1.78-1.72-1.76-1.98   Encourage p.o. intake  Gentle hydration with normal saline at 40  Hx of stroke / SDH  12/2018 - acute on chronic SDH, EEG neg for seizure - improving and now only minimal residue   11/2016 admitted for right side weakness, MRI left pontine infarct, MRA neg, CUS/TTE neg. LDL 104 and A1C 8.3. Her ASA changed to plavix and added zetia  Follows with Dr. Leonie Man at [facility]  Hypotension Hx Hypertension  Home meds:  Lasix 20, propranolol 10 bid, spironolactone 50  On levophed - weaning underway. Hope to have off by this afternoon . SBP goal 100-140 . As per daughter-in-law, baseline BP at home 100s . Gentle hydration with NS at 40  Hyperlipidemia  Home meds:  zetia 10  LDL 115, goal < 70  No statin due to elevated LFT and cirrhosis  Continue zetia at discharge  Diabetes type II Uncontrolled w/ retinopathy, nephropathy, vasculopathy  Home meds:  Glipizide 10, lispro 15u tid, liraglutide 1.8 q hs, metformin 500 bid  HgbA1c 7.1, goal < 7.0  CBGs  Hyperglycemia  SSI 0-15  DM coordinator recommendation appreciated  On Norvasc 5 unit 3 times daily WC  Pyuria  Blood Cx reincubated for better growth  U Cx >100k klebsiella pneumoniae, 50K enterococcus faecalis  Empiric zosyn for aspiration & UTI coverage->changed to Augmentin  Dysphagia, resolved  Secondary to stroke  On Dys 3 thin lqiuids  SLP following  Other Stroke Risk Factors  Advanced age  Former Cigarette smoker  Morbid Obesity, Body mass index is 41.47 kg/m., recommend weight loss, diet and exercise as appropriate   Family hx stroke (mother)  PVD  OSA with CPAP at home  Other Active Problems  GERD  [facility] day # 3  This patient is critically ill due to stroke, left ICA near occlusion, uncompensated  cirrhosis, respiratory failure, hypotension, hyperglycemia and at significant risk of neurological worsening, death form recurrent stroke, hemorrhagic conversion, cirrhosis, sepsis, DKA, seizure. This patient's care requires constant monitoring of vital signs, hemodynamics, respiratory and cardiac monitoring, review of multiple databases, neurological assessment, discussion with family, other specialists and medical decision making of high complexity. I spent 40 minutes of neurocritical care time in the care of this patient.     Rosalin Hawking, MD PhD Stroke Neurology 03/17/2019 10:03 AM   To contact Stroke Continuity provider, please refer to [email or web address] After hours, contact General Neurology

## 2019-03-17 NOTE — Progress Notes (Signed)
  Speech Language Pathology Treatment: Dysphagia;Cognitive-Linquistic  Patient Details Name: Monica Pearson MRN: [id number] DOB: February 02, 1946 Today's Date: 03/17/2019 Time: 4982-6415 SLP Time Calculation (min) (ACUTE ONLY): 17 min  Assessment / Plan / Recommendation Clinical Impression  Dentures donned and consumed regular texture efficiently and pt in agreement to upgrade texture to regular. No indications of decreased airway protection with thin via straw.   Remainder of session focused on cognition (memory/attention). She recalled all activities/visitors from this am accurately including names of 4 providers and reason for visits. Comprehended and recalled information from critical care MD who arrived during session. She was able to generate items and alternate between two categories when directed by therapist. Speech is intelligible but noted times of mildly distorted fricative phonemes. Session interrupted by pt needing ultrasound. Hopeful CIR soon.    HPI HPI: Patient is a 73 yo female who presents with slurred speech, right sided weakness, left gaze preference. MRI small foci of acute infarction, LEFT MCA territory affecting the frontal and posterior frontal cortex and white matter. No associated hemorrhage. Small volume subdural hematoma over the RIGHT convexity, non worrisome. CTA- left internal artery occlusion s/p revascularization 4/19. Intubated 4/19-4/20. Also noted to have posssible aspiration PNA and UTI? PMH includes SDH, CVA. HTN, DM, cirrhosis. CXR The overall inspiratory effort is poor with right basilar atelectasis.      SLP Plan  Continue with current plan of care       Recommendations  Diet recommendations: Regular;Thin liquid Liquids provided via: Cup;Straw Medication Administration: Whole meds with liquid Supervision: Patient able to self feed Compensations: Slow rate;Small sips/bites Postural Changes and/or Swallow Maneuvers: Seated upright 90 degrees                 Oral Care Recommendations: Oral care BID Follow up Recommendations: Inpatient Rehab SLP Visit Diagnosis: Cognitive communication deficit (R41.841);Dysphagia, unspecified (R13.10) Plan: Continue with current plan of care       GO                [name] 03/17/2019, 10:59 AM   [name].Ed Risk analyst [phone] Office [phone]

## 2019-03-17 NOTE — Progress Notes (Addendum)
Physical Therapy Treatment Patient Details Name: Monica Pearson MRN: [id number] DOB: [birth year] Today's Date: 03/17/2019    History of Present Illness Patient is a 73 y/o female who presents with slurred speech, right sided weakness, left gaze preference. Head CT-unremarkable. 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 Also noted to have posssible aspiration PNA and UTI? PMH includes SDH, CVA. HTN, DM, cirrhosis.    PT Comments    Patient is progressing very well towards their physical therapy goals. Requiring min assist for transfers, with increased ambulation distance x 20 feet using walker. Continues to demonstrate decreased cognition, balance impairments, right sided weakness and gait abnormalities. Continue to recommend comprehensive inpatient rehab (CIR) for post-acute therapy needs. Suspect pt will progress well based on motivation and PLOF.     Follow Up Recommendations  CIR;Supervision for mobility/OOB     Equipment Recommendations  None recommended by PT    Recommendations for Other Services       Precautions / Restrictions Precautions Precautions: Fall Restrictions Weight Bearing Restrictions: No    Mobility  Bed Mobility Overal bed mobility: Needs Assistance Bed Mobility: Rolling;Sidelying to Sit Rolling: Min guard Sidelying to sit: Min guard;HOB elevated       General bed mobility comments: Min Guard A for safety. HOB elevated to assist with trunk support. Use of bed rail  Transfers Overall transfer level: Needs assistance Equipment used: Rolling walker (2 wheeled) Transfers: Sit to/from Stand Sit to Stand: +2 physical assistance;Min assist;+2 safety/equipment         General transfer comment: Min A to power up into standing. cues for hand placement and weight shift forward  Ambulation/Gait Ambulation/Gait assistance: Min assist;+2 safety/equipment Gait Distance (Feet): 20 Feet Assistive device: Rolling walker (2 wheeled) Gait  Pattern/deviations: Step-through pattern;Decreased stride length;Decreased step length - right Gait velocity: decreased   General Gait Details: Slow, mildly unsteady gait with cues for sequencing and direction changes. Decreased step length RLE   Stairs             Wheelchair Mobility    Modified Rankin (Stroke Patients Only)       Balance Overall balance assessment: Needs assistance Sitting-balance support: Feet supported;Single extremity supported Sitting balance-Leahy Scale: Fair     Standing balance support: During functional activity;Single extremity supported Standing balance-Leahy Scale: Poor                              Cognition Arousal/Alertness: Awake/alert Behavior During Therapy: WFL for tasks assessed/performed Overall Cognitive Status: Impaired/Different from baseline Area of Impairment: Attention;Following commands;Safety/judgement;Awareness                   Current Attention Level: Selective   Following Commands: Follows one step commands with increased time;Follows one step commands consistently Safety/Judgement: Decreased awareness of deficits Awareness: Emergent Problem Solving: Slow processing;Decreased initiation;Difficulty sequencing;Requires verbal cues;Requires tactile cues General Comments: Pt motivated to participate in therapy. Continues to present decreased processing and awareness.       Exercises      General Comments BP Pre mobility: 105/57 BP Post mobility: 131/58      Pertinent Vitals/Pain Pain Assessment: Faces Faces Pain Scale: No hurt Pain Intervention(s): Monitored during session    Home Living                      Prior Function            PT Goals (current goals can now  be found in the care plan section) Acute Rehab PT Goals Patient Stated Goal: to get something to drink/eat Potential to Achieve Goals: Good Progress towards PT goals: Progressing toward goals    Frequency     Min 4X/week      PT Plan Current plan remains appropriate    Co-evaluation PT/OT/SLP Co-Evaluation/Treatment: Yes Reason for Co-Treatment: To address functional/ADL transfers PT goals addressed during session: Mobility/safety with mobility OT goals addressed during session: ADL's and self-care      AM-PAC PT "6 Clicks" Mobility   Outcome Measure  Help needed turning from your back to your side while in a flat bed without using bedrails?: A Little Help needed moving from lying on your back to sitting on the side of a flat bed without using bedrails?: A Little Help needed moving to and from a bed to a chair (including a wheelchair)?: A Little Help needed standing up from a chair using your arms (e.g., wheelchair or bedside chair)?: A Little Help needed to walk in hospital room?: A Little Help needed climbing 3-5 steps with a railing? : A Lot 6 Click Score: 17    End of Session Equipment Utilized During Treatment: Gait belt Activity Tolerance: Patient tolerated treatment well Patient left: in chair;with call bell/phone within reach;with chair alarm set Nurse Communication: Mobility status PT Visit Diagnosis: Muscle weakness (generalized) (M62.81);Hemiplegia and hemiparesis;Difficulty in walking, not elsewhere classified (R26.2);Unsteadiness on feet (R26.81) Hemiplegia - Right/Left: Right Hemiplegia - dominant/non-dominant: Dominant Hemiplegia - caused by: Cerebral infarction     Time: 6578-4696 PT Time Calculation (min) (ACUTE ONLY): 32 min  Charges:  $Therapeutic Activity: 8-22 mins                     Ellamae Sia, PT, DPT Acute Rehabilitation Services Pager [phone] Office [phone]    Willy Eddy 03/17/2019, 2:03 PM

## 2019-03-17 NOTE — Plan of Care (Signed)
Pt understands plan of care and is actively participating to meet goals. Pt states she looks forward to CIR and then going home.

## 2019-03-17 NOTE — Progress Notes (Signed)
NAME:  Monica Pearson, MRN:  [id number], DOB:  April 26, 1946, LOS: 3 ADMISSION DATE:  03/14/2019, CONSULTATION DATE:  4/19 REFERRING MD:  Dr. Cheral Marker, CHIEF COMPLAINT:  AMS, R sided weakness   Brief History   72 y/o F, Jehovah's Witness, admitted 4/19 with reports of slurred speech, right-sided weakness and leftward gaze.  CT head negative but CTA neck revealed left internal carotid artery occlusion.  She underwent IR endovascular revascularization with mechanical thrombectomy and was returned to ICU on mechanical ventilation.     Past Medical History  Sarcoidosis  HLD HTN GERD NASH Cirrhosis  SDH  DM  Significant Hospital Events   4/19  Admit, Endovascular Revascularization of L ICA, mechanical thrombectomy 4/21  Pt remains on levophed 8 mcg, mental status appears to be at baseline  Consults:  PCCM   Procedures:  IR 4/19  RUE Midline 4/20 >> 4/21  RUE PICC 4/21 >>  Significant Diagnostic Tests:  CT Head 4/19 >> negative  CTA Neck 4/19 >> non-occlusive thrombus within the left ICA, no intracranial occlusion or stenosis  Micro Data:  BCx2 4/20 >>  UC 4/20 >> klebsiella pneumoniae >> R-ampillin, nitrofurantoin.  S- ceftriaxone, cipro                     Enterobacter faecalis >> pan sens Tracheal aspirate 4/20 >>   Antimicrobials:  Zosyn 4/19 >>   Interim history/subjective:  RN reports weaning levophed, no acute events overnight.  Pt reports swelling.   Objective   Blood pressure 124/72, pulse 69, temperature 97.6 F (36.4 C), temperature source Oral, resp. rate 18, height 5' 3"  (1.6 m), weight 106.2 kg, SpO2 98 %.        Intake/Output Summary (Last 24 hours) at 03/17/2019 0920 Last data filed at 03/17/2019 0909 Gross per 24 hour  Intake 1841.69 ml  Output 400 ml  Net 1441.69 ml   Filed Weights   03/14/19 1900 03/14/19 2014  Weight: 106.2 kg 106.2 kg    Examination: General: ill appearing adult female lying in bed  HEENT: MM pink/moist, mild scleral icterus  Neuro: AAOx4, pleasant, MAE CV: s1s2 rrr, no m/r/g PULM: even/non-labored, lungs bilaterally clear anterior, crackles at bases  GI: soft, non-tender, bsx4 active, abdominal wall pitting edema  Extremities: warm/dry, generalized edema 2+ Skin: no rashes or lesions  Resolved Hospital Problem list   Acute Respiratory Insufficiency   Assessment & Plan:   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  -s/p neuroIR revascularization 4/20 -hx of SDH P: Per neurology, appreciate in put  PT / OT  Diet as tolerated   R Basilar Atelectasis  P: Pulmonary hygiene -IS, mobilize  Follow intermittent CXR  Zosyn as above  Hypotension  Severe Sepsis  -in setting of UTI P: Wean levophed for MAP >55 with normal mental status & urinary output Follow cultures  Hx Sarcoidosis  P: Monitor / Supportive care   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  P: Continue lactulose Follow ammonia, LFT's intermittently   Microcytic Anemia  Jehovah's Witness  Thrombocytopenia  P: Trend CBC  No transfusion due to religious beliefs  HIT negative, unclear why sent on admit  CKD III/IV P: Trend BMP / urinary output Replace electrolytes as indicated Avoid nephrotoxic agents, ensure adequate renal perfusion  DM  Hypoglycemia P: SSI Discontinue dextrose / IVF  Best practice:  Diet: NPO Pain/Anxiety/Delirium protocol (if indicated): n/a  VAP protocol (if indicated): n/a DVT prophylaxis: SCD's  GI prophylaxis: PPI  Glucose control:  n/a Mobility: bedrest  Code Status: Full Code  Family Communication: Pt updated on am rounds 4/22.   Disposition: ICU.    Labs   CBC: Recent Labs  Lab 03/14/19 1940 03/15/19 0354 03/16/19 0426  WBC 8.9 7.4 8.9  NEUTROABS 7.2 5.3  --   HGB 11.0* 9.5* 8.9*  HCT 30.0* Unable to determine due to a cold agglutinin 24.4*  MCV 69.6* Unable to determine due to a cold agglutinin 69.3*  PLT 97* 83* 76*    Basic Metabolic Panel: Recent Labs  Lab 03/14/19  1940 03/14/19 1941 03/15/19 0354 03/16/19 0426 03/17/19 0505  NA 133*  --  134* 141 140  K 4.7  --  3.5 3.9 3.6  CL 98  --  103 108 104  CO2 26  --  22 18* 20*  GLUCOSE 172*  --  106* 89 167*  BUN 81*  --  76* 70* 69*  CREATININE 1.78* 1.70* 1.72* 1.76* 1.98*  CALCIUM 8.3*  --  8.1* 8.5* 8.7*   GFR: Estimated Creatinine Clearance: 29.5 mL/min (A) (by C-G formula based on SCr of 1.98 mg/dL (H)). Recent Labs  Lab 03/14/19 1940 03/15/19 0354 03/16/19 0426  WBC 8.9 7.4 8.9    Liver Function Tests: Recent Labs  Lab 03/14/19 1940 03/16/19 0426 03/17/19 0505  AST 104* 92* 85*  ALT 102* 87* 89*  ALKPHOS 225* 153* 173*  BILITOT 2.6* 4.8* 3.2*  PROT 5.8* 5.6* 5.9*  ALBUMIN 2.1* 2.6* 2.6*   No results for input(s): LIPASE, AMYLASE in the last 168 hours. Recent Labs  Lab 03/14/19 1941 03/16/19 0426  AMMONIA 55* 40*    ABG    Component Value Date/Time   PHART 7.443 03/15/2019 0600   PCO2ART 32.3 03/15/2019 0600   PO2ART 163 (H) 03/15/2019 0600   HCO3 21.8 03/15/2019 0600   ACIDBASEDEF 1.8 03/15/2019 0600   O2SAT 99.2 03/15/2019 0600     Coagulation Profile: Recent Labs  Lab 03/14/19 1940 03/16/19 0426 03/17/19 0505  INR 1.6* 1.9* 1.7*    Cardiac Enzymes: No results for input(s): CKTOTAL, CKMB, CKMBINDEX, TROPONINI in the last 168 hours.  HbA1C: Hgb A1c MFr Bld  Date/Time Value Ref Range Status  03/15/2019 03:54 AM 6.2 (H) 4.8 - 5.6 % Final    Comment:    (NOTE)         Prediabetes: 5.7 - 6.4         Diabetes: >6.4         Glycemic control for adults with diabetes: <7.0   01/16/2019 05:49 AM 7.1 (H) 4.8 - 5.6 % Final    Comment:    (NOTE) Pre diabetes:          5.7%-6.4% Diabetes:              >6.4% Glycemic control for   <7.0% adults with diabetes     CBG: Recent Labs  Lab 03/16/19 1535 03/16/19 2002 03/16/19 2347 03/17/19 0343 03/17/19 0826  GLUCAP 204* 274* 254* 228* 112*    Critical care time: 30 minutes     Noe Gens,  NP-C [facility] Pgr: [phone] or if no answer [phone] 03/17/2019, 9:20 AM

## 2019-03-17 NOTE — Progress Notes (Addendum)
PROGRESS NOTE    Monica Pearson  BPP:[id number] DOB: October 23, 1946 DOA: 03/14/2019 PCP: Jenel Lucks, PA-C   Brief Narrative: Per HPI: [phone] 73 y.o.female reformed smoker presented to the [facility] emergency department via [facility] with complaints of slurred speech, right-sided weakness and leftward gaze preference. Her symptoms reportedly resolved in route to the emergency department; however, in the emergency department, she developed recurring symptoms along with right-sided facial droop. Code STROKE was called. CT of the head was negative; however, CTA of the neck revealed left internal carotid artery occlusion. The patient is seen in 4 and 23 after undergoing mechanical thrombectomy. She has returned from the interventional radiology suite on mechanical ventilatory support. We are consulted for assistance with ventilator management.  4/21: Patient was extubated by PCCM on 4/20. She is doing well on room air, but failed swallow study. Plans are for speech to evaluate today. She continues to remain on pressors, but this is being weaned. PICC line today for better access. UTI with GNR for which she remains on Zosyn today. Will need to go to CIR on DC.    Assessment & Plan:   Principal Problem:   Stroke (cerebrum) (HCC) Active Problems:   GERD (gastroesophageal reflux disease)   Stenosis of left carotid artery   Endotracheally intubated   Sarcoidosis of lung (HCC)   Bacteriuria with pyuria   Bradycardia   CKD stage 3 due to type 2 diabetes mellitus ([place])   Type 2 diabetes mellitus with complication, with long-term current use of insulin (HCC)   Microcytic anemia   Thrombocytopenia (HCC)   Compromised airway   AKI (acute kidney injury) ([place])   Hepatic cirrhosis (HCC)   SDH (subdural hematoma) (HCC)   Dyslipidemia   Tobacco abuse   Acute blood loss anemia   Acute respiratory failure ([place])   1. 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   1. Patient has prior history of subdural hematoma. 2. Echo on 4/20 with EF >65%, no WMA (see report)  3. LE [place] negative for DVT 4. CTA head and neck with nonocclusive thrombus within L ICA at its origin, length of approximately 12 mm (see report) 5. S/p revascularization by neuro interventional radiology, 4/19 6. Per neurolgoy, holding antithrombotic at this time given recent SDH and elevated INR with thrombocytopenia and anemia  2. Klebsiella and enterococcus faecalis UTI.  S/p 4 days zosyn.  No clear UTI sx, but given hypotension, will continue abx for now. 1. Will transition to augmentin to cover enterococcus    3. AKI on CKD stage II- creatinine rising, to 1.98 today.  Baseline appears to be less than 1. 1. Given 40 mg IV lasix x1 given anasarca 2. Follow I/O, daily weights  4. Hypotension: wean levophed as tolerated.  Etiology of hypotension unclear, possibly related to infection?  Will continue to monitor.   5. Microcytic anemia with thrombocytopenia - downtrending. Iron studies normal and HIT ab wnl.. No overt bleeding identified. 1. Hb downtrending today, follow repeat Hb which is pending 2. Stool hemoccult pending  6. Nash cirrhosis with ascites.  Ascites search by [place] with mild to moderate ascites except in RLQ.  No fevers or abdominal tenderness.  Will hold off on diagnostic tap for now. 1. Diuresis as noted above   7. History of sarcoidosis-status post extubation 4/20. Stable and on RA now.   8. Type 2 diabetes with complications along with morbid obesity. Continue SSI. Blood glucose currently stable.  9. GERD. PPI.  10. Volume Overload: home spironolactone and lasix on hold.  Given lasix 40 mg x1 here.  Continue to monitor.   DVT prophylaxis: SCD Code Status: full  Family Communication: none at bedside, will call to update - called son Disposition Plan: pending  Consultants:   PCCM  Neurology  Procedures:   Cerebral angiogram 4/19 with mechanical  thrombectomy by neuro IR  Antimicrobials:  Anti-infectives (From admission, onward)   Start     Dose/Rate Route Frequency Ordered Stop   03/17/19 1100  amoxicillin-clavulanate (AUGMENTIN) 500-125 MG per tablet 500 mg     1 tablet Oral 2 times daily 03/17/19 1002     03/17/19 1000  levofloxacin (LEVAQUIN) tablet 250 mg  Status:  Discontinued     250 mg Oral Daily 03/17/19 0944 03/17/19 0956   03/15/19 0600  piperacillin-tazobactam (ZOSYN) IVPB 3.375 g  Status:  Discontinued     3.375 g 12.5 mL/hr over 240 Minutes Intravenous Every 8 hours 03/15/19 0030 03/17/19 0948   03/15/19 0045  piperacillin-tazobactam (ZOSYN) IVPB 3.375 g     3.375 g 100 mL/hr over 30 Minutes Intravenous  Once 03/15/19 0030 03/15/19 0145   03/14/19 2122  vancomycin (VANCOCIN) 1-5 GM/200ML-% IVPB    Note to Pharmacy:  Novella Rob   : cabinet override      03/14/19 2122 03/15/19 0929     Subjective: No complaints. Denies UTI symptoms.   Objective: Vitals:   03/17/19 1345 03/17/19 1400 03/17/19 1415 03/17/19 1430  BP: (!) 115/52 (!) 117/57 105/68 110/87  Pulse: 81 79 78 80  Resp: 13 17 12 12   Temp:      TempSrc:      SpO2: 100% 100% 100% 100%  Weight:      Height:        Intake/Output Summary (Last 24 hours) at 03/17/2019 1558 Last data filed at 03/17/2019 1200 Gross per 24 hour  Intake 1749.37 ml  Output --  Net 1749.37 ml   Filed Weights   03/14/19 1900 03/14/19 2014  Weight: 106.2 kg 106.2 kg    Examination:  General exam: Appears calm and comfortable  Respiratory system: Clear to auscultation. Respiratory effort normal. Cardiovascular system: S1 & S2 heard, RRR.  Gastrointestinal system: protuberant abdomen with pitting edema. Central nervous system: Alert and oriented.  Extremities: bilateral LE edema Psychiatry: Judgement and insight appear normal. Mood & affect appropriate.     Data Reviewed: I have personally reviewed following labs and imaging studies  CBC: Recent Labs  Lab  03/14/19 1940 03/15/19 0354 03/16/19 0426 03/17/19 1117  WBC 8.9 7.4 8.9 7.1  NEUTROABS 7.2 5.3  --   --   HGB 11.0* 9.5* 8.9* 7.2*  HCT 30.0* Unable to determine due to Myrtie Leuthold cold agglutinin 24.4* 19.8*  MCV 69.6* Unable to determine due to Priya Matsen cold agglutinin 69.3* 71.7*  PLT 97* 83* 76* 72*   Basic Metabolic Panel: Recent Labs  Lab 03/14/19 1940 03/14/19 1941 03/15/19 0354 03/16/19 0426 03/17/19 0505  NA 133*  --  134* 141 140  K 4.7  --  3.5 3.9 3.6  CL 98  --  103 108 104  CO2 26  --  22 18* 20*  GLUCOSE 172*  --  106* 89 167*  BUN 81*  --  76* 70* 69*  CREATININE 1.78* 1.70* 1.72* 1.76* 1.98*  CALCIUM 8.3*  --  8.1* 8.5* 8.7*   GFR: Estimated Creatinine Clearance: 29.5 mL/min (Naydelin Ziegler) (by C-G formula based on SCr of 1.98 mg/dL (  H)). Liver Function Tests: Recent Labs  Lab 03/14/19 1940 03/16/19 0426 03/17/19 0505  AST 104* 92* 85*  ALT 102* 87* 89*  ALKPHOS 225* 153* 173*  BILITOT 2.6* 4.8* 3.2*  PROT 5.8* 5.6* 5.9*  ALBUMIN 2.1* 2.6* 2.6*   No results for input(s): LIPASE, AMYLASE in the last 168 hours. Recent Labs  Lab 03/14/19 1941 03/16/19 0426  AMMONIA 55* 40*   Coagulation Profile: Recent Labs  Lab 03/14/19 1940 03/16/19 0426 03/17/19 0505  INR 1.6* 1.9* 1.7*   Cardiac Enzymes: No results for input(s): CKTOTAL, CKMB, CKMBINDEX, TROPONINI in the last 168 hours. BNP (last 3 results) No results for input(s): PROBNP in the last 8760 hours. HbA1C: Recent Labs    03/15/19 0354  HGBA1C 6.2*   CBG: Recent Labs  Lab 03/16/19 2347 03/17/19 0343 03/17/19 0826 03/17/19 1208 03/17/19 1544  GLUCAP 254* 228* 112* 146* 185*   Lipid Profile: Recent Labs    03/15/19 0354  CHOL 165  HDL 40*  LDLCALC 115*  TRIG 51   51  CHOLHDL 4.1   Thyroid Function Tests: No results for input(s): TSH, T4TOTAL, FREET4, T3FREE, THYROIDAB in the last 72 hours. Anemia Panel: Recent Labs    03/15/19 1100  FERRITIN 236  TIBC 200*  IRON 96   Sepsis Labs: No  results for input(s): PROCALCITON, LATICACIDVEN in the last 168 hours.  Recent Results (from the past 240 hour(s))  Culture, blood (Routine X 2) w Reflex to ID Panel     Status: None (Preliminary result)   Collection Time: 03/15/19  2:42 AM  Result Value Ref Range Status   Specimen Description BLOOD RIGHT HAND  Final   Special Requests   Final    BOTTLES DRAWN AEROBIC AND ANAEROBIC Blood Culture adequate volume   Culture   Final    NO GROWTH 2 DAYS Performed at [facility], [address] [place], [place] [address]    Report Status PENDING  Incomplete  Culture, blood (Routine X 2) w Reflex to ID Panel     Status: None (Preliminary result)   Collection Time: 03/15/19  2:42 AM  Result Value Ref Range Status   Specimen Description BLOOD RIGHT HAND  Final   Special Requests   Final    BOTTLES DRAWN AEROBIC AND ANAEROBIC Blood Culture adequate volume   Culture   Final    NO GROWTH 2 DAYS Performed at [facility], [place] [address], [place], [place] [address]    Report Status PENDING  Incomplete  Urine Culture     Status: Abnormal   Collection Time: 03/15/19  3:54 AM  Result Value Ref Range Status   Specimen Description URINE, CATHETERIZED  Final   Special Requests   Final    Normal Performed at [facility], [place] [address] [place], [place] [address]    Culture (Joniya Boberg)  Final    >=100,000 COLONIES/mL KLEBSIELLA PNEUMONIAE 50,000 COLONIES/mL ENTEROCOCCUS FAECALIS    Report Status 03/17/2019 FINAL  Final   Organism ID, Bacteria KLEBSIELLA PNEUMONIAE (Teriyah Purington)  Final   Organism ID, Bacteria ENTEROCOCCUS FAECALIS (Duaine Radin)  Final      Susceptibility   Enterococcus faecalis - MIC*    AMPICILLIN <=2 SENSITIVE Sensitive     LEVOFLOXACIN 2 SENSITIVE Sensitive     NITROFURANTOIN <=16 SENSITIVE Sensitive     VANCOMYCIN 1 SENSITIVE Sensitive     * 50,000 COLONIES/mL ENTEROCOCCUS FAECALIS   Klebsiella pneumoniae - MIC*    AMPICILLIN RESISTANT Resistant  CEFAZOLIN <=4 SENSITIVE  Sensitive     CEFTRIAXONE <=1 SENSITIVE Sensitive     CIPROFLOXACIN <=0.25 SENSITIVE Sensitive     GENTAMICIN <=1 SENSITIVE Sensitive     IMIPENEM <=0.25 SENSITIVE Sensitive     NITROFURANTOIN 128 RESISTANT Resistant     TRIMETH/SULFA <=20 SENSITIVE Sensitive     AMPICILLIN/SULBACTAM 8 SENSITIVE Sensitive     PIP/TAZO <=4 SENSITIVE Sensitive     Extended ESBL NEGATIVE Sensitive     * >=100,000 COLONIES/mL KLEBSIELLA PNEUMONIAE  MRSA PCR Screening     Status: None   Collection Time: 03/15/19  3:54 AM  Result Value Ref Range Status   MRSA by PCR NEGATIVE NEGATIVE Final    Comment:        The GeneXpert MRSA Assay (FDA approved for NASAL specimens only), is one component of Sheila Gervasi comprehensive MRSA colonization surveillance program. It is not intended to diagnose MRSA infection nor to guide or monitor treatment for MRSA infections. Performed at [facility], [place] [address], [place], [place] [address]   Culture, respiratory     Status: None   Collection Time: 03/15/19 11:18 AM  Result Value Ref Range Status   Specimen Description TRACHEAL ASPIRATE  Final   Special Requests Normal  Final   Gram Stain   Final    ABUNDANT WBC PRESENT,BOTH PMN AND MONONUCLEAR NO ORGANISMS SEEN    Culture   Final    RARE Consistent with normal respiratory flora. Performed at [facility], [place] [address], [place], [place] [address]    Report Status 03/17/2019 FINAL  Final         Radiology Studies: Mr Brain Wo Contrast  Result Date: 03/15/2019 CLINICAL DATA:  Slurred speech with RIGHT-sided weakness and RIGHT gaze. Status post clot retrieval from the LEFT ICA. EXAM: MRI HEAD WITHOUT CONTRAST TECHNIQUE: Multiplanar, multiecho pulse sequences of the brain and surrounding structures were obtained without intravenous contrast. COMPARISON:  CTA head neck 03/14/2019. Catheter angiogram 03/14/2019. FINDINGS: The patient was unable to remain motionless for the exam. Small or subtle lesions  could be overlooked. Brain: Small foci of acute infarction are seen in the LEFT hemisphere, MCA territory affecting the LEFT frontal and LEFT posterior frontal cortex and regional white matter. The largest area is located in the precentral gyrus, roughly 1 cm in size. No visible associated hemorrhage. Additional areas of restricted diffusion in the RIGHT subdural space, most notably over the frontal lobe, represent the previously reported subdural hematoma. No significant mass effect from these collections, which measures only Trinadee Verhagen few mm in thickness, maximal 5 mm over the RIGHT inferior frontal lobe. Elsewhere, generalized atrophy. Mild to moderate subcortical and periventricular T2 and FLAIR hyperintensities, likely chronic microvascular ischemic change. Vascular: Normal flow voids. Skull and upper cervical spine: Motion degraded, grossly negative. Sinuses/Orbits: Negative. Other: None. IMPRESSION: Small foci of acute infarction, LEFT MCA territory affecting the frontal and posterior frontal cortex and white matter. See discussion above. No associated hemorrhage. Small volume subdural hematoma over the RIGHT convexity, non worrisome. Atrophy and small vessel disease. Flow voids demonstrate patency of the LEFT carotid system status post clot retrieval. Electronically Signed   By: Staci Righter M.D.   On: 03/15/2019 17:08   Dg Chest Port 1 View  Result Date: 03/17/2019 CLINICAL DATA:  Occluded PICC line. EXAM: PORTABLE CHEST 1 VIEW COMPARISON:  Chest x-ray dated March 15, 2019. FINDINGS: Interval placement of Jartavious Mckimmy right upper extremity PICC line with the tip at the cavoatrial junction.  Interval removal of the endotracheal and enteric tubes. Stable cardiomediastinal silhouette. Normal pulmonary vascularity. Persistent low lung volumes with mild bibasilar atelectasis. No focal consolidation, pleural effusion, or pneumothorax. No acute osseous abnormality. IMPRESSION: 1. Persistent low lung volumes with mild bibasilar  atelectasis. Electronically Signed   By: Titus Dubin M.D.   On: 03/17/2019 11:52   [place] Ascites (abdomen Limited)  Result Date: 03/17/2019 CLINICAL DATA:  73 year old female with cirrhosis.  Query ascites. EXAM: LIMITED ABDOMEN ULTRASOUND FOR ASCITES TECHNIQUE: Limited ultrasound survey for ascites was performed in all four abdominal quadrants. COMPARISON:  Ultrasound 05/15/2018. FINDINGS: Free fluid is demonstrated in both upper quadrants and the left lower quadrant. There is free fluid in the midline. However, the right lower quadrant is spared. IMPRESSION: Ascites is present except in the right lower quadrant. Volume appears mild to moderate. Electronically Signed   By: Genevie Ann M.D.   On: 03/17/2019 11:35   [place] Ekg Site Rite  Result Date: 03/16/2019 If Site Rite image not attached, placement could not be confirmed due to current cardiac rhythm.       Scheduled Meds:  amoxicillin-clavulanate  1 tablet Oral BID   chlorhexidine gluconate (MEDLINE KIT)  15 mL Mouth Rinse BID   Chlorhexidine Gluconate Cloth  6 each Topical Daily   insulin aspart  0-15 Units Subcutaneous TID WC & HS   insulin aspart  5 Units Subcutaneous TID WC   lactulose  10 g Oral BID   magnesium oxide  400 mg Oral QHS   mouth rinse  15 mL Mouth Rinse q12n4p   pantoprazole  40 mg Oral Q1200   sodium chloride flush  10-40 mL Intracatheter Q12H   Continuous Infusions:  sodium chloride     norepinephrine (LEVOPHED) Adult infusion Stopped (03/17/19 1116)     LOS: 3 days    Time spent: over 30 min    Fayrene Helper, MD [facility] Pager AMION  If 7PM-7AM, please contact night-coverage [email or web address] Password [facility] 03/17/2019, 3:58 PM

## 2019-03-17 NOTE — Progress Notes (Addendum)
Inpatient Rehabilitation-Admissions Coordinator   Pt remains on levophed with weaning in process. Anticipate medical readiness tomorrow, pending medical clearance. Will follow up with pt tomorrow for possible admit to CIR.   Jhonnie Garner, OTR/L  Rehab Admissions Coordinator  [phone] 03/17/2019 10:25 AM

## 2019-03-17 NOTE — Progress Notes (Signed)
Inpatient Diabetes Program Recommendations  AACE/ADA: New Consensus Statement on Inpatient Glycemic Control (2015)  Target Ranges:  Prepandial:   less than 140 mg/dL      Peak postprandial:   less than 180 mg/dL (1-2 hours)      Critically ill patients:  140 - 180 mg/dL   Lab Results  Component Value Date   GLUCAP 112 (H) 03/17/2019   HGBA1C 6.2 (H) 03/15/2019    Review of Glycemic Control  Diabetes history: DM 2 Outpatient Diabetes medications: Glipizide 10 mg BID, Humalog 0-15 units SSI, Victoza 1.5 mg qhs, Metformin 500 mg BID Current orders for Inpatient glycemic control: Novolog 0-15 units q4 hours  Inpatient Diabetes Program Recommendations:    A1c 7.1% on 2/22  Glucose trends increase after meals into the 200 range. Consider Novolog 5 units tid meal coverage if patient consumes at least 50% of meals.  Thanks,  Tama Headings RN, MSN, BC-ADM Inpatient Diabetes Coordinator Team Pager [phone] (8a-5p)

## 2019-03-17 NOTE — Progress Notes (Signed)
Occupational Therapy Treatment Patient Details Name: Monica Pearson MRN: [id number] DOB: 1946-04-10 Today's Date: 03/17/2019    History of present illness Patient is a 73 y/o female who presents with slurred speech, right sided weakness, left gaze preference. Head CT-unremarkable. 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 Also noted to have posssible aspiration PNA and UTI? PMH includes SDH, CVA. HTN, DM, cirrhosis.   OT comments  Pt progressing towards established OT goals. Pt highly motivated to participate in therapy. Pt performing toileting with Min A +2 for transfer to [facility] and Max A for toilet hygiene. Pt performing grooming at sink with Min Guard A-Min A. Continues to present with decreased balance and cognition. Continue to recommend dc to CIR and will continue to follow acutely as admitted.    Follow Up Recommendations  CIR    Equipment Recommendations  None recommended by OT    Recommendations for Other Services Rehab consult    Precautions / Restrictions Precautions Precautions: Fall Restrictions Weight Bearing Restrictions: No       Mobility Bed Mobility Overal bed mobility: Needs Assistance Bed Mobility: Rolling;Sidelying to Sit Rolling: Min guard Sidelying to sit: Min guard;HOB elevated       General bed mobility comments: Min Guard A for safety. HOB elevated to assist with trunk support. Use of bed rail  Transfers Overall transfer level: Needs assistance Equipment used: Rolling walker (2 wheeled) Transfers: Sit to/from Stand Sit to Stand: +2 physical assistance;Min assist;+2 safety/equipment         General transfer comment: Min A to power up into standing. cues for hadn placement and weight shift forward    Balance Overall balance assessment: Needs assistance Sitting-balance support: Feet supported;Single extremity supported Sitting balance-Leahy Scale: Fair     Standing balance support: During functional activity;Single extremity  supported Standing balance-Leahy Scale: Poor                             ADL either performed or assessed with clinical judgement   ADL Overall ADL's : Needs assistance/impaired     Grooming: Wash/dry hands;Brushing hair;Wash/dry face;Minimal assistance;Min guard;Standing Grooming Details (indicate cue type and reason): Pt performing grooming at sink with Min Guard-Min A for balance and safety.              Lower Body Dressing: Maximal assistance;+2 for safety/equipment;Sit to/from stand Lower Body Dressing Details (indicate cue type and reason): Max A for donning socks. Pt would benefit from further education on AE Toilet Transfer: Minimal assistance;BSC;Ambulation;RW Toilet Transfer Details (indicate cue type and reason): Min A for power up into standing.  Toileting- Clothing Manipulation and Hygiene: Maximal assistance;Sit to/from stand Toileting - Clothing Manipulation Details (indicate cue type and reason): Pt able to maintain standing. Pt requiring Max A for toilet hygiene.      Functional mobility during ADLs: +2 for safety/equipment;Minimal assistance;Rolling walker General ADL Comments: Pt limited by weakness, lethargy and decreased attention      Vision       Perception     Praxis      Cognition Arousal/Alertness: Awake/alert Behavior During Therapy: WFL for tasks assessed/performed Overall Cognitive Status: Impaired/Different from baseline Area of Impairment: Attention;Following commands;Safety/judgement;Awareness                   Current Attention Level: Selective   Following Commands: Follows one step commands with increased time;Follows one step commands consistently Safety/Judgement: Decreased awareness of deficits Awareness: Emergent Problem Solving: Slow processing;Decreased  initiation;Difficulty sequencing;Requires verbal cues;Requires tactile cues General Comments: Pt motivated to participate in therapy. Continues to present  decreased processing and awareness.         Exercises     Shoulder Instructions       General Comments VSS    Pertinent Vitals/ Pain       Pain Assessment: Faces Faces Pain Scale: No hurt Pain Intervention(s): Monitored during session  Home Living                                          Prior Functioning/Environment              Frequency  Min 2X/week        Progress Toward Goals  OT Goals(current goals can now be found in the care plan section)  Progress towards OT goals: Progressing toward goals  Acute Rehab OT Goals Patient Stated Goal: to get something to drink/eat OT Goal Formulation: With patient Time For Goal Achievement: 03/29/19 Potential to Achieve Goals: Good ADL Goals Pt Will Perform Grooming: with min assist;standing Pt Will Perform Upper Body Bathing: with min assist;sitting Pt Will Perform Lower Body Bathing: with min assist;sit to/from stand;with adaptive equipment Pt Will Perform Upper Body Dressing: with set-up;with supervision;sitting Pt Will Perform Lower Body Dressing: with min assist;with adaptive equipment;sit to/from stand Pt Will Transfer to Toilet: with min assist;ambulating;regular height toilet;bedside commode;grab bars Pt Will Perform Toileting - Clothing Manipulation and hygiene: with min assist;sit to/from stand Additional ADL Goal #1: Pt will sustain attention to familiar ADL task with min cues c 5 mins  Plan Discharge plan remains appropriate    Co-evaluation    PT/OT/SLP Co-Evaluation/Treatment: Yes Reason for Co-Treatment: For patient/therapist safety;To address functional/ADL transfers   OT goals addressed during session: ADL's and self-care      AM-PAC OT "6 Clicks" Daily Activity     Outcome Measure   Help from another person eating meals?: A Little Help from another person taking care of personal grooming?: A Lot Help from another person toileting, which includes using toliet, bedpan, or  urinal?: A Lot Help from another person bathing (including washing, rinsing, drying)?: A Lot Help from another person to put on and taking off regular upper body clothing?: A Lot Help from another person to put on and taking off regular lower body clothing?: Total 6 Click Score: 12    End of Session Equipment Utilized During Treatment: Gait belt;Rolling walker  OT Visit Diagnosis: Unsteadiness on feet (R26.81);Muscle weakness (generalized) (M62.81)   Activity Tolerance Patient tolerated treatment well   Patient Left with call bell/phone within reach;in chair;with chair alarm set   Nurse Communication Mobility status        Time: 7062-3762 OT Time Calculation (min): 30 min  Charges: OT General Charges $OT Visit: 1 Visit OT Treatments $Self Care/Home Management : 8-22 mins  [place], OTR/L Acute Rehab Pager: [phone] Office: [place] 03/17/2019, 1:09 PM

## 2019-03-18 ENCOUNTER — Inpatient Hospital Stay (HOSPITAL_COMMUNITY): Payer: Medicare Other

## 2019-03-18 ENCOUNTER — Inpatient Hospital Stay (HOSPITAL_COMMUNITY)
Admission: RE | Admit: 2019-03-18 | Discharge: 2019-03-24 | DRG: 057 | Disposition: A | Discharge status: Other Acute Inpatient Hospital | Payer: Medicare Other | Source: Intra-hospital | Attending: Physical Medicine & Rehabilitation | Admitting: Physical Medicine & Rehabilitation

## 2019-03-18 DIAGNOSIS — G8191 Hemiplegia, unspecified affecting right dominant side: Secondary | ICD-10-CM

## 2019-03-18 DIAGNOSIS — N183 Chronic kidney disease, stage 3 unspecified: Secondary | ICD-10-CM

## 2019-03-18 DIAGNOSIS — J9601 Acute respiratory failure with hypoxia: Secondary | ICD-10-CM

## 2019-03-18 DIAGNOSIS — Z8052 Family history of malignant neoplasm of bladder: Secondary | ICD-10-CM | POA: Diagnosis not present

## 2019-03-18 DIAGNOSIS — K766 Portal hypertension: Secondary | ICD-10-CM | POA: Diagnosis present

## 2019-03-18 DIAGNOSIS — K219 Gastro-esophageal reflux disease without esophagitis: Secondary | ICD-10-CM | POA: Diagnosis present

## 2019-03-18 DIAGNOSIS — D869 Sarcoidosis, unspecified: Secondary | ICD-10-CM | POA: Diagnosis present

## 2019-03-18 DIAGNOSIS — Z87891 Personal history of nicotine dependence: Secondary | ICD-10-CM

## 2019-03-18 DIAGNOSIS — N390 Urinary tract infection, site not specified: Secondary | ICD-10-CM | POA: Diagnosis present

## 2019-03-18 DIAGNOSIS — I69351 Hemiplegia and hemiparesis following cerebral infarction affecting right dominant side: Principal | ICD-10-CM

## 2019-03-18 DIAGNOSIS — Z8042 Family history of malignant neoplasm of prostate: Secondary | ICD-10-CM

## 2019-03-18 DIAGNOSIS — R609 Edema, unspecified: Secondary | ICD-10-CM | POA: Diagnosis not present

## 2019-03-18 DIAGNOSIS — K589 Irritable bowel syndrome without diarrhea: Secondary | ICD-10-CM | POA: Diagnosis present

## 2019-03-18 DIAGNOSIS — E11319 Type 2 diabetes mellitus with unspecified diabetic retinopathy without macular edema: Secondary | ICD-10-CM | POA: Diagnosis present

## 2019-03-18 DIAGNOSIS — K7460 Unspecified cirrhosis of liver: Secondary | ICD-10-CM | POA: Diagnosis present

## 2019-03-18 DIAGNOSIS — Z823 Family history of stroke: Secondary | ICD-10-CM | POA: Diagnosis not present

## 2019-03-18 DIAGNOSIS — I129 Hypertensive chronic kidney disease with stage 1 through stage 4 chronic kidney disease, or unspecified chronic kidney disease: Secondary | ICD-10-CM | POA: Diagnosis present

## 2019-03-18 DIAGNOSIS — K767 Hepatorenal syndrome: Secondary | ICD-10-CM | POA: Diagnosis present

## 2019-03-18 DIAGNOSIS — R140 Abdominal distension (gaseous): Secondary | ICD-10-CM

## 2019-03-18 DIAGNOSIS — K7290 Hepatic failure, unspecified without coma: Secondary | ICD-10-CM | POA: Diagnosis not present

## 2019-03-18 DIAGNOSIS — B952 Enterococcus as the cause of diseases classified elsewhere: Secondary | ICD-10-CM | POA: Diagnosis present

## 2019-03-18 DIAGNOSIS — R188 Other ascites: Secondary | ICD-10-CM | POA: Diagnosis present

## 2019-03-18 DIAGNOSIS — Z8249 Family history of ischemic heart disease and other diseases of the circulatory system: Secondary | ICD-10-CM

## 2019-03-18 DIAGNOSIS — K7581 Nonalcoholic steatohepatitis (NASH): Secondary | ICD-10-CM | POA: Diagnosis not present

## 2019-03-18 DIAGNOSIS — N184 Chronic kidney disease, stage 4 (severe): Secondary | ICD-10-CM | POA: Diagnosis not present

## 2019-03-18 DIAGNOSIS — I63512 Cerebral infarction due to unspecified occlusion or stenosis of left middle cerebral artery: Secondary | ICD-10-CM

## 2019-03-18 DIAGNOSIS — M7989 Other specified soft tissue disorders: Secondary | ICD-10-CM | POA: Diagnosis not present

## 2019-03-18 DIAGNOSIS — E7800 Pure hypercholesterolemia, unspecified: Secondary | ICD-10-CM | POA: Diagnosis present

## 2019-03-18 DIAGNOSIS — Z881 Allergy status to other antibiotic agents status: Secondary | ICD-10-CM | POA: Diagnosis not present

## 2019-03-18 DIAGNOSIS — I69320 Aphasia following cerebral infarction: Secondary | ICD-10-CM | POA: Diagnosis not present

## 2019-03-18 DIAGNOSIS — Z888 Allergy status to other drugs, medicaments and biological substances status: Secondary | ICD-10-CM

## 2019-03-18 DIAGNOSIS — E8770 Fluid overload, unspecified: Secondary | ICD-10-CM | POA: Diagnosis not present

## 2019-03-18 DIAGNOSIS — N179 Acute kidney failure, unspecified: Secondary | ICD-10-CM

## 2019-03-18 DIAGNOSIS — E1122 Type 2 diabetes mellitus with diabetic chronic kidney disease: Secondary | ICD-10-CM | POA: Diagnosis present

## 2019-03-18 DIAGNOSIS — Z885 Allergy status to narcotic agent status: Secondary | ICD-10-CM

## 2019-03-18 DIAGNOSIS — E785 Hyperlipidemia, unspecified: Secondary | ICD-10-CM | POA: Diagnosis present

## 2019-03-18 DIAGNOSIS — K7682 Hepatic encephalopathy: Secondary | ICD-10-CM

## 2019-03-18 DIAGNOSIS — Z794 Long term (current) use of insulin: Secondary | ICD-10-CM

## 2019-03-18 DIAGNOSIS — R198 Other specified symptoms and signs involving the digestive system and abdomen: Secondary | ICD-10-CM

## 2019-03-18 LAB — CBC
Hemoglobin: 7.7 g/dL — ABNORMAL LOW (ref 12.0–15.0)
Platelets: 70 10*3/uL — ABNORMAL LOW (ref 150–400)
WBC: 7.6 10*3/uL (ref 4.0–10.5)

## 2019-03-18 LAB — COMPREHENSIVE METABOLIC PANEL
ALT: 70 U/L — ABNORMAL HIGH (ref 0–44)
AST: 66 U/L — ABNORMAL HIGH (ref 15–41)
Albumin: 2.2 g/dL — ABNORMAL LOW (ref 3.5–5.0)
Alkaline Phosphatase: 166 U/L — ABNORMAL HIGH (ref 38–126)
Anion gap: 10 (ref 5–15)
BUN: 63 mg/dL — ABNORMAL HIGH (ref 8–23)
CO2: 22 mmol/L (ref 22–32)
Calcium: 8.1 mg/dL — ABNORMAL LOW (ref 8.9–10.3)
Chloride: 106 mmol/L (ref 98–111)
Creatinine, Ser: 1.95 mg/dL — ABNORMAL HIGH (ref 0.44–1.00)
GFR calc Af Amer: 29 mL/min — ABNORMAL LOW (ref >60)
GFR calc non Af Amer: 25 mL/min — ABNORMAL LOW (ref >60)
Glucose, Bld: 160 mg/dL — ABNORMAL HIGH (ref 70–99)
Potassium: 3.5 mmol/L (ref 3.5–5.1)
Sodium: 138 mmol/L (ref 135–145)
Total Bilirubin: 2.5 mg/dL — ABNORMAL HIGH (ref 0.3–1.2)
Total Protein: 5.2 g/dL — ABNORMAL LOW (ref 6.5–8.1)

## 2019-03-18 LAB — PROTIME-INR
INR: 1.9 — ABNORMAL HIGH (ref 0.8–1.2)
Prothrombin Time: 21.1 seconds — ABNORMAL HIGH (ref 11.4–15.2)

## 2019-03-18 LAB — AMMONIA
Ammonia: 40 umol/L — ABNORMAL HIGH (ref 9–35)
Ammonia: 33 umol/L (ref 9–35)

## 2019-03-18 LAB — GLUCOSE, CAPILLARY
Glucose-Capillary: 188 mg/dL — ABNORMAL HIGH (ref 70–99)
Glucose-Capillary: 209 mg/dL — ABNORMAL HIGH (ref 70–99)
Glucose-Capillary: 207 mg/dL — ABNORMAL HIGH (ref 70–99)
Glucose-Capillary: 139 mg/dL — ABNORMAL HIGH (ref 70–99)

## 2019-03-18 LAB — HEMOGLOBIN AND HEMATOCRIT, BLOOD
HCT: 22.4 % — ABNORMAL LOW (ref 36.0–46.0)
Hemoglobin: 8.8 g/dL — ABNORMAL LOW (ref 12.0–15.0)

## 2019-03-18 LAB — OCCULT BLOOD X 1 CARD TO LAB, STOOL: Fecal Occult Bld: POSITIVE — AB

## 2019-03-18 MED ORDER — SPIRONOLACTONE 12.5 MG HALF TABLET
12.5000 mg | ORAL_TABLET | Freq: Every day | ORAL | Status: DC
  Administered 2019-03-18: 12.5 mg via ORAL
  Filled 2019-03-18: qty 1

## 2019-03-18 MED ORDER — MAGNESIUM OXIDE 400 (241.3 MG) MG PO TABS: 400.0000 mg | ORAL_TABLET | Freq: Every day | ORAL | Status: AC

## 2019-03-18 MED ORDER — INSULIN ASPART 100 UNIT/ML ~~LOC~~ SOLN: 0.0000 [IU] | Freq: Three times a day (TID) | SUBCUTANEOUS | 11 refills | Status: DC

## 2019-03-18 MED ORDER — FUROSEMIDE 40 MG PO TABS
40.0000 mg | ORAL_TABLET | Freq: Every day | ORAL | Status: DC
  Administered 2019-03-18: 40 mg via ORAL
  Filled 2019-03-18: qty 1

## 2019-03-18 MED ORDER — INSULIN ASPART 100 UNIT/ML ~~LOC~~ SOLN: 5.0000 [IU] | Freq: Three times a day (TID) | SUBCUTANEOUS | 11 refills | Status: DC

## 2019-03-18 MED ORDER — AMOXICILLIN-POT CLAVULANATE 500-125 MG PO TABS: 1.0000 | ORAL_TABLET | Freq: Two times a day (BID) | ORAL | Status: DC

## 2019-03-18 MED ORDER — MAGNESIUM OXIDE 400 (241.3 MG) MG PO TABS
400.0000 mg | ORAL_TABLET | Freq: Every day | ORAL | Status: DC
  Administered 2019-03-18 – 2019-03-23 (×6): 400 mg via ORAL
  Filled 2019-03-18 (×6): qty 1

## 2019-03-18 MED ORDER — SORBITOL 70 % SOLN: 30.0000 mL | Freq: Every day | Status: DC | PRN

## 2019-03-18 MED ORDER — FUROSEMIDE 40 MG PO TABS
40.0000 mg | ORAL_TABLET | Freq: Every day | ORAL | Status: DC
  Administered 2019-03-19 – 2019-03-23 (×5): 40 mg via ORAL
  Filled 2019-03-18 (×5): qty 1

## 2019-03-18 MED ORDER — SENNOSIDES-DOCUSATE SODIUM 8.6-50 MG PO TABS: 1.0000 | ORAL_TABLET | Freq: Every evening | ORAL | Status: DC | PRN

## 2019-03-18 MED ORDER — INSULIN ASPART 100 UNIT/ML ~~LOC~~ SOLN
0.0000 [IU] | Freq: Three times a day (TID) | SUBCUTANEOUS | Status: DC
  Administered 2019-03-18: 18:00:00 5 [IU] via SUBCUTANEOUS
  Administered 2019-03-18: 22:00:00 2 [IU] via SUBCUTANEOUS
  Administered 2019-03-19: 5 [IU] via SUBCUTANEOUS
  Administered 2019-03-19: 3 [IU] via SUBCUTANEOUS
  Administered 2019-03-19: 22:00:00 2 [IU] via SUBCUTANEOUS
  Administered 2019-03-20: 18:00:00 3 [IU] via SUBCUTANEOUS
  Administered 2019-03-20: 2 [IU] via SUBCUTANEOUS
  Administered 2019-03-20: 3 [IU] via SUBCUTANEOUS
  Administered 2019-03-20: 08:00:00 2 [IU] via SUBCUTANEOUS
  Administered 2019-03-21 (×3): 3 [IU] via SUBCUTANEOUS
  Administered 2019-03-21 – 2019-03-22 (×5): 5 [IU] via SUBCUTANEOUS
  Administered 2019-03-23 – 2019-03-24 (×6): 3 [IU] via SUBCUTANEOUS

## 2019-03-18 MED ORDER — SIMETHICONE 80 MG PO CHEW
80.0000 mg | CHEWABLE_TABLET | Freq: Four times a day (QID) | ORAL | Status: DC
  Administered 2019-03-18 – 2019-03-24 (×22): 80 mg via ORAL
  Filled 2019-03-18 (×22): qty 1

## 2019-03-18 MED ORDER — FUROSEMIDE 40 MG PO TABS: 40.0000 mg | ORAL_TABLET | Freq: Every day | ORAL | Status: DC

## 2019-03-18 MED ORDER — SPIRONOLACTONE 12.5 MG HALF TABLET
12.5000 mg | ORAL_TABLET | Freq: Every day | ORAL | Status: DC
  Administered 2019-03-19 – 2019-03-23 (×5): 12.5 mg via ORAL
  Filled 2019-03-18 (×7): qty 1

## 2019-03-18 MED ORDER — SPIRONOLACTONE 25 MG PO TABS: 12.5000 mg | ORAL_TABLET | Freq: Every day | ORAL | Status: DC

## 2019-03-18 MED ORDER — PANTOPRAZOLE SODIUM 40 MG PO TBEC: 40.0000 mg | DELAYED_RELEASE_TABLET | Freq: Every day | ORAL | Status: AC

## 2019-03-18 MED ORDER — LACTULOSE 10 GM/15ML PO SOLN
10.0000 g | Freq: Two times a day (BID) | ORAL | Status: DC
  Administered 2019-03-18 – 2019-03-21 (×2): 10 g via ORAL
  Filled 2019-03-18 (×4): qty 15

## 2019-03-18 MED ORDER — SODIUM CHLORIDE 0.9 % IV SOLN: 40.0000 mL | INTRAVENOUS | 0 refills | Status: DC | PRN

## 2019-03-18 MED ORDER — ACETAMINOPHEN 325 MG PO TABS: 650.0000 mg | ORAL_TABLET | ORAL | Status: DC | PRN

## 2019-03-18 MED ORDER — ACETAMINOPHEN 160 MG/5ML PO SOLN: 650.0000 mg | ORAL | Status: DC | PRN

## 2019-03-18 MED ORDER — ACETAMINOPHEN 650 MG RE SUPP: 650.0000 mg | RECTAL | Status: DC | PRN

## 2019-03-18 MED ORDER — AMOXICILLIN-POT CLAVULANATE 500-125 MG PO TABS
1.0000 | ORAL_TABLET | Freq: Two times a day (BID) | ORAL | Status: DC
  Administered 2019-03-18 – 2019-03-22 (×9): 500 mg via ORAL
  Filled 2019-03-18 (×11): qty 1

## 2019-03-18 MED ORDER — PANTOPRAZOLE SODIUM 40 MG PO TBEC
40.0000 mg | DELAYED_RELEASE_TABLET | Freq: Every day | ORAL | Status: DC
  Administered 2019-03-19 – 2019-03-24 (×6): 40 mg via ORAL
  Filled 2019-03-18 (×6): qty 1

## 2019-03-18 MED ORDER — INSULIN ASPART 100 UNIT/ML ~~LOC~~ SOLN
5.0000 [IU] | Freq: Three times a day (TID) | SUBCUTANEOUS | Status: DC
  Administered 2019-03-18 – 2019-03-24 (×17): 5 [IU] via SUBCUTANEOUS

## 2019-03-18 NOTE — Progress Notes (Signed)
Inpatient Rehabilitation-Admissions Coordinator   [facility] has received insurance approval and medical approval for admit to CIR today. Pt still in agreement for CIR.  AC will contact RN, SW/CM regarding plan. Please call if questions.   Jhonnie Garner, OTR/L  Rehab Admissions Coordinator  [phone] 03/18/2019 12:04 PM

## 2019-03-18 NOTE — Progress Notes (Signed)
Monica Pearson, OT  Rehab Admission Coordinator  Physical Medicine and Rehabilitation  PMR Pre-admission  Signed  Date of Service:  03/16/2019 3:08 PM       Related encounter: ED to Hosp-Admission (Current) from 03/14/2019 in [place] [facility]      Signed         PMR Admission Coordinator Pre-Admission Assessment  Patient: Monica Pearson is an 73 y.o., female MRN: [id number] DOB: May 07, 1946 Height: 5' 3"  (160 cm) Weight: 113 kg                                                                                                                                                  Insurance Information HMO:     PPO: Yes     PCP:      IPA:      80/20:      OTHER:  PRIMARY: UHC Medicare      Policy#: [id number]      Subscriber: patient CM Name: Myra      Phone#: [phone]     Fax#:  Pre-Cert#: [id number]      Employer:  [employer] provided by Myra on 4/23 for admit to CIR. Pt is approved for 3 days (4/23-4/25). Due to the 4/25 being Saturday, clinical updates can be sent 4/27 (Monday) to follow up CM: Dorthula Nettles (p): [phone] (f): [phone] Benefits:  Phone #: NA     Name: online - [email or web address] Eff. Date: 12/26/2018 still active     Deduct: NA      Out of Pocket Max: $3,900 (met $1,770.77)      Life Max: NA CIR: $345/day co-pay for days 1-5, $0/day co-pay for days 6+      SNF: $0/day for days 1-20, $160/day for days 21-45, $0/day for days 46-100; limited to 100 days/cal yr Outpatient: limited by medical necessity     Co-Pay: $40/visit Home Health: 100%, limited by medical necessity      Co-Pay: 0% DME: 80%     Co-Pay: 20% Providers:  SECONDARY:       Policy#:       Subscriber:  CM Name:       Phone#:      Fax#:  Pre-Cert#:       Employer:  Benefits:  Phone #:     Name: Eff. Date:      Deduct:       Out of Pocket Max:       Life Max:  CIR:      SNF:  Outpatient:      Co-Pay:  Home Health:      Co-Pay:  DME:      Co-Pay:   Medicaid Application Date:       Case  Manager:  Disability Application Date:       Case Worker:   The Data Collection  Information Summary for patients in Inpatient Rehabilitation Facilities with attached Privacy Act [place] Records was provided and verbally reviewed with: Patient  Emergency Contact Information         Contact Information    Name Relation Home Work [name] [phone]  [phone]   [name], [name] Daughter   [phone]     Current Medical History  Patient Admitting Diagnosis: Left MCA infarct History of Present Illness: Monica Pearson is a 73 year old Jehovah witness female with history of liver cirrhosis,CKD stage II, 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 The pt presented 03/14/2019 with right-sided weakness and slurred speech. Cranial CT scan reviewed by neurology suspect left MCA infarction. Minimal right convexity subdural blood without acute hemorrhage. Patient did not receive TPA. Echocardiogram with ejection fraction of 65%, hyperdynamic systolic function. Lower extremity Dopplers negative for DVT. 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 Patient underwent left common carotid arteriogram followed by complete revascularization of symptomatic severe stenosis left ICA per interventional radiology on 03/14/2019. Noted acute respiratory failure following revascularization procedure required intubation until 03/15/2019. Patient was initially maintained on pressors for low blood pressure. Follow-up neurology services no anti-thrombotic at this time given recent SDH and mildly elevated INR 1.9 with thrombocytopenia. Venous Dopplers lower extremities negative. Urine study grade 100,000 Klebsiella, 50,000 enterococcus completing a course of Zosyn .Her diet has been advanced to mechanical soft. Therapy evaluations completed with  recommendation for CIR. Patient is to be admitted for a comprehensive rehabilitation program on 03/18/19.  Complete NIHSS TOTAL: 3 Glasgow Coma Scale Score: 15  Past Medical History      Past Medical History:  Diagnosis Date   Cirrhosis ([place])    Diabetes mellitus without complication ([place])    Diabetic retinopathy ([place])    Diabetic retinopathy ([place])    GERD (gastroesophageal reflux disease)    High cholesterol    Hypertension    Patient is Jehovah's Witness    Sarcoidosis    Stroke ([place])    Subdural hematoma ([place])     Family History  family history includes Bladder Cancer in her mother; CAD in her father; Prostate cancer in her father; Stroke in her mother.  Prior Rehab/Hospitalizations:  Has the patient had prior rehab or hospitalizations prior to admission? Yes  Has the patient had major surgery during 100 days prior to admission? Yes  Current Medications   Current Facility-Administered Medications:    0.9 %  sodium chloride infusion, , Intravenous, PRN, Rosalin Hawking, MD, Last Rate: 40 mL/hr at 03/17/19 2029   acetaminophen (TYLENOL) tablet 650 mg, 650 mg, Oral, Q4H PRN **OR** acetaminophen (TYLENOL) solution 650 mg, 650 mg, Per Tube, Q4H PRN **OR** acetaminophen (TYLENOL) suppository 650 mg, 650 mg, Rectal, Q4H PRN, Deveshwar, Sanjeev, MD   amoxicillin-clavulanate (AUGMENTIN) 500-125 MG per tablet 500 mg, 1 tablet, Oral, BID, Elodia Florence., MD, 500 mg at 03/18/19 1119   chlorhexidine gluconate (MEDLINE KIT) (PERIDEX) 0.12 % solution 15 mL, 15 mL, Mouth Rinse, BID, Kerney Elbe, MD, 15 mL at 03/17/19 0826   Chlorhexidine Gluconate Cloth 2 % PADS 6 each, 6 each, Topical, Daily, Ollis, Brandi L, NP, 6 each at 03/17/19 1706   furosemide (LASIX) tablet 40 mg, 40 mg, Oral, Daily, Tat, David, MD, 40 mg at 03/18/19 1119   insulin aspart (novoLOG) injection 0-15 Units, 0-15 Units, Subcutaneous, TID WC & HS, Erlinda Hong, Jindong,  MD, 2 Units at  03/17/19 2240   insulin aspart (novoLOG) injection 5 Units, 5 Units, Subcutaneous, TID WC, Rosalin Hawking, MD, 5 Units at 03/18/19 0909   lactulose ([place]) 10 GM/15ML solution 10 g, 10 g, Oral, BID, Kerney Elbe, MD, 10 g at 03/18/19 1119   magnesium oxide (MAG-OX) tablet 400 mg, 400 mg, Oral, QHS, Kerney Elbe, MD, 400 mg at 03/17/19 2238   MEDLINE mouth rinse, 15 mL, Mouth Rinse, q12n4p, Ollis, Brandi L, NP, 15 mL at 03/17/19 1659   norepinephrine (LEVOPHED) 52m in 252mpremix infusion, 2-16 mcg/min, Intravenous, Titrated, Ollis, Brandi L, NP, Stopped at 03/17/19 1116   pantoprazole (PROTONIX) EC tablet 40 mg, 40 mg, Oral, Q1200, Sood, Vineet, MD, 40 mg at 03/18/19 1119   senna-docusate (Senokot-S) tablet 1 tablet, 1 tablet, Oral, QHS PRN, LiKerney ElbeMD   sodium chloride flush (NS) 0.9 % injection 10-40 mL, 10-40 mL, Intracatheter, Q12H, MaGeorgette ShellMD, 10 mL at 03/17/19 2235   sodium chloride flush (NS) 0.9 % injection 10-40 mL, 10-40 mL, Intracatheter, PRN, MaGeorgette ShellMD   spironolactone (ALDACTONE) tablet 12.5 mg, 12.5 mg, Oral, Daily, Tat, David, MD, 12.5 mg at 03/18/19 1118  Patients Current Diet:     Diet Order                  Diet Carb Modified Fluid consistency: Thin; Room service appropriate? Yes  Diet effective now               Precautions / Restrictions Precautions Precautions: Fall Restrictions Weight Bearing Restrictions: No   Has the patient had 2 or more falls or a fall with injury in the past year?Yes  Prior Activity Level Community (5-7x/wk): retired; used to work on [employer] [employer]; did not drive PTA. Indepedent PTA  Prior Functional Level Prior Function Level of Independence: Independent with assistive device(s) Comments: Uses RW for ambulation; drives. Crochets at home  Self Care: Did the patient need help bathing, dressing, using the toilet or eating?  Independent  Indoor Mobility:  Did the patient need assistance with walking from room to room (with or without device)? Independent  Stairs: Did the patient need assistance with internal or external stairs (with or without device)? Independent  Functional Cognition: Did the patient need help planning regular tasks such as shopping or remembering to take medications? Needed some help, friend filled pill box  [place]ices/Equipment: Cane (specify quad or straight), Walker (specify type) Home Equipment: Walker - 2 wheels, Shower seat  Prior Device Use: Indicate devices/aids used by the patient prior to current illness, exacerbation or injury? Walker  Current Functional Level Cognition  Arousal/Alertness: Awake/alert Overall Cognitive Status: Impaired/Different from baseline Current Attention Level: Selective Orientation Level: Oriented X4 Following Commands: Follows one step commands with increased time, Follows one step commands consistently Safety/Judgement: Decreased awareness of deficits General Comments: Pt motivated to participate in therapy. Continues to present decreased processing and awareness.  Attention: Sustained Sustained Attention: Impaired Sustained Attention Impairment: Verbal complex Memory: Impaired Memory Impairment: Retrieval deficit Awareness: (questionable) Problem Solving: (suspects difficulty with high level) Safety/Judgment: Impaired    Extremity Assessment (includes Sensation/Coordination)  Upper Extremity Assessment: RUE deficits/detail RUE Deficits / Details: Decreased strength and cooridnation. Using during ADLs.  RUE Coordination: decreased fine motor, decreased gross motor  Lower Extremity Assessment: Defer to PT evaluation RLE Deficits / Details: Grossly ~3/5 throughout; swelling noted. RLE Sensation: WNL LLE Deficits / Details: Grossly ~3+/5  throughout, pitting edema present and swelling in LEs- tender to palpate LLE  Sensation: WNL    ADLs  Overall ADL's : Needs assistance/impaired Eating/Feeding: NPO Grooming: Wash/dry hands, Brushing hair, Wash/dry face, Minimal assistance, Min guard, Standing Grooming Details (indicate cue type and reason): Pt performing grooming at sink with Min Guard-Min A for balance and safety.  Upper Body Bathing: Maximal assistance, Sitting Lower Body Bathing: Maximal assistance, Sit to/from stand Upper Body Dressing : Moderate assistance, Sitting Lower Body Dressing: Maximal assistance, +2 for safety/equipment, Sit to/from stand Lower Body Dressing Details (indicate cue type and reason): Max A for donning socks. Pt would benefit from further education on AE Toilet Transfer: Minimal assistance, BSC, Ambulation, RW Toilet Transfer Details (indicate cue type and reason): Min A for power up into standing.  Toileting- Clothing Manipulation and Hygiene: Maximal assistance, Sit to/from stand Toileting - Clothing Manipulation Details (indicate cue type and reason): Pt able to maintain standing. Pt requiring Max A for toilet hygiene.  Functional mobility during ADLs: +2 for safety/equipment, Minimal assistance, Rolling walker General ADL Comments: Pt limited by weakness, lethargy and decreased attention     Mobility  Overal bed mobility: Needs Assistance Bed Mobility: Rolling, Sidelying to Sit Rolling: Min guard Sidelying to sit: Min guard, HOB elevated Sit to sidelying: Mod assist General bed mobility comments: Min Guard A for safety. HOB elevated to assist with trunk support. Use of bed rail    Transfers  Overall transfer level: Needs assistance Equipment used: Rolling walker (2 wheeled) Transfers: Sit to/from Stand Sit to Stand: +2 physical assistance, Min assist, +2 safety/equipment General transfer comment: Min A to power up into standing. cues for hand placement and weight shift forward    Ambulation / Gait / Stairs / Wheelchair Mobility   Ambulation/Gait Ambulation/Gait assistance: Min assist, +2 safety/equipment Gait Distance (Feet): 20 Feet Assistive device: Rolling walker (2 wheeled) Gait Pattern/deviations: Step-through pattern, Decreased stride length, Decreased step length - right General Gait Details: Slow, mildly unsteady gait with cues for sequencing and direction changes. Decreased step length RLE Gait velocity: decreased    Posture / Balance Balance Overall balance assessment: Needs assistance Sitting-balance support: Feet supported, Single extremity supported Sitting balance-Leahy Scale: Fair Standing balance support: During functional activity, Single extremity supported Standing balance-Leahy Scale: Poor Standing balance comment: Able to stand with 1 UE support in standing. total A for pericare.    Special needs/care consideration BiPAP/CPAP: yes CPAP at home CPM: no Continuous Drip IV: 0.9% sodium chloride infusion  Dialysis: no        Days: no Life Vest; no Oxygen: no Special Bed: no Trach Size: no Wound Vac (area): no      Location: no Skin: abrasion to abdomen, legs, chest; ecchymosis to Bilateral arms. Venous stasis ulcer to right calf.                   Bowel mgmt:chronic incontinence, last BM charted on 4/22. Bladder mgmt: external catheter in place Diabetic mgmt: no Behavioral consideration : no Chemo/radiation : no     Previous Home Environment (from acute therapy documentation) Living Arrangements: Alone  Lives With: Alone Available Help at Discharge: Family, Available PRN/intermittently Type of Home: House Home Layout: One level Home Access: Stairs to enter Entrance Stairs-Rails: Right Entrance Stairs-Number of Steps: 1 in front vs 5 in back Bathroom Shower/Tub: Multimedia programmer: [place]: Yes Type of Home Care Services: Home RN, [place] (if known): [facility]  Discharge Living Setting Plans for Discharge Living  Setting: Patient's home, Alone Type of Home at Discharge: House Discharge Home Layout: One level Discharge Home Access: Stairs to enter Entrance Stairs-Rails: Right, None(right rail in front entrance; no rails at back entrance) Entrance Stairs-Number of Steps: 8 in front; 2 in back Discharge Bathroom Shower/Tub: Tub/shower unit Discharge Bathroom Toilet: Handicapped height Discharge Bathroom Accessibility: Yes(tight for RW, will need to side step) How Accessible: Accessible via walker(possibly if sidestep with RW) Does the patient have any problems obtaining your medications?: No  Social/Family/Support Systems Patient Roles: Other (Comment)(separated from husband; has son nearby) Contact Information: son: Dorita Fray: [phone]; daugther in law Murlean Iba): [phone] Anticipated Caregiver: son and daughter in law as well as friends Diane [phone]) and Lelan Pons [phone]) who can check in daily (confirmed willingness to check in with Shauna Hugh and Lelan Pons). Anticipated Caregiver's Contact Information: see above Ability/Limitations of Caregiver: Supervision Caregiver Availability: Other (Comment)(son says mornigs vs evenings he can stop by; friends can call and stop by 1-2x/day Discharge Plan Discussed with Primary Caregiver: Yes(pt and son) Is Caregiver In Agreement with Plan?: Yes Does Caregiver/Family have Issues with Lodging/Transportation while Pt is in Rehab?: No   Goals/Additional Needs Patient/Family Goal for Rehab: PT/OT: Mod I/Supervision; SLP: Mod I Expected length of stay: 10-15 days Cultural Considerations: Jehovah's witness Dietary Needs: carb modified, thin liquids; calorie level: medium 1600-2000 Equipment Needs: TBD Pt/Family Agrees to Admission and willing to participate: Yes Program Orientation Provided & Reviewed with Pt/Caregiver Including Roles  & Responsibilities: Yes(pt and son)  Barriers to Discharge: Home environment access/layout, Lack of/limited family  support  Barriers to Discharge Comments: 8 steps to enter in front (2 in back); tub shower; limited caregiver support.    Decrease burden of Care through IP rehab admission: NA   Possible need for SNF placement upon discharge: Not anticipated; Pt has support from family and friends who can provide intermittent supervision and a good prognosis for further progress through CIR.     Patient Condition: This patient's medical and functional status has changed since the consult dated: 4/21 in which the Rehabilitation Physician determined and documented that the patient's condition is appropriate for intensive rehabilitative care in an inpatient rehabilitation facility. See "History of Present Illness" (above) for medical update. Functional changes are: improvement in transfers from mod A to min A x2, and improvement in ambulation assist and distance, from Mod A x2 for 5 feet to Min A x2 for 20 feet. Patient's medical and functional status update has been discussed with the Rehabilitation physician and patient remains appropriate for inpatient rehabilitation. Will admit to inpatient rehab today.  Preadmission Screen Completed By:  Monica Pearson, OT, 03/18/2019 12:39 PM ______________________________________________________________________   Discussed status with Dr. Letta Pate on 03/18/19 at 12:34PM and received approval for admission today.  Admission Coordinator:  Monica Pearson, time 12:34PM/Date 03/18/19         Cosigned by: Charlett Blake, MD at 03/18/2019 1:36 PM  Revision History

## 2019-03-18 NOTE — Progress Notes (Signed)
cbg at 11:58=235 mg/dL

## 2019-03-18 NOTE — Progress Notes (Signed)
To rehab alert and oriented patient per bed accompanied by RN. Oriented to unit set up.Fall precaution explained and signed by patient.

## 2019-03-18 NOTE — TOC Transition Note (Signed)
Transition of Care [facility]) - CM/SW Discharge Note   Patient Details  Name: Monica Pearson MRN: [id number] Date of Birth: Dec 27, 1945  Transition of Care [facility] CM/SW Contact:  Pollie Friar, RN Phone Number: 03/18/2019, 1:19 PM   Clinical Narrative:    Pt discharging to [facility] today. CM signing off.   Final next level of care: IP Rehab Facility Barriers to Discharge: Barriers Resolved   Patient Goals and CMS Choice        Discharge Placement                       Discharge Plan and Services                                     Social Determinants of Health (SDOH) Interventions     Readmission Risk Interventions No flowsheet data found.

## 2019-03-18 NOTE — H&P (Signed)
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 Per chart review patient and nursing, lives alone independent with a rolling walker prior to admission and still drives. One level home. She has a son in the area checks on her routinely. Presented 03/14/2019 with right-sided weakness and slurred speech. Cranial CT scan reviewed by neurology suspect left MCA infarction. Minimal right convexity subdural blood without acute hemorrhage. Patient did not receive TPA. Echocardiogram with ejection fraction of 65%, hyperdynamic systolic function. Lower extremity Dopplers negative for DVT. 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 Patient underwent left common carotid arteriogram followed by complete revascularization of symptomatic severe stenosis left ICA per interventional radiology on 03/14/2019.noted acute respiratory failure following revascularization procedure required intubation until 03/15/2019. Patient was initially maintained on pressors for low blood pressure that slowly improved and monitored with Lasix and Aldactone resumed. Follow-up neurology services no anti-thrombotic at this time given recent SDH and mildly elevated INR 1.9 with thrombocytopenia. Venous Dopplers lower extremities negative. Urine study grade 100,000 Klebsiella, 50,000 enterococcus completing a course of Augmentin .Her diet has been advanced to mechanical soft. Therapy evaluations completed and patient was admitted for a comprehensive rehabilitation program.   Patient states she has had frequent liquid stools today.  She  received lactulose.  She denies any abdominal pain but feels full.  She also feels gassy.  She indicates that she last had paracentesis in February.  Reviewed chart and she does have mild to moderate ascites as per abdominal ultrasound. Patient also states that she has irritable bowel syndrome.  She denies any nausea or vomiting.  Review of Systems  Constitutional: Negative for chills and fever.  HENT: Negative for hearing loss.   Eyes: Negative for blurred vision and double vision.  Respiratory: Negative for cough and shortness of breath.   Cardiovascular: Positive for leg swelling. Negative for chest pain and palpitations.  Gastrointestinal: Positive for constipation. Negative for nausea and vomiting.       GERD  Genitourinary: Negative for flank pain and hematuria.  Musculoskeletal: Positive for myalgias.  Skin: Negative for rash.  Neurological: Positive for dizziness, speech change and focal weakness.        Past Medical History:  Diagnosis Date   Cirrhosis ([place])     Diabetes mellitus without complication ([place])     Diabetic retinopathy ([place])     Diabetic retinopathy ([place])     GERD (gastroesophageal reflux disease)     High cholesterol     Hypertension     Patient is Jehovah's Witness     Sarcoidosis     Stroke ([place])     Subdural hematoma ([place])           Past Surgical History:  Procedure Laterality Date   CHOLECYSTECTOMY       KNEE SURGERY       RADIOLOGY WITH ANESTHESIA N/A 03/14/2019    Procedure: RADIOLOGY WITH ANESTHESIA;  Surgeon: Luanne Bras, MD;  Location: [facility];  Service: Radiology;  Laterality: N/A;         Family History  Problem Relation Age of Onset   Stroke Mother  Bladder Cancer Mother     CAD Father     Prostate cancer Father      Social History:  reports that she has quit smoking. She has never used smokeless tobacco. She reports that she does not drink alcohol or use drugs. Allergies:       Allergies  Allergen Reactions    Celecoxib Swelling      Ankles swell   Rofecoxib Swelling      Ankles swell   Heparin        Low risk HIT; Heparin Antibody = PENDING    Ciprofloxacin Itching   Darvon [Propoxyphene] Nausea And Vomiting   Hydrocodone-Acetaminophen Nausea And Vomiting   Morphine Nausea And Vomiting          Medications Prior to Admission  Medication Sig Dispense Refill   calcium carbonate (OS-CAL) 600 MG TABS tablet Take 600 mg by mouth 2 (two) times a day.       chlorthalidone (HYGROTON) 25 MG tablet Take 25 mg by mouth daily.        co-enzyme Q-10 30 MG capsule Take 30 mg by mouth 2 (two) times daily.       furosemide (LASIX) 40 MG tablet Take 40 mg by mouth 2 (two) times daily.        glipiZIDE (GLUCOTROL XL) 10 MG 24 hr tablet Take 10 mg by mouth 2 (two) times a day.        insulin lispro (HUMALOG KWIKPEN) 100 UNIT/ML KwikPen Inject 0-15 Units into the skin See admin instructions. MAX OF 50 UNITS/DAY  Based on sliding scale       lactulose (CHRONULAC) 10 GM/15ML solution Take 15 mLs (10 g total) by mouth 2 (two) times daily. (Patient taking differently: Take 10 g by mouth 2 (two) times daily as needed for mild constipation. ) 236 mL 0   liraglutide (VICTOZA) 18 MG/3ML SOPN Inject 1.8 mg into the skin at bedtime.       metFORMIN (GLUCOPHAGE) 500 MG tablet Take 500 mg by mouth 2 (two) times daily with a meal.        propranolol (INDERAL) 10 MG tablet Take 1 tablet (10 mg total) by mouth 2 (two) times daily. 60 tablet 0   spironolactone (ALDACTONE) 100 MG tablet Take 50 mg by mouth 2 (two) times a day.       spironolactone (ALDACTONE) 50 MG tablet Take 1 tablet (50 mg total) by mouth daily for 30 days. (Patient not taking: Reported on 03/15/2019) 30 tablet 0      Drug Regimen Review  Drug regimen was reviewed and remains appropriate with no significant issues identified   Home: Home Living Family/patient expects to be discharged to:: Private residence Living Arrangements:  Alone Available Help at Discharge: Family, Available PRN/intermittently Type of Home: House Home Access: Stairs to enter [employer] of Steps: 1 in front vs 5 in back Entrance Stairs-Rails: Right Home Layout: One level Bathroom Shower/Tub: Multimedia programmer: Standard Home Equipment: Environmental consultant - 2 wheels, Shower seat  Lives With: Alone   Functional History: Prior Function Level of Independence: Independent with assistive device(s) Comments: Uses RW for ambulation; drives. [place]chets at home   Functional Status:  Mobility: Bed Mobility Overal bed mobility: Needs Assistance Bed Mobility: Rolling, Sidelying to Sit Rolling: Min guard Sidelying to sit: Min assist, HOB elevated Sit to sidelying: Mod assist General bed mobility comments: Assist to elevate trunk to get to EOB, use of rail and increased time.  Transfers Overall  transfer level: Needs assistance Equipment used: Rolling walker (2 wheeled) Transfers: Sit to/from Stand Sit to Stand: Mod assist, +2 physical assistance, Min assist General transfer comment: Assist of 2 to power to standing initially with cues for use of momentum; stood from EOB x1, from [facility] x1, from chair x1. Ambulation/Gait Ambulation/Gait assistance: Min assist, +2 safety/equipment Gait Distance (Feet): 14 Feet(+15') Assistive device: Rolling walker (2 wheeled) Gait Pattern/deviations: Step-through pattern, Decreased stride length, Decreased step length - right, Wide base of support General Gait Details: Slow, mildly unsteady gait with decreased step length RLE, and decreased foot clearance RLE esp when fatigued. 1 seated rest break due to needing to have a BM. Gait velocity: decreased   ADL: ADL Overall ADL's : Needs assistance/impaired Eating/Feeding: NPO Grooming: Wash/dry hands, Wash/dry face, Oral care, Brushing hair, Moderate assistance, Sitting Upper Body Bathing: Maximal assistance, Sitting Lower Body Bathing: Maximal  assistance, Sit to/from stand Upper Body Dressing : Moderate assistance, Sitting Lower Body Dressing: Maximal assistance, Sit to/from stand Toilet Transfer: Moderate assistance, +2 for physical assistance, +2 for safety/equipment, BSC Toileting- Clothing Manipulation and Hygiene: Total assistance, Sit to/from stand Functional mobility during ADLs: Moderate assistance, +2 for physical assistance, +2 for safety/equipment General ADL Comments: Pt limited by weakness, lethargy and decreased attention    Cognition: Cognition Overall Cognitive Status: Impaired/Different from baseline Arousal/Alertness: Awake/alert Orientation Level: Oriented X4 Attention: Sustained Sustained Attention: Impaired Sustained Attention Impairment: Verbal complex Memory: Impaired Memory Impairment: Retrieval deficit Awareness: (questionable) Problem Solving: (suspects difficulty with high level) Safety/Judgment: Impaired Cognition Arousal/Alertness: Awake/alert Behavior During Therapy: WFL for tasks assessed/performed Overall Cognitive Status: Impaired/Different from baseline Area of Impairment: Orientation, Attention, Following commands, Safety/judgement, Awareness Orientation Level: Disoriented to, Situation Current Attention Level: Selective Following Commands: Follows one step commands with increased time, Follows one step commands consistently Safety/Judgement: Decreased awareness of deficits Awareness: Emergent Problem Solving: Slow processing, Decreased initiation, Difficulty sequencing, Requires verbal cues, Requires tactile cues General Comments: More alert and interactive today. 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 Wants to go home.   Physical Exam: Blood pressure (!) 125/51, pulse 67, temperature 97.6 F (36.4 C), temperature source Axillary, resp. rate 12, height 5' 3"  (1.6 m), weight 106.2 kg, SpO2 99 %. Physical Exam  Neurological:  Patient is alert. No acute distress. Follow simple  commands. Provides her name and age. Makes good eye contact with examiner.    General: No acute distress Mood and affect are appropriate Heart: Regular rate and rhythm no rubs murmurs or extra sounds Lungs: Clear to auscultation, breathing unlabored, no rales or wheezes Abdomen: Positive bowel sounds, distended diffusely but more so in the upper quadrants, mild diffuse tenderness, positive tympany Extremities: No clubbing, cyanosis, 1+ bilateral pretibial and pedal edema Skin: No evidence of breakdown, no evidence of rash Neurologic: Cranial nerves II through XII intact, motor strength is 4/5 in bilateral deltoid, bicep, tricep, grip,4- bilateral  hip flexor, knee extensors, ankle dorsiflexor and plantar flexor Sensory exam normal sensation to light touch and proprioception in bilateral upper and lower extremities Cerebellar exam normal finger to nose to finger as well as heel to shin in bilateral upper and lower extremities Musculoskeletal: Full range of motion in all 4 extremities. No joint swelling   Lab Results Last 48 Hours        Results for orders placed or performed during the hospital encounter of 03/14/19 (from the past 48 hour(s))  Glucose, capillary     Status: Abnormal    Collection Time: 03/15/19  8:05  AM  Result Value Ref Range    Glucose-Capillary 68 (L) 70 - 99 mg/dL    Comment 1 Notify RN      Comment 2 Document in Chart    Glucose, capillary     Status: Abnormal    Collection Time: 03/15/19  9:10 AM  Result Value Ref Range    Glucose-Capillary 113 (H) 70 - 99 mg/dL  Heparin induced platelet Ab (HIT antibody)     Status: None    Collection Time: 03/15/19 11:00 AM  Result Value Ref Range    Heparin Induced Plt Ab 0.074 0.000 - 0.400 OD      Comment: (NOTE) Performed At: [facility] [place], [place] [id number] Rush Farmer MD [id number]    Iron and TIBC     Status: Abnormal    Collection Time: 03/15/19 11:00 AM  Result Value Ref Range     Iron 96 28 - 170 ug/dL    TIBC 200 (L) 250 - 450 ug/dL    Saturation Ratios 48 (H) 10.4 - 31.8 %    UIBC 104 ug/dL      Comment: Performed at [facility], [place] [address], [place], [place] [address]  Ferritin     Status: None    Collection Time: 03/15/19 11:00 AM  Result Value Ref Range    Ferritin 236 11 - 307 ng/mL      Comment: Performed at [facility], [place] [address] [place], [place] [address]  Culture, respiratory     Status: None (Preliminary result)    Collection Time: 03/15/19 11:18 AM  Result Value Ref Range    Specimen Description TRACHEAL ASPIRATE      Special Requests Normal      Gram Stain          ABUNDANT WBC PRESENT,BOTH PMN AND MONONUCLEAR NO ORGANISMS SEEN      Culture          TOO YOUNG TO READ Performed at [facility], [place] [address], [place], [place] [address]      Report Status PENDING    Glucose, capillary     Status: Abnormal    Collection Time: 03/15/19 12:04 PM  Result Value Ref Range    Glucose-Capillary 53 (L) 70 - 99 mg/dL  Glucose, capillary     Status: Abnormal    Collection Time: 03/15/19 12:06 PM  Result Value Ref Range    Glucose-Capillary 45 (L) 70 - 99 mg/dL  Glucose, capillary     Status: None    Collection Time: 03/15/19 12:54 PM  Result Value Ref Range    Glucose-Capillary 73 70 - 99 mg/dL  Glucose, capillary     Status: None    Collection Time: 03/15/19  5:00 PM  Result Value Ref Range    Glucose-Capillary 86 70 - 99 mg/dL  Glucose, capillary     Status: None    Collection Time: 03/15/19  7:53 PM  Result Value Ref Range    Glucose-Capillary 77 70 - 99 mg/dL  Glucose, capillary     Status: None    Collection Time: 03/15/19 11:38 PM  Result Value Ref Range    Glucose-Capillary 81 70 - 99 mg/dL  Glucose, capillary     Status: None    Collection Time: 03/16/19  3:31 AM  Result Value Ref Range    Glucose-Capillary 91 70 - 99 mg/dL  CBC     Status: Abnormal    Collection Time: 03/16/19  4:26 AM  Result  Value Ref Range    WBC 8.9 4.0 - 10.5 K/uL    RBC 3.52 (L) 3.87 - 5.11 MIL/uL    Hemoglobin 8.9 (L) 12.0 - 15.0 g/dL      Comment: Reticulocyte Hemoglobin testing may be clinically indicated, consider ordering this additional test [id number]      HCT 24.4 (L) 36.0 - 46.0 %    MCV 69.3 (L) 80.0 - 100.0 fL    MCH 25.3 (L) 26.0 - 34.0 pg    MCHC 36.5 (H) 30.0 - 36.0 g/dL    RDW 15.5 11.5 - 15.5 %    Platelets 76 (L) 150 - 400 K/uL      Comment: REPEATED TO VERIFY Immature Platelet Fraction may be clinically indicated, consider ordering this additional test [id number] CONSISTENT WITH PREVIOUS RESULT      nRBC 0.2 0.0 - 0.2 %      Comment: Performed at [facility], [place] [address] [place], [place] [address]  Comprehensive metabolic panel     Status: Abnormal    Collection Time: 03/16/19  4:26 AM  Result Value Ref Range    Sodium 141 135 - 145 mmol/L      Comment: DELTA CHECK NOTED    Potassium 3.9 3.5 - 5.1 mmol/L    Chloride 108 98 - 111 mmol/L    CO2 18 (L) 22 - 32 mmol/L    Glucose, Bld 89 70 - 99 mg/dL    BUN 70 (H) 8 - 23 mg/dL    Creatinine, Ser 1.76 (H) 0.44 - 1.00 mg/dL    Calcium 8.5 (L) 8.9 - 10.3 mg/dL    Total Protein 5.6 (L) 6.5 - 8.1 g/dL    Albumin 2.6 (L) 3.5 - 5.0 g/dL    AST 92 (H) 15 - 41 U/L    ALT 87 (H) 0 - 44 U/L    Alkaline Phosphatase 153 (H) 38 - 126 U/L    Total Bilirubin 4.8 (H) 0.3 - 1.2 mg/dL    GFR calc non Af Amer 28 (L) >60 mL/min    GFR calc Af Amer 33 (L) >60 mL/min    Anion gap 15 5 - 15      Comment: Performed at [facility], [place] [address] [place], [place] [address]  Protime-INR     Status: Abnormal    Collection Time: 03/16/19  4:26 AM  Result Value Ref Range    Prothrombin Time 21.2 (H) 11.4 - 15.2 seconds    INR 1.9 (H) 0.8 - 1.2      Comment: (NOTE) INR goal varies based on device and disease states. Performed at [facility], [place] [address], [place], [place] [address]    Ammonia     Status: Abnormal     Collection Time: 03/16/19  4:26 AM  Result Value Ref Range    Ammonia 40 (H) 9 - 35 umol/L      Comment: Performed at [facility], [place] [address], [place], [place] [address]  Glucose, capillary     Status: None    Collection Time: 03/16/19  7:47 AM  Result Value Ref Range    Glucose-Capillary 84 70 - 99 mg/dL    Comment 1 Notify RN      Comment 2 Document in Chart    Glucose, capillary     Status: Abnormal    Collection Time: 03/16/19 11:43 AM  Result Value Ref Range    Glucose-Capillary  107 (H) 70 - 99 mg/dL    Comment 1 Notify RN      Comment 2 Document in Chart    Glucose, capillary     Status: Abnormal    Collection Time: 03/16/19  3:35 PM  Result Value Ref Range    Glucose-Capillary 204 (H) 70 - 99 mg/dL    Comment 1 Notify RN      Comment 2 Document in Chart    Glucose, capillary     Status: Abnormal    Collection Time: 03/16/19  8:02 PM  Result Value Ref Range    Glucose-Capillary 274 (H) 70 - 99 mg/dL  Glucose, capillary     Status: Abnormal    Collection Time: 03/16/19 11:47 PM  Result Value Ref Range    Glucose-Capillary 254 (H) 70 - 99 mg/dL  Glucose, capillary     Status: Abnormal    Collection Time: 03/17/19  3:43 AM  Result Value Ref Range    Glucose-Capillary 228 (H) 70 - 99 mg/dL  Protime-INR     Status: Abnormal    Collection Time: 03/17/19  5:05 AM  Result Value Ref Range    Prothrombin Time 19.4 (H) 11.4 - 15.2 seconds    INR 1.7 (H) 0.8 - 1.2      Comment: (NOTE) INR goal varies based on device and disease states. Performed at [facility], [place] [address], [place], [place] [address]    Comprehensive metabolic panel     Status: Abnormal    Collection Time: 03/17/19  5:05 AM  Result Value Ref Range    Sodium 140 135 - 145 mmol/L    Potassium 3.6 3.5 - 5.1 mmol/L    Chloride 104 98 - 111 mmol/L    CO2 20 (L) 22 - 32 mmol/L    Glucose, Bld 167 (H) 70 - 99 mg/dL    BUN 69 (H) 8 - 23 mg/dL    Creatinine, Ser 1.98 (H) 0.44 - 1.00 mg/dL     Calcium 8.7 (L) 8.9 - 10.3 mg/dL    Total Protein 5.9 (L) 6.5 - 8.1 g/dL    Albumin 2.6 (L) 3.5 - 5.0 g/dL    AST 85 (H) 15 - 41 U/L    ALT 89 (H) 0 - 44 U/L    Alkaline Phosphatase 173 (H) 38 - 126 U/L    Total Bilirubin 3.2 (H) 0.3 - 1.2 mg/dL    GFR calc non Af Amer 24 (L) >60 mL/min    GFR calc Af Amer 28 (L) >60 mL/min    Anion gap 16 (H) 5 - 15      Comment: Performed at [facility], [place] [address], [place], [place] [address]       Imaging Results (Last 48 hours)  Mr Brain Wo Contrast   Result Date: 03/15/2019 CLINICAL DATA:  Slurred speech with RIGHT-sided weakness and RIGHT gaze. Status post clot retrieval from the LEFT ICA. EXAM: MRI HEAD WITHOUT CONTRAST TECHNIQUE: Multiplanar, multiecho pulse sequences of the brain and surrounding structures were obtained without intravenous contrast. COMPARISON:  CTA head neck 03/14/2019. Catheter angiogram 03/14/2019. FINDINGS: The patient was unable to remain motionless for the exam. Small or subtle lesions could be overlooked. Brain: Small foci of acute infarction are seen in the LEFT hemisphere, MCA territory affecting the LEFT frontal and LEFT posterior frontal cortex and regional white matter. The largest area is located in the precentral gyrus, roughly 1 cm in size. No visible associated hemorrhage.  Additional areas of restricted diffusion in the RIGHT subdural space, most notably over the frontal lobe, represent the previously reported subdural hematoma. No significant mass effect from these collections, which measures only a few mm in thickness, maximal 5 mm over the RIGHT inferior frontal lobe. Elsewhere, generalized atrophy. Mild to moderate subcortical and periventricular T2 and FLAIR hyperintensities, likely chronic microvascular ischemic change. Vascular: Normal flow voids. Skull and upper cervical spine: Motion degraded, grossly negative. Sinuses/Orbits: Negative. Other: None. IMPRESSION: Small foci of acute infarction, LEFT MCA  territory affecting the frontal and posterior frontal cortex and white matter. See discussion above. No associated hemorrhage. Small volume subdural hematoma over the RIGHT convexity, non worrisome. Atrophy and small vessel disease. Flow voids demonstrate patency of the LEFT carotid system status post clot retrieval. Electronically Signed   By: Staci Righter M.D.   On: 03/15/2019 17:08    Vas [place] Lower Extremity Venous (dvt)   Result Date: 03/15/2019  Lower Venous Study Indications: Stroke, and Edema.  Limitations: Body habitus, poor ultrasound/tissue interface and poor patient cooperation; constant movement. Performing Technologist: Maudry Mayhew MHA, RDMS, RVT, RDCS  Examination Guidelines: A complete evaluation includes B-mode imaging, spectral Doppler, color Doppler, and power Doppler as needed of all accessible portions of each vessel. Bilateral testing is considered an integral part of a complete examination. Limited examinations for reoccurring indications may be performed as noted.  +---------+---------------+---------+-----------+----------+-------------------+  RIGHT     Compressibility Phasicity Spontaneity Properties Summary              +---------+---------------+---------+-----------+----------+-------------------+  CFV       Full                                                                  +---------+---------------+---------+-----------+----------+-------------------+  SFJ                                                        Not visualized       +---------+---------------+---------+-----------+----------+-------------------+  FV Prox   Full                                                                  +---------+---------------+---------+-----------+----------+-------------------+  FV Mid                                                     patent, limited  visualization         +---------+---------------+---------+-----------+----------+-------------------+  FV Distal                                                  Not visualized       +---------+---------------+---------+-----------+----------+-------------------+  POP       Full                                                                  +---------+---------------+---------+-----------+----------+-------------------+  PTV                                                        patent, limited                                                                  visualization        +---------+---------------+---------+-----------+----------+-------------------+  PERO                                                       Not visualized       +---------+---------------+---------+-----------+----------+-------------------+   +---------+---------------+---------+-----------+----------+--------------+  LEFT      Compressibility Phasicity Spontaneity Properties Summary         +---------+---------------+---------+-----------+----------+--------------+  CFV                                                        Not visualized  +---------+---------------+---------+-----------+----------+--------------+  SFJ                                                        Not visualized  +---------+---------------+---------+-----------+----------+--------------+  FV Prox   Full                                                             +---------+---------------+---------+-----------+----------+--------------+  FV Mid  Not visualized  +---------+---------------+---------+-----------+----------+--------------+  FV Distal                                                  Not visualized  +---------+---------------+---------+-----------+----------+--------------+  PFV                                                        Not visualized   +---------+---------------+---------+-----------+----------+--------------+  POP       Full            Yes       Yes                                    +---------+---------------+---------+-----------+----------+--------------+  PTV                                                        Not visualized  +---------+---------------+---------+-----------+----------+--------------+  PERO                                                       Not visualized  +---------+---------------+---------+-----------+----------+--------------+     Summary: Right: There is no evidence of deep vein thrombosis in the lower extremity. However, portions of this examination were limited- see technologist comments above. No cystic structure found in the popliteal fossa. Left: There is no evidence of deep vein thrombosis in the lower extremity. However, portions of this examination were limited- see technologist comments above. No cystic structure found in the popliteal fossa.  *See table(s) above for measurements and observations. Electronically signed by Ruta Hinds MD on 03/15/2019 at 6:41:43 PM.    Final     [place] Ekg Site Rite   Result Date: 03/16/2019 If Site Rite image not attached, placement could not be confirmed due to current cardiac rhythm.            Medical Problem List and Plan: 1.  Right side weakness and slurred speech secondary to left MCA infarction status post IR left ICA near occlusive thrombus/revascularization as well as history of recent subdural hematoma January 2020.Marland Kitchen No anti-thrombotic for now given recent SDH and elevated INR per neurology services. Rehab evals in am 2.  Antithrombotics: -DVT/anticoagulation:  Venous Dopplers negative SCDs             -antiplatelet therapy: N/A 3. Pain Management:  Tylenol as needed 4. Mood:  Provide emotional support             -antipsychotic agents: N/A 5. Neuropsych: This patient is capable of making decisions on her own behalf. 6. Skin/Wound Care:  Routine  skin checks 7. Fluids/Electrolytes/Nutrition:  Routine in the mouth with follow-up chemistries 8. Acute respiratory failure after mechanical thrombectomy. Extubated 03/15/2019 9. Uncompensated cirrhosis. Continue Chronulac. 10 twice daily.Latest ammonia level XL 10. Hypertension.Lasix 40 mg daily,  Aldactone 12.5 mg daily have been resumed. Monitor with increased mobility.. 11. Diabetes mellitus. SSI. Hemoglobin A1c 6.2.NovoLog 5 units 3 times a day. Patient on Glucotrol 10 mg twice a day, Glucophage 500 mg twice a day, Victoza 1.8 mg daily at bedtime prior to admission. Resume as needed 12. Hyperlipidemia. No statin at this time due to cirrhosis 13. Klebsiella and enterococcus UTI. Continue Augmentin through 03/22/2019 and stop 14. CKD stage III. Follow-up chemistries 15.  Abdominal distention in a patient with [name] and cirrhosis.  She has no nausea or vomiting.  She has had multiple stools.  This may be a side effect of her lactulose.  Will check KUB, will also check serum ammonia level, lactulose may need to be held for several days  Post Admission Physician Evaluation: 1. Functional deficits secondary  to Left MCA infarct. 2. Patient admitted to receive collaborative, interdisciplinary care between the physiatrist, rehab nursing staff, and therapy team. 3. Patient's level of medical complexity and substantial therapy needs in context of that medical necessity cannot be provided at a lesser intensity of care. 4. Patient has experienced substantial functional loss from his/her baseline. Judging by the patient's diagnosis, physical exam, and functional history, the patient has potential for functional progress which will result in measurable gains while on inpatient rehab.  These gains will be of substantial and practical use upon discharge in facilitating mobility and self-care at the household level. 5. Physiatrist will provide 24 hour management of medical needs as well as oversight of the  therapy plan/treatment and provide guidance as appropriate regarding the interaction of the two. 6. 24 hour rehab nursing will assist in the management of  bladder management, bowel management, safety, skin/wound care, disease management, medication administration, pain management and patient education  and help integrate therapy concepts, techniques,education, etc. 7. PT will assess and treat for pre gait, gait training, endurance , safety, equipment, neuromuscular re education:  .  Goals are: independent with assistive device. 8. OT will assess and treat for  .ADLs, Cognitive perceptual skills, Neuromuscular re education, safety, endurance, equipment  Goals are: supervision.  9. SLP will assess and treat forNA  .  Goals are: N/A. 10. Case Management and Social Worker will assess and treat for psychological issues and discharge planning. 11. Team conference will be held weekly to assess progress toward goals and to determine barriers to discharge. 12.  Patient will receive at least 3 hours of therapy per day at least 5 days per week. 13. ELOS and Prognosis: 10-15d good   "I have personally performed a face to face diagnostic evaluation of this patient.  Additionally, I have reviewed and concur with the physician assistant's documentation above." Charlett Blake M.D. [employer] FAAPM&R (Sports Med, Neuromuscular Med) Diplomate [place] Board of Electrodiagnostic Med    Elizabeth Sauer 03/17/2019

## 2019-03-18 NOTE — Progress Notes (Signed)
Patient off pressors. PCCM will sign off

## 2019-03-18 NOTE — Progress Notes (Signed)
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  Per chart review, patient and nursing, patient lives alone. Independent with a rolling walker prior to admission. She still drives. One level home. She has a son in the area of the checks on her regularly. Presented 03/14/2019 with right-sided weakness and slurred speech. Cranial CT scan reviewed by neurology services suspect left MCA infarction.. Minimal right convexity subdural blood without acute hemorrhage. Patient did not receive TPA. Echocardiogram with ejection fraction of 65%, hyperdynamic systolic function.Lower extremity Dopplers negative for DVT. 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 Patient underwent left common carotid arteriogram followed by complete revascularization of symptomatic severe stenosis left ICA per interventional radiology 03/14/2019. Patient was on pressors for low blood pressure. Follow-up MRI reviewed, showing small left MCA infarct. Patient is currently NPO. Patient was extubated 03/15/2019 and therapy evaluation completed.M.D. has requested physical medicine rehabilitation consult.   Review of Systems  Constitutional: Negative for chills and fever.  HENT: Negative for hearing loss.   Eyes: Negative for blurred vision and double vision.  Respiratory: Negative for  shortness of breath.   Cardiovascular: Positive for leg swelling. Negative for palpitations.  Gastrointestinal: Positive for constipation. Negative for heartburn, nausea and vomiting.       GERD  Genitourinary: Negative for dysuria, flank pain and hematuria.  Musculoskeletal: Positive for myalgias.  Skin: Negative for rash.  Neurological: Positive for dizziness, speech change and focal weakness.  All other systems reviewed and are negative.      Past Medical History:  Diagnosis Date   Cirrhosis ([place])    Diabetes mellitus without complication ([place])    Diabetic retinopathy ([place])    Diabetic retinopathy ([place])    GERD (gastroesophageal reflux disease)    High cholesterol    Hypertension    Patient is Jehovah's Witness    Sarcoidosis    Stroke ([place])    Subdural hematoma ([place])         Past Surgical History:  Procedure Laterality Date   CHOLECYSTECTOMY     KNEE SURGERY     RADIOLOGY WITH ANESTHESIA N/A 03/14/2019   Procedure: RADIOLOGY WITH ANESTHESIA;  Surgeon: Luanne Bras, MD;  Location: [facility];  Service: Radiology;  Laterality: N/A;        Family History  Problem Relation Age of Onset   Stroke Mother    Bladder Cancer Mother    CAD Father    Prostate cancer Father    Social History:  reports that she has quit smoking. She has never used smokeless tobacco. She reports that she does not drink alcohol or use drugs. 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      chlorthalidone (HYGROTON) 25 MG tablet Take 25 mg by mouth daily.      co-enzyme Q-10 30 MG capsule Take 30 mg by mouth 2 (two) times daily.     furosemide (LASIX) 40 MG tablet Take 40 mg by mouth 2 (two) times daily.      glipiZIDE (GLUCOTROL XL) 10 MG 24 hr tablet Take 10 mg by mouth 2 (two) times a day.      insulin lispro (HUMALOG KWIKPEN) 100 UNIT/ML KwikPen Inject 0-15 Units into the skin See admin instructions. MAX OF 50 UNITS/DAY  Based on sliding scale     lactulose (CHRONULAC) 10 GM/15ML solution Take 15 mLs (10 g total) by mouth 2 (two) times daily. (Patient taking differently: Take 10 g by mouth 2 (two) times daily as needed for mild constipation. ) 236 mL 0   liraglutide (VICTOZA) 18 MG/3ML SOPN Inject 1.8 mg into the skin at bedtime.     metFORMIN (GLUCOPHAGE) 500 MG tablet Take 500 mg by mouth 2 (two) times daily with a meal.      propranolol (INDERAL) 10 MG tablet Take 1 tablet (10 mg total) by mouth 2 (two) times daily. 60 tablet 0   spironolactone (ALDACTONE) 100 MG tablet Take 50 mg by mouth 2 (two) times a day.     spironolactone (ALDACTONE) 50 MG tablet Take 1 tablet (50 mg total) by mouth daily for 30 days. (Patient not taking: Reported on 03/15/2019) 30 tablet 0    Home: [place] expects to be discharged to:: Private residence Living Arrangements: Alone Available Help at Discharge: Family, Available PRN/intermittently Type of Home: House Home Access: Stairs to enter [employer] of Steps: 1 in front vs 5 in back Entrance Stairs-Rails: Right Home Layout: One level Bathroom Shower/Tub: Multimedia programmer: Standard Home Equipment: Environmental consultant - 2 wheels, Shower seat  Functional History: Prior Function Level of Independence: Independent with assistive device(s) Comments: Uses RW for ambulation; drives. [place] at home Functional Status:  Mobility: Bed Mobility Overal bed mobility: Needs Assistance Bed  Mobility: Rolling, Sidelying to Sit, Sit to Sidelying Rolling: Max assist, +2 for physical assistance Sidelying to sit: Max assist, +2 for physical assistance Sit to sidelying: Mod assist General bed mobility comments: ASsist with LEs, bottom and to elevate trunk to get to EOB. Needed assist to bring LEs to return to supine. Transfers Overall transfer level: Needs assistance Equipment used: 2 person hand held assist Transfers: Sit to/from Stand Sit to Stand: Mod assist, +2 physical assistance General transfer comment: Assist to power to standing with cues for forward weight shift; RLE initially locked out into genu recurvatum.  Ambulation/Gait Ambulation/Gait assistance: Mod assist, +2 physical assistance Gait Distance (Feet): 5 Feet Assistive device: 2 person hand held assist Gait Pattern/deviations: Step-to pattern, Wide base of support, Decreased weight shift to right, Decreased weight shift to left General Gait Details: Able to take side steps along side bed with Mod A of 2, assist for weight shifting, advancing RLE and for balance. Stabilizing right knee due to instability.  Gait velocity: decreased  ADL: ADL Overall ADL's : Needs assistance/impaired Eating/Feeding: NPO Grooming:  Wash/dry hands, Wash/dry face, Oral care, Brushing hair, Moderate assistance, Sitting Upper Body Bathing: Maximal assistance, Sitting Lower Body Bathing: Maximal assistance, Sit to/from stand Upper Body Dressing : Moderate assistance, Sitting Lower Body Dressing: Maximal assistance, Sit to/from stand Toilet Transfer: Moderate assistance, +2 for physical assistance, +2 for safety/equipment, BSC Toileting- Clothing Manipulation and Hygiene: Total assistance, Sit to/from stand Functional mobility during ADLs: Moderate assistance, +2 for physical assistance, +2 for safety/equipment General ADL Comments: Pt limited by weakness, lethargy and decreased attention   Cognition: Cognition Overall Cognitive  Status: Impaired/Different from baseline Orientation Level: Oriented X4 Cognition Arousal/Alertness: Lethargic Behavior During Therapy: WFL for tasks assessed/performed Overall Cognitive Status: Impaired/Different from baseline Area of Impairment: Orientation, Attention, Following commands, Problem solving, Safety/judgement Orientation Level: Disoriented to, Situation Current Attention Level: Focused Following Commands: Follows one step commands with increased time(multimodal cues ) Safety/Judgement: Decreased awareness of safety, Decreased awareness of deficits Problem Solving: Slow processing, Decreased initiation, Difficulty sequencing, Requires verbal cues, Requires tactile cues General Comments: Lethargic during session needing repetition to answer questions. Knows it is April but needs options to state the year. When asked why she was in the hospital, "the hashbrowns" and then laughs. not able to state situation. I  Blood pressure (!) 92/53, pulse 70, temperature 97.6 F (36.4 C), temperature source Oral, resp. rate 13, height 5' 3"  (1.6 m), weight 106.2 kg, SpO2 95 %. Physical Exam  Vitals reviewed. Constitutional: She appears well-developed.  Morbidly obese  HENT:  Head: Normocephalic and atraumatic.  Poor dentition  Eyes: EOM are normal. Right eye exhibits no discharge. Left eye exhibits no discharge.  Respiratory: Effort normal.  GI: She exhibits no distension.  Musculoskeletal:     Comments: No edema or tenderness in extremities  Neurological: She is alert.  She makes eye contact with examiner.  Followed simple commands.  Provides her name and age. Motor: LUE/LLE: 5/5 proximal distal RUE/RLE: 4+/5 proximal to distal  Skin: Skin is warm and dry.  Psychiatric: Her speech is delayed. She is slowed.    LabResultsLast24Hours        Results for orders placed or performed during the hospital encounter of 03/14/19 (from the past 24 hour(s))  Blood gas, arterial      Status: Abnormal   Collection Time: 03/15/19  6:00 AM  Result Value Ref Range   FIO2 40.00    Delivery systems VENTILATOR    Mode PRESSURE REGULATED VOLUME CONTROL    VT 420 mL   LHR 15 resp/min   Peep/cpap 5.0 cm H20   pH, Arterial 7.443 7.350 - 7.450   pCO2 arterial 32.3 32.0 - 48.0 mmHg   pO2, Arterial 163 (H) 83.0 - 108.0 mmHg   Bicarbonate 21.8 20.0 - 28.0 mmol/L   Acid-base deficit 1.8 0.0 - 2.0 mmol/L   O2 Saturation 99.2 %   Patient temperature 98.6    Collection site A-LINE    Drawn by [id number]    Sample type ARTERIAL DRAW   Glucose, capillary     Status: Abnormal   Collection Time: 03/15/19  8:05 AM  Result Value Ref Range   Glucose-Capillary 68 (L) 70 - 99 mg/dL   Comment 1 Notify RN    Comment 2 Document in Chart   Glucose, capillary     Status: Abnormal   Collection Time: 03/15/19  9:10 AM  Result Value Ref Range   Glucose-Capillary 113 (H) 70 - 99 mg/dL  Iron and TIBC     Status: Abnormal   Collection Time:  03/15/19 11:00 AM  Result Value Ref Range   Iron 96 28 - 170 ug/dL   TIBC 200 (L) 250 - 450 ug/dL   Saturation Ratios 48 (H) 10.4 - 31.8 %   UIBC 104 ug/dL  Ferritin     Status: None   Collection Time: 03/15/19 11:00 AM  Result Value Ref Range   Ferritin 236 11 - 307 ng/mL  Culture, respiratory     Status: None (Preliminary result)   Collection Time: 03/15/19 11:18 AM  Result Value Ref Range   Specimen Description TRACHEAL ASPIRATE    Special Requests Normal    Gram Stain      ABUNDANT WBC PRESENT,BOTH PMN AND MONONUCLEAR NO ORGANISMS SEEN Performed at [facility], [place] [address] [place], [place] [address]    Culture PENDING    Report Status PENDING   Glucose, capillary     Status: Abnormal   Collection Time: 03/15/19 12:04 PM  Result Value Ref Range   Glucose-Capillary 53 (L) 70 - 99 mg/dL  Glucose, capillary     Status: Abnormal   Collection Time: 03/15/19 12:06 PM  Result Value  Ref Range   Glucose-Capillary 45 (L) 70 - 99 mg/dL  Glucose, capillary     Status: None   Collection Time: 03/15/19 12:54 PM  Result Value Ref Range   Glucose-Capillary 73 70 - 99 mg/dL  Glucose, capillary     Status: None   Collection Time: 03/15/19  5:00 PM  Result Value Ref Range   Glucose-Capillary 86 70 - 99 mg/dL  Glucose, capillary     Status: None   Collection Time: 03/15/19  7:53 PM  Result Value Ref Range   Glucose-Capillary 77 70 - 99 mg/dL  Glucose, capillary     Status: None   Collection Time: 03/15/19 11:38 PM  Result Value Ref Range   Glucose-Capillary 81 70 - 99 mg/dL  Glucose, capillary     Status: None   Collection Time: 03/16/19  3:31 AM  Result Value Ref Range   Glucose-Capillary 91 70 - 99 mg/dL  Protime-INR     Status: Abnormal   Collection Time: 03/16/19  4:26 AM  Result Value Ref Range   Prothrombin Time 21.2 (H) 11.4 - 15.2 seconds   INR 1.9 (H) 0.8 - 1.2      ImagingResults(Last48hours)  Ct Angio Head W Or Wo Contrast  Result Date: 03/14/2019 CLINICAL DATA:  Right-sided deficits EXAM: CT ANGIOGRAPHY HEAD AND NECK TECHNIQUE: Multidetector CT imaging of the head and neck was performed using the standard protocol during bolus administration of intravenous contrast. Multiplanar CT image reconstructions and MIPs were obtained to evaluate the vascular anatomy. Carotid stenosis measurements (when applicable) are obtained utilizing NASCET criteria, using the distal internal carotid diameter as the denominator. CONTRAST:  17m OMNIPAQUE IOHEXOL 350 MG/ML SOLN COMPARISON:  Head CT 03/14/2019 FINDINGS: CTA NECK FINDINGS SKELETON: There is no bony spinal canal stenosis. No lytic or blastic lesion. OTHER NECK: Normal pharynx, larynx and major salivary glands. No cervical lymphadenopathy. Unremarkable thyroid gland. UPPER CHEST: No pneumothorax or pleural effusion. No nodules or masses. AORTIC ARCH: There is no calcific atherosclerosis of the aortic  arch. There is no aneurysm, dissection or hemodynamically significant stenosis of the visualized ascending aorta and aortic arch. Conventional 3 vessel aortic branching pattern. The visualized proximal subclavian arteries are widely patent. RIGHT CAROTID SYSTEM: --Common carotid artery: Widely patent origin without common carotid artery dissection or aneurysm. --Internal carotid artery: Normal without aneurysm, dissection or  stenosis. --External carotid artery: No acute abnormality. LEFT CAROTID SYSTEM: --Common carotid artery: Widely patent origin without common carotid artery dissection or aneurysm. --Internal carotid artery: Qualitative narrowing of the proximal ICA, which otherwise remains patent to the skull base. There is low-attenuation thrombus within the proximal left ICA causing severe --External carotid artery: No acute abnormality. VERTEBRAL ARTERIES: Left dominant configuration. Both origins are normal. No dissection, occlusion or flow-limiting stenosis to the vertebrobasilar confluence. CTA HEAD FINDINGS POSTERIOR CIRCULATION: --Vertebral arteries: Normal codominant configuration of V4 segments. --Posterior inferior cerebellar arteries (PICA): Patent origins from the vertebral arteries. --Anterior inferior cerebellar arteries (AICA): The right AICA originates normally from the basilar artery. Left AICA not clearly visualized. --Basilar artery: Normal. --Superior cerebellar arteries: Normal. --Posterior cerebral arteries (PCA): Normal. Both originate from the basilar artery. Posterior communicating arteries (p-comm) are diminutive . ANTERIOR CIRCULATION: --Intracranial internal carotid arteries: Normal. --Anterior cerebral arteries (ACA): Normal. Both A1 segments are present. Patent anterior communicating artery (a-comm). --Middle cerebral arteries (MCA): Normal. VENOUS SINUSES: As permitted by contrast timing, patent. ANATOMIC VARIANTS: None Review of the MIP images confirms the above findings.  IMPRESSION: 1. 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 This causes severe qualitative narrowing of proximal ICA. This be a source for small downstream emboli. 2. No intracranial occlusion or stenosis. 3. These results were called by telephone at the time of interpretation on 03/14/2019 at 8:23 pm to Dr. Kerney Elbe , who verbally acknowledged these results. Electronically Signed   By: Ulyses Jarred M.D.   On: 03/14/2019 20:23   Ct Angio Neck W Or Wo Contrast  Result Date: 03/14/2019 CLINICAL DATA:  Right-sided deficits EXAM: CT ANGIOGRAPHY HEAD AND NECK TECHNIQUE: Multidetector CT imaging of the head and neck was performed using the standard protocol during bolus administration of intravenous contrast. Multiplanar CT image reconstructions and MIPs were obtained to evaluate the vascular anatomy. Carotid stenosis measurements (when applicable) are obtained utilizing NASCET criteria, using the distal internal carotid diameter as the denominator. CONTRAST:  33m OMNIPAQUE IOHEXOL 350 MG/ML SOLN COMPARISON:  Head CT 03/14/2019 FINDINGS: CTA NECK FINDINGS SKELETON: There is no bony spinal canal stenosis. No lytic or blastic lesion. OTHER NECK: Normal pharynx, larynx and major salivary glands. No cervical lymphadenopathy. Unremarkable thyroid gland. UPPER CHEST: No pneumothorax or pleural effusion. No nodules or masses. AORTIC ARCH: There is no calcific atherosclerosis of the aortic arch. There is no aneurysm, dissection or hemodynamically significant stenosis of the visualized ascending aorta and aortic arch. Conventional 3 vessel aortic branching pattern. The visualized proximal subclavian arteries are widely patent. RIGHT CAROTID SYSTEM: --Common carotid artery: Widely patent origin without common carotid artery dissection or aneurysm. --Internal carotid artery: Normal without aneurysm, dissection or stenosis. --External carotid artery: No acute  abnormality. LEFT CAROTID SYSTEM: --Common carotid artery: Widely patent origin without common carotid artery dissection or aneurysm. --Internal carotid artery: Qualitative narrowing of the proximal ICA, which otherwise remains patent to the skull base. There is low-attenuation thrombus within the proximal left ICA causing severe --External carotid artery: No acute abnormality. VERTEBRAL ARTERIES: Left dominant configuration. Both origins are normal. No dissection, occlusion or flow-limiting stenosis to the vertebrobasilar confluence. CTA HEAD FINDINGS POSTERIOR CIRCULATION: --Vertebral arteries: Normal codominant configuration of V4 segments. --Posterior inferior cerebellar arteries (PICA): Patent origins from the vertebral arteries. --Anterior inferior cerebellar arteries (AICA): The right AICA originates normally from the basilar artery. Left AICA not clearly visualized. --Basilar artery: Normal. --Superior cerebellar arteries: Normal. --Posterior  cerebral arteries (PCA): Normal. Both originate from the basilar artery. Posterior communicating arteries (p-comm) are diminutive . ANTERIOR CIRCULATION: --Intracranial internal carotid arteries: Normal. --Anterior cerebral arteries (ACA): Normal. Both A1 segments are present. Patent anterior communicating artery (a-comm). --Middle cerebral arteries (MCA): Normal. VENOUS SINUSES: As permitted by contrast timing, patent. ANATOMIC VARIANTS: None Review of the MIP images confirms the above findings. IMPRESSION: 1. 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 This causes severe qualitative narrowing of proximal ICA. This be a source for small downstream emboli. 2. No intracranial occlusion or stenosis. 3. These results were called by telephone at the time of interpretation on 03/14/2019 at 8:23 pm to Dr. Kerney Elbe , who verbally acknowledged these results. Electronically Signed   By: Ulyses Jarred M.D.   On:  03/14/2019 20:23   Mr Brain Wo Contrast  Result Date: 03/15/2019 CLINICAL DATA:  Slurred speech with RIGHT-sided weakness and RIGHT gaze. Status post clot retrieval from the LEFT ICA. EXAM: MRI HEAD WITHOUT CONTRAST TECHNIQUE: Multiplanar, multiecho pulse sequences of the brain and surrounding structures were obtained without intravenous contrast. COMPARISON:  CTA head neck 03/14/2019. Catheter angiogram 03/14/2019. FINDINGS: The patient was unable to remain motionless for the exam. Small or subtle lesions could be overlooked. Brain: Small foci of acute infarction are seen in the LEFT hemisphere, MCA territory affecting the LEFT frontal and LEFT posterior frontal cortex and regional white matter. The largest area is located in the precentral gyrus, roughly 1 cm in size. No visible associated hemorrhage. Additional areas of restricted diffusion in the RIGHT subdural space, most notably over the frontal lobe, represent the previously reported subdural hematoma. No significant mass effect from these collections, which measures only a few mm in thickness, maximal 5 mm over the RIGHT inferior frontal lobe. Elsewhere, generalized atrophy. Mild to moderate subcortical and periventricular T2 and FLAIR hyperintensities, likely chronic microvascular ischemic change. Vascular: Normal flow voids. Skull and upper cervical spine: Motion degraded, grossly negative. Sinuses/Orbits: Negative. Other: None. IMPRESSION: Small foci of acute infarction, LEFT MCA territory affecting the frontal and posterior frontal cortex and white matter. See discussion above. No associated hemorrhage. Small volume subdural hematoma over the RIGHT convexity, non worrisome. Atrophy and small vessel disease. Flow voids demonstrate patency of the LEFT carotid system status post clot retrieval. Electronically Signed   By: Staci Righter M.D.   On: 03/15/2019 17:08   Dg Chest Port 1 View  Result Date: 03/15/2019 CLINICAL DATA:  Check endotracheal  tube placement EXAM: PORTABLE CHEST 1 VIEW COMPARISON:  03/14/2019 FINDINGS: Cardiac shadow is within normal limits. Endotracheal tube and nasogastric catheter are noted in satisfactory position. The overall inspiratory effort is poor with right basilar atelectasis. No bony abnormality is noted. IMPRESSION: Right basilar atelectasis is seen. Tubes and lines as described. Electronically Signed   By: Inez Catalina M.D.   On: 03/15/2019 08:06   Dg Chest Portable 1 View  Result Date: 03/14/2019 CLINICAL DATA:  Intubation, OG tube placement EXAM: PORTABLE CHEST 1 VIEW COMPARISON:  03/02/2019 FINDINGS: Endotracheal tube is just into the right mainstem bronchus. Recommend retracting approximately 2-3 cm. NG tube tip is in the distal esophagus. Low lung volumes with bibasilar atelectasis. No effusions or acute bony abnormality. IMPRESSION: Right mainstem intubation. Recommend retracting 2-3 cm. NG tube tip in the distal esophagus. Low lung volumes with bibasilar atelectasis. These results were called by telephone at the time of interpretation on 03/14/2019 at 9:19 pm to Dr.  BRIAN MILLER , who verbally acknowledged these results. Electronically Signed   By: Rolm Baptise M.D.   On: 03/14/2019 21:22   Dg Abd Portable 1 View  Result Date: 03/14/2019 CLINICAL DATA:  OG tube EXAM: PORTABLE ABDOMEN - 1 VIEW COMPARISON:  Chest x-ray today FINDINGS: OG tube tip is in the distal esophagus, better seen on chest x-ray. Nonobstructive bowel gas pattern. Prior cholecystectomy. IMPRESSION: OG tube tip in the distal esophagus, better seen on chest x-ray. Recommend advancing. Electronically Signed   By: Rolm Baptise M.D.   On: 03/14/2019 21:22   Ct Head Code Stroke Wo Contrast  Result Date: 03/14/2019 CLINICAL DATA:  Code stroke. Slurred speech and right-sided deficits EXAM: CT HEAD WITHOUT CONTRAST TECHNIQUE: Contiguous axial images were obtained from the base of the skull through the vertex without intravenous contrast.  COMPARISON:  Head CT 02/11/2019 FINDINGS: Brain: Minimal residual right convexity subdural blood, unchanged compared to 02/11/2019. No acute hemorrhage. The size and configuration of the ventricles and extra-axial CSF spaces are normal. There is hypoattenuation of the periventricular white matter, most commonly indicating chronic ischemic microangiopathy. Vascular: No abnormal hyperdensity of the major intracranial arteries or dural venous sinuses. No intracranial atherosclerosis. Skull: The visualized skull base, calvarium and extracranial soft tissues are normal. Sinuses/Orbits: No fluid levels or advanced mucosal thickening of the visualized paranasal sinuses. No mastoid or middle ear effusion. The orbits are normal. ASPECTS [facility] Stroke Program Early CT Score) - Ganglionic level infarction (caudate, lentiform nuclei, internal capsule, insula, M1-M3 cortex): 7 - Supraganglionic infarction (M4-M6 cortex): 3 Total score (0-10 with 10 being normal): 10 IMPRESSION: 1. Unchanged appearance of minimal residual right convexity subdural blood without acute hemorrhage. 2. ASPECTS is 10. 3. These results were communicated to Dr. Kerney Elbe at 7:50 pm on 03/14/2019 by text page via the [facility] messaging system. Electronically Signed   By: Ulyses Jarred M.D.   On: 03/14/2019 19:51   Vas [place] Lower Extremity Venous (dvt)  Result Date: 03/15/2019  Lower Venous Study Indications: Stroke, and Edema.  Limitations: Body habitus, poor ultrasound/tissue interface and poor patient cooperation; constant movement. Performing Technologist: Maudry Mayhew MHA, RDMS, RVT, RDCS  Examination Guidelines: A complete evaluation includes B-mode imaging, spectral Doppler, color Doppler, and power Doppler as needed of all accessible portions of each vessel. Bilateral testing is considered an integral part of a complete examination. Limited examinations for reoccurring indications may be performed as noted.   +---------+---------------+---------+-----------+----------+-------------------+  RIGHT     Compressibility Phasicity Spontaneity Properties Summary              +---------+---------------+---------+-----------+----------+-------------------+  CFV       Full                                                                  +---------+---------------+---------+-----------+----------+-------------------+  SFJ                                                        Not visualized       +---------+---------------+---------+-----------+----------+-------------------+  FV Prox   Full                                                                  +---------+---------------+---------+-----------+----------+-------------------+  FV Mid                                                     patent, limited                                                                  visualization        +---------+---------------+---------+-----------+----------+-------------------+  FV Distal                                                  Not visualized       +---------+---------------+---------+-----------+----------+-------------------+  POP       Full                                                                  +---------+---------------+---------+-----------+----------+-------------------+  PTV                                                        patent, limited                                                                  visualization        +---------+---------------+---------+-----------+----------+-------------------+  PERO                                                       Not visualized       +---------+---------------+---------+-----------+----------+-------------------+   +---------+---------------+---------+-----------+----------+--------------+  LEFT      Compressibility Phasicity Spontaneity Properties Summary         +---------+---------------+---------+-----------+----------+--------------+  CFV                                                         Not visualized  +---------+---------------+---------+-----------+----------+--------------+  SFJ  Not visualized  +---------+---------------+---------+-----------+----------+--------------+  FV Prox   Full                                                             +---------+---------------+---------+-----------+----------+--------------+  FV Mid                                                     Not visualized  +---------+---------------+---------+-----------+----------+--------------+  FV Distal                                                  Not visualized  +---------+---------------+---------+-----------+----------+--------------+  PFV                                                        Not visualized  +---------+---------------+---------+-----------+----------+--------------+  POP       Full            Yes       Yes                                    +---------+---------------+---------+-----------+----------+--------------+  PTV                                                        Not visualized  +---------+---------------+---------+-----------+----------+--------------+  PERO                                                       Not visualized  +---------+---------------+---------+-----------+----------+--------------+     Summary: Right: There is no evidence of deep vein thrombosis in the lower extremity. However, portions of this examination were limited- see technologist comments above. No cystic structure found in the popliteal fossa. Left: There is no evidence of deep vein thrombosis in the lower extremity. However, portions of this examination were limited- see technologist comments above. No cystic structure found in the popliteal fossa.  *See table(s) above for measurements and observations. Electronically signed by Ruta Hinds MD on 03/15/2019 at 6:41:43 PM.    Final       Assessment/Plan: Diagnosis: Left MCA infarct Labs and images (see above) independently reviewed.  Records reviewed and summated above.  1. Does the need for close, 24 hr/day medical supervision in concert with the patient's rehab needs make it unreasonable for this patient to be served in a less intensive setting? Yes  2. 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 3. Due to safety, disease management, medication administration and patient education, does the patient require 24 hr/day rehab nursing? Yes 4. Does the patient require coordinated care of a physician, rehab nurse, PT (1-2 hrs/day, 5 days/week), OT (1-2 hrs/day, 5 days/week) and SLP (1-2 hrs/day, 5 days/week) to address physical and functional deficits in the context of the above medical diagnosis(es)? Yes Addressing deficits in the following areas: balance, endurance, locomotion, strength, transferring, bathing, dressing, toileting, cognition, swallowing and psychosocial support 5. Can the patient actively participate in an intensive therapy program of at least 3 hrs of therapy per day at least 5 days per week? Yes 6. The potential for patient to make measurable gains while on inpatient rehab is excellent 7. Anticipated functional outcomes upon discharge from inpatient rehab are modified independent and supervision  with PT, modified independent and supervision with OT, modified independent with SLP. 8. Estimated rehab length of stay to reach the above functional goals is: 10-15 days. 9. Anticipated D/C setting: Home 10. Anticipated post D/C treatments: HH therapy and Home  excercise program 11. Overall Rehab/Functional Prognosis: good  RECOMMENDATIONS: This patient's condition is appropriate for continued rehabilitative care in the following setting: CIR when medically stable. Patient has agreed to participate in recommended program. Appears reluctant, but agreeable. Note that insurance prior authorization may be required for reimbursement for recommended care.  Comment: Rehab Admissions Coordinator to follow up.   I have personally performed a face to face diagnostic evaluation, including, but not limited to relevant history and physical exam findings, of this patient and developed relevant assessment and plan.  Additionally, I have reviewed and concur with the physician assistant's documentation above.   Delice Lesch, MD, ABPMR Lavon Paganini Angiulli, PA-C 03/16/2019        Revision History                   Routing History

## 2019-03-18 NOTE — Anesthesia Postprocedure Evaluation (Signed)
Anesthesia Post Note  Patient: Monica Pearson  Procedure(s) Performed: RADIOLOGY WITH ANESTHESIA (N/A )     Patient location during evaluation: SICU Anesthesia Type: General Level of consciousness: sedated Pain management: pain level controlled Vital Signs Assessment: post-procedure vital signs reviewed and stable Respiratory status: patient remains intubated per anesthesia plan Cardiovascular status: stable Postop Assessment: no apparent nausea or vomiting Anesthetic complications: no    Last Vitals:  Vitals:   03/18/19 1200 03/18/19 1610  BP: (!) 108/57 115/62  Pulse: 80 79  Resp: 20 20  Temp: 36.8 C 37 C  SpO2: 99% 99%    Last Pain:  Vitals:   03/18/19 1610  TempSrc: Oral  PainSc:                  Johnta Couts

## 2019-03-18 NOTE — Progress Notes (Signed)
PROGRESS NOTE  Monica Pearson ZOX:[id number] DOB: 06/25/1946 DOA: 03/14/2019 PCP: Jenel Lucks, PA-C  Brief History:  73 y.o.female reformed smoker presented to the [employer] emergency department via [facility] with complaints of slurred speech, right-sided weakness and leftward gaze preference. Her symptoms reportedly resolved in route to the emergency department; however, in the emergency department, she developed recurring symptoms along with right-sided facial droop. Code STROKE was called. CT of the head was negative; however, CTA of the neck revealed left internal carotid artery occlusion. The patient is seen in 4 and 23 after undergoing mechanical thrombectomy. She has returned from the interventional radiology suite on mechanical ventilatory support. We are consulted for assistance with ventilator management.  4/21: Patient was extubated by PCCM on 4/20. She is doing well on room air, but failed swallow study. Plans are for speech to evaluate today. She continues to remain on pressors, but this is being weaned. PICC line today for better access. UTI with GNR for which she remains on Zosyn today. Will need to go to CIR on DC.  Assessment/Plan: 1. 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  ---Patient has prior history of subdural hematoma. 1. Echo on 4/20 with EF >65%, no WMA (see report)  2. LE [place] negative for DVT 3. CTA head and neck with nonocclusive thrombus within L ICA at its origin, length of approximately 12 mm (see report) 4. S/p revascularization by neuro interventional radiology, 4/19 5. Per neurology, holding antithrombotic at this time given recent SDH and elevated INR with thrombocytopenia and anemia  2. Klebsiella and enterococcus faecalis UTI.  S/p 4 days zosyn.  No clear UTI sx, but given hypotension, will continue abx for now. 1. Plan amox/clav through 03/22/19    3. AKI on CKD stage III- creatinine rising, to 1.98  today.  1.0-1.3 1. Given 40 mg IV lasix x1 given anasarca on 4/22 with stable serum creatinine 2. Follow I/O, daily weights 3. Restart po lasix 40 mg daily and spironolactone 12.5 mg daily--these are lower than PTA doses due to worsen renal function 4. Am BMP  4. Hypotension: weaned off levophed as tolerated.  Etiology of hypotension likely due to sepsis   5. Microcytic anemia with thrombocytopenia - downtrending. 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 1. Hb downtrending today, follow repeat Hb which is pending 2. Stool hemoccult pending 3. Patient is Jehovah's Witness  6. Nash cirrhosis with ascites.  Ascites search by [place] with mild to moderate ascites except in RLQ.  No fevers or abdominal tenderness.  Will hold off on diagnostic tap for now. 1. Diuresis as noted above  2. Continue lactulose with goal of 2-3 BM daily 3. Holding propranolol due to soft BPs  7. History of sarcoidosis-status postextubation 4/20. Stable and on RA now.  8. Type 2 diabetes with complications along with morbid obesity. Continue SSI. Blood glucose currently stable.  A.  With recent hypoglycemia--would not restart         Glipizide at time of d/c  B.  Would not restart metformin due to renal function  9. GERD. PPI.  Volume Overload: home spironolactone and lasix on hold initially  Given lasix 40 mg x1 on 4/22.  Restart po lasix and spironolactone as above.  Pt states that leg edema is actually better than normal      Disposition Plan:   [place] for CIR today if BP remains stable on po  lasix and sprionolactone Family Communication:   Family at bedside  Consultants:  Neurology primary  Code Status:  FULL  DVT Prophylaxis: SCDs   Procedures:  Cerebral angiogram 4/19 with mechanical thrombectomy by neuro IR  Antibiotics: None    Total time spent 35 minutes.  Greater than 50% spent face to face counseling and coordinating care.   Subjective: Patient  denies fevers, chills, headache, chest pain, dyspnea, nausea, vomiting, diarrhea, abdominal pain, dysuria, hematuria, hematochezia, and melena.   Objective: Vitals:   03/17/19 2013 03/18/19 0026 03/18/19 0500 03/18/19 0641  BP: 108/60 102/60 109/60   Pulse: 77 75 85   Resp: 16 18 20    Temp: 97.9 F (36.6 C) 97.7 F (36.5 C) 97.6 F (36.4 C)   TempSrc: Oral Oral Oral   SpO2: 95% 100% 99%   Weight:    113 kg  Height:        Intake/Output Summary (Last 24 hours) at 03/18/2019 0714 Last data filed at 03/18/2019 0600 Gross per 24 hour  Intake 1282.5 ml  Output 600 ml  Net 682.5 ml   Weight change:  Exam:   General:  Pt is alert, follows commands appropriately, not in acute distress  HEENT: No icterus, No thrush, No neck mass, [place]/AT  Cardiovascular: RRR, S1/S2, no rubs, no gallops  Respiratory: CTA bilaterally, no wheezing, no crackles, no rhonchi  Abdomen: Soft/+BS, non tender, non distended, no guarding  Extremities: 1 + LE edema, No lymphangitis, No petechiae, No rashes, no synovitis   Data Reviewed: I have personally reviewed following labs and imaging studies Basic Metabolic Panel: Recent Labs  Lab 03/14/19 1940 03/14/19 1941 03/15/19 0354 03/16/19 0426 03/17/19 0505 03/18/19 0400  NA 133*  --  134* 141 140 138  K 4.7  --  3.5 3.9 3.6 3.5  CL 98  --  103 108 104 106  CO2 26  --  22 18* 20* 22  GLUCOSE 172*  --  106* 89 167* 160*  BUN 81*  --  76* 70* 69* 63*  CREATININE 1.78* 1.70* 1.72* 1.76* 1.98* 1.95*  CALCIUM 8.3*  --  8.1* 8.5* 8.7* 8.1*   Liver Function Tests: Recent Labs  Lab 03/14/19 1940 03/16/19 0426 03/17/19 0505 03/18/19 0400  AST 104* 92* 85* 66*  ALT 102* 87* 89* 70*  ALKPHOS 225* 153* 173* 166*  BILITOT 2.6* 4.8* 3.2* 2.5*  PROT 5.8* 5.6* 5.9* 5.2*  ALBUMIN 2.1* 2.6* 2.6* 2.2*   No results for input(s): LIPASE, AMYLASE in the last 168 hours. Recent Labs  Lab 03/14/19 1941 03/16/19 0426 03/18/19 0400  AMMONIA 55* 40* 40*     Coagulation Profile: Recent Labs  Lab 03/14/19 1940 03/16/19 0426 03/17/19 0505 03/18/19 0400  INR 1.6* 1.9* 1.7* 1.9*   CBC: Recent Labs  Lab 03/14/19 1940 03/15/19 0354 03/16/19 0426 03/17/19 1117 03/17/19 1627  WBC 8.9 7.4 8.9 7.1  --   NEUTROABS 7.2 5.3  --   --   --   HGB 11.0* 9.5* 8.9* 7.2* 8.8*  HCT 30.0* Unable to determine due to a cold agglutinin 24.4* 19.8* 22.4*  MCV 69.6* Unable to determine due to a cold agglutinin 69.3* 71.7*  --   PLT 97* 83* 76* 72*  --    Cardiac Enzymes: No results for input(s): CKTOTAL, CKMB, CKMBINDEX, TROPONINI in the last 168 hours. BNP: Invalid input(s): POCBNP CBG: Recent Labs  Lab 03/17/19 0343 03/17/19 0826 03/17/19 1208 03/17/19 1544 03/17/19 2123  GLUCAP 228* 112* 146* 185*  135*   HbA1C: No results for input(s): HGBA1C in the last 72 hours. Urine analysis:    Component Value Date/Time   COLORURINE YELLOW 03/14/2019 2103   APPEARANCEUR HAZY (A) 03/14/2019 2103   LABSPEC 1.009 03/14/2019 2103   PHURINE 6.0 03/14/2019 2103   GLUCOSEU NEGATIVE 03/14/2019 2103   HGBUR SMALL (A) 03/14/2019 2103   BILIRUBINUR NEGATIVE 03/14/2019 2103   KETONESUR NEGATIVE 03/14/2019 2103   PROTEINUR NEGATIVE 03/14/2019 2103   UROBILINOGEN 0.2 02/23/2015 2000   NITRITE NEGATIVE 03/14/2019 2103   LEUKOCYTESUR MODERATE (A) 03/14/2019 2103   Sepsis Labs: @LABRCNTIP (procalcitonin:4,lacticidven:4) ) Recent Results (from the past 240 hour(s))  Culture, blood (Routine X 2) w Reflex to ID Panel     Status: None (Preliminary result)   Collection Time: 03/15/19  2:42 AM  Result Value Ref Range Status   Specimen Description BLOOD RIGHT HAND  Final   Special Requests   Final    BOTTLES DRAWN AEROBIC AND ANAEROBIC Blood Culture adequate volume   Culture   Final    NO GROWTH 2 DAYS Performed at [facility], [place] [address] [place], [place] [address]    Report Status PENDING  Incomplete  Culture, blood (Routine X 2) w Reflex to  ID Panel     Status: None (Preliminary result)   Collection Time: 03/15/19  2:42 AM  Result Value Ref Range Status   Specimen Description BLOOD RIGHT HAND  Final   Special Requests   Final    BOTTLES DRAWN AEROBIC AND ANAEROBIC Blood Culture adequate volume   Culture   Final    NO GROWTH 2 DAYS Performed at [facility], [place] [address] [place], [place] [address]    Report Status PENDING  Incomplete  Urine Culture     Status: Abnormal   Collection Time: 03/15/19  3:54 AM  Result Value Ref Range Status   Specimen Description URINE, CATHETERIZED  Final   Special Requests   Final    Normal Performed at [facility], [place] [address] [place], [place] [address]    Culture (A)  Final    >=100,000 COLONIES/mL KLEBSIELLA PNEUMONIAE 50,000 COLONIES/mL ENTEROCOCCUS FAECALIS    Report Status 03/17/2019 FINAL  Final   Organism ID, Bacteria KLEBSIELLA PNEUMONIAE (A)  Final   Organism ID, Bacteria ENTEROCOCCUS FAECALIS (A)  Final      Susceptibility   Enterococcus faecalis - MIC*    AMPICILLIN <=2 SENSITIVE Sensitive     LEVOFLOXACIN 2 SENSITIVE Sensitive     NITROFURANTOIN <=16 SENSITIVE Sensitive     VANCOMYCIN 1 SENSITIVE Sensitive     * 50,000 COLONIES/mL ENTEROCOCCUS FAECALIS   Klebsiella pneumoniae - MIC*    AMPICILLIN RESISTANT Resistant     CEFAZOLIN <=4 SENSITIVE Sensitive     CEFTRIAXONE <=1 SENSITIVE Sensitive     CIPROFLOXACIN <=0.25 SENSITIVE Sensitive     GENTAMICIN <=1 SENSITIVE Sensitive     IMIPENEM <=0.25 SENSITIVE Sensitive     NITROFURANTOIN 128 RESISTANT Resistant     TRIMETH/SULFA <=20 SENSITIVE Sensitive     AMPICILLIN/SULBACTAM 8 SENSITIVE Sensitive     PIP/TAZO <=4 SENSITIVE Sensitive     Extended ESBL NEGATIVE Sensitive     * >=100,000 COLONIES/mL KLEBSIELLA PNEUMONIAE  MRSA PCR Screening     Status: None   Collection Time: 03/15/19  3:54 AM  Result Value Ref Range Status   MRSA by PCR NEGATIVE NEGATIVE Final    Comment:        The  GeneXpert  MRSA Assay (FDA approved for NASAL specimens only), is one component of a comprehensive MRSA colonization surveillance program. It is not intended to diagnose MRSA infection nor to guide or monitor treatment for MRSA infections. Performed at [facility], [place] [address], [place], [place] [address]   Culture, respiratory     Status: None   Collection Time: 03/15/19 11:18 AM  Result Value Ref Range Status   Specimen Description TRACHEAL ASPIRATE  Final   Special Requests Normal  Final   Gram Stain   Final    ABUNDANT WBC PRESENT,BOTH PMN AND MONONUCLEAR NO ORGANISMS SEEN    Culture   Final    RARE Consistent with normal respiratory flora. Performed at [facility], [place] [address], [place], [place] [address]    Report Status 03/17/2019 FINAL  Final     Scheduled Meds:  amoxicillin-clavulanate  1 tablet Oral BID   chlorhexidine gluconate (MEDLINE KIT)  15 mL Mouth Rinse BID   Chlorhexidine Gluconate Cloth  6 each Topical Daily   insulin aspart  0-15 Units Subcutaneous TID WC & HS   insulin aspart  5 Units Subcutaneous TID WC   lactulose  10 g Oral BID   magnesium oxide  400 mg Oral QHS   mouth rinse  15 mL Mouth Rinse q12n4p   pantoprazole  40 mg Oral Q1200   sodium chloride flush  10-40 mL Intracatheter Q12H   Continuous Infusions:  sodium chloride 40 mL/hr at 03/17/19 2029   norepinephrine (LEVOPHED) Adult infusion Stopped (03/17/19 1116)    Procedures/Studies: Ct Angio Head W Or Wo Contrast  Result Date: 03/14/2019 CLINICAL DATA:  Right-sided deficits EXAM: CT ANGIOGRAPHY HEAD AND NECK TECHNIQUE: Multidetector CT imaging of the head and neck was performed using the standard protocol during bolus administration of intravenous contrast. Multiplanar CT image reconstructions and MIPs were obtained to evaluate the vascular anatomy. Carotid stenosis measurements (when applicable) are obtained utilizing NASCET criteria, using the distal internal carotid  diameter as the denominator. CONTRAST:  70m OMNIPAQUE IOHEXOL 350 MG/ML SOLN COMPARISON:  Head CT 03/14/2019 FINDINGS: CTA NECK FINDINGS SKELETON: There is no bony spinal canal stenosis. No lytic or blastic lesion. OTHER NECK: Normal pharynx, larynx and major salivary glands. No cervical lymphadenopathy. Unremarkable thyroid gland. UPPER CHEST: No pneumothorax or pleural effusion. No nodules or masses. AORTIC ARCH: There is no calcific atherosclerosis of the aortic arch. There is no aneurysm, dissection or hemodynamically significant stenosis of the visualized ascending aorta and aortic arch. Conventional 3 vessel aortic branching pattern. The visualized proximal subclavian arteries are widely patent. RIGHT CAROTID SYSTEM: --Common carotid artery: Widely patent origin without common carotid artery dissection or aneurysm. --Internal carotid artery: Normal without aneurysm, dissection or stenosis. --External carotid artery: No acute abnormality. LEFT CAROTID SYSTEM: --Common carotid artery: Widely patent origin without common carotid artery dissection or aneurysm. --Internal carotid artery: Qualitative narrowing of the proximal ICA, which otherwise remains patent to the skull base. There is low-attenuation thrombus within the proximal left ICA causing severe --External carotid artery: No acute abnormality. VERTEBRAL ARTERIES: Left dominant configuration. Both origins are normal. No dissection, occlusion or flow-limiting stenosis to the vertebrobasilar confluence. CTA HEAD FINDINGS POSTERIOR CIRCULATION: --Vertebral arteries: Normal codominant configuration of V4 segments. --Posterior inferior cerebellar arteries (PICA): Patent origins from the vertebral arteries. --Anterior inferior cerebellar arteries (AICA): The right AICA originates normally from the basilar artery. Left AICA not clearly visualized. --Basilar artery: Normal. --Superior cerebellar arteries: Normal. --Posterior cerebral arteries (PCA):  Normal. Both  originate from the basilar artery. Posterior communicating arteries (p-comm) are diminutive . ANTERIOR CIRCULATION: --Intracranial internal carotid arteries: Normal. --Anterior cerebral arteries (ACA): Normal. Both A1 segments are present. Patent anterior communicating artery (a-comm). --Middle cerebral arteries (MCA): Normal. VENOUS SINUSES: As permitted by contrast timing, patent. ANATOMIC VARIANTS: None Review of the MIP images confirms the above findings. IMPRESSION: 1. 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 This causes severe qualitative narrowing of proximal ICA. This be a source for small downstream emboli. 2. No intracranial occlusion or stenosis. 3. These results were called by telephone at the time of interpretation on 03/14/2019 at 8:23 pm to Dr. Kerney Elbe , who verbally acknowledged these results. Electronically Signed   By: Ulyses Jarred M.D.   On: 03/14/2019 20:23   Dg Chest 2 View  Result Date: 03/02/2019 CLINICAL DATA:  Shortness of breath EXAM: CHEST - 2 VIEW COMPARISON:  01/13/2019 FINDINGS: Low lung volumes. Peribronchial thickening and bibasilar opacities, likely atelectasis. Heart is normal size. No effusions. No acute bony abnormality. IMPRESSION: Bronchitic changes.  Low volumes with bibasilar atelectasis. Electronically Signed   By: Rolm Baptise M.D.   On: 03/02/2019 10:57   Ct Angio Neck W Or Wo Contrast  Result Date: 03/14/2019 CLINICAL DATA:  Right-sided deficits EXAM: CT ANGIOGRAPHY HEAD AND NECK TECHNIQUE: Multidetector CT imaging of the head and neck was performed using the standard protocol during bolus administration of intravenous contrast. Multiplanar CT image reconstructions and MIPs were obtained to evaluate the vascular anatomy. Carotid stenosis measurements (when applicable) are obtained utilizing NASCET criteria, using the distal internal carotid diameter as the denominator. CONTRAST:  48m OMNIPAQUE  IOHEXOL 350 MG/ML SOLN COMPARISON:  Head CT 03/14/2019 FINDINGS: CTA NECK FINDINGS SKELETON: There is no bony spinal canal stenosis. No lytic or blastic lesion. OTHER NECK: Normal pharynx, larynx and major salivary glands. No cervical lymphadenopathy. Unremarkable thyroid gland. UPPER CHEST: No pneumothorax or pleural effusion. No nodules or masses. AORTIC ARCH: There is no calcific atherosclerosis of the aortic arch. There is no aneurysm, dissection or hemodynamically significant stenosis of the visualized ascending aorta and aortic arch. Conventional 3 vessel aortic branching pattern. The visualized proximal subclavian arteries are widely patent. RIGHT CAROTID SYSTEM: --Common carotid artery: Widely patent origin without common carotid artery dissection or aneurysm. --Internal carotid artery: Normal without aneurysm, dissection or stenosis. --External carotid artery: No acute abnormality. LEFT CAROTID SYSTEM: --Common carotid artery: Widely patent origin without common carotid artery dissection or aneurysm. --Internal carotid artery: Qualitative narrowing of the proximal ICA, which otherwise remains patent to the skull base. There is low-attenuation thrombus within the proximal left ICA causing severe --External carotid artery: No acute abnormality. VERTEBRAL ARTERIES: Left dominant configuration. Both origins are normal. No dissection, occlusion or flow-limiting stenosis to the vertebrobasilar confluence. CTA HEAD FINDINGS POSTERIOR CIRCULATION: --Vertebral arteries: Normal codominant configuration of V4 segments. --Posterior inferior cerebellar arteries (PICA): Patent origins from the vertebral arteries. --Anterior inferior cerebellar arteries (AICA): The right AICA originates normally from the basilar artery. Left AICA not clearly visualized. --Basilar artery: Normal. --Superior cerebellar arteries: Normal. --Posterior cerebral arteries (PCA): Normal. Both originate from the basilar artery. Posterior  communicating arteries (p-comm) are diminutive . ANTERIOR CIRCULATION: --Intracranial internal carotid arteries: Normal. --Anterior cerebral arteries (ACA): Normal. Both A1 segments are present. Patent anterior communicating artery (a-comm). --Middle cerebral arteries (MCA): Normal. VENOUS SINUSES: As permitted by contrast timing, patent. ANATOMIC VARIANTS: None Review of the MIP images confirms  the above findings. IMPRESSION: 1. 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 This causes severe qualitative narrowing of proximal ICA. This be a source for small downstream emboli. 2. No intracranial occlusion or stenosis. 3. These results were called by telephone at the time of interpretation on 03/14/2019 at 8:23 pm to Dr. Kerney Elbe , who verbally acknowledged these results. Electronically Signed   By: Ulyses Jarred M.D.   On: 03/14/2019 20:23   Mr Brain Wo Contrast  Result Date: 03/15/2019 CLINICAL DATA:  Slurred speech with RIGHT-sided weakness and RIGHT gaze. Status post clot retrieval from the LEFT ICA. EXAM: MRI HEAD WITHOUT CONTRAST TECHNIQUE: Multiplanar, multiecho pulse sequences of the brain and surrounding structures were obtained without intravenous contrast. COMPARISON:  CTA head neck 03/14/2019. Catheter angiogram 03/14/2019. FINDINGS: The patient was unable to remain motionless for the exam. Small or subtle lesions could be overlooked. Brain: Small foci of acute infarction are seen in the LEFT hemisphere, MCA territory affecting the LEFT frontal and LEFT posterior frontal cortex and regional white matter. The largest area is located in the precentral gyrus, roughly 1 cm in size. No visible associated hemorrhage. Additional areas of restricted diffusion in the RIGHT subdural space, most notably over the frontal lobe, represent the previously reported subdural hematoma. No significant mass effect from these collections, which measures only a  few mm in thickness, maximal 5 mm over the RIGHT inferior frontal lobe. Elsewhere, generalized atrophy. Mild to moderate subcortical and periventricular T2 and FLAIR hyperintensities, likely chronic microvascular ischemic change. Vascular: Normal flow voids. Skull and upper cervical spine: Motion degraded, grossly negative. Sinuses/Orbits: Negative. Other: None. IMPRESSION: Small foci of acute infarction, LEFT MCA territory affecting the frontal and posterior frontal cortex and white matter. See discussion above. No associated hemorrhage. Small volume subdural hematoma over the RIGHT convexity, non worrisome. Atrophy and small vessel disease. Flow voids demonstrate patency of the LEFT carotid system status post clot retrieval. Electronically Signed   By: Staci Righter M.D.   On: 03/15/2019 17:08   Dg Chest Port 1 View  Result Date: 03/17/2019 CLINICAL DATA:  Occluded PICC line. EXAM: PORTABLE CHEST 1 VIEW COMPARISON:  Chest x-ray dated March 15, 2019. FINDINGS: Interval placement of a right upper extremity PICC line with the tip at the cavoatrial junction. Interval removal of the endotracheal and enteric tubes. Stable cardiomediastinal silhouette. Normal pulmonary vascularity. Persistent low lung volumes with mild bibasilar atelectasis. No focal consolidation, pleural effusion, or pneumothorax. No acute osseous abnormality. IMPRESSION: 1. Persistent low lung volumes with mild bibasilar atelectasis. Electronically Signed   By: Titus Dubin M.D.   On: 03/17/2019 11:52   Dg Chest Port 1 View  Result Date: 03/15/2019 CLINICAL DATA:  Check endotracheal tube placement EXAM: PORTABLE CHEST 1 VIEW COMPARISON:  03/14/2019 FINDINGS: Cardiac shadow is within normal limits. Endotracheal tube and nasogastric catheter are noted in satisfactory position. The overall inspiratory effort is poor with right basilar atelectasis. No bony abnormality is noted. IMPRESSION: Right basilar atelectasis is seen. Tubes and lines as  described. Electronically Signed   By: Inez Catalina M.D.   On: 03/15/2019 08:06   Dg Chest Portable 1 View  Result Date: 03/14/2019 CLINICAL DATA:  Intubation, OG tube placement EXAM: PORTABLE CHEST 1 VIEW COMPARISON:  03/02/2019 FINDINGS: Endotracheal tube is just into the right mainstem bronchus. Recommend retracting approximately 2-3 cm. NG tube tip is in the distal esophagus. Low lung volumes with bibasilar atelectasis. No effusions  or acute bony abnormality. IMPRESSION: Right mainstem intubation. Recommend retracting 2-3 cm. NG tube tip in the distal esophagus. Low lung volumes with bibasilar atelectasis. These results were called by telephone at the time of interpretation on 03/14/2019 at 9:19 pm to Dr. Noemi Chapel , who verbally acknowledged these results. Electronically Signed   By: Rolm Baptise M.D.   On: 03/14/2019 21:22   Dg Abd Portable 1 View  Result Date: 03/14/2019 CLINICAL DATA:  OG tube EXAM: PORTABLE ABDOMEN - 1 VIEW COMPARISON:  Chest x-ray today FINDINGS: OG tube tip is in the distal esophagus, better seen on chest x-ray. Nonobstructive bowel gas pattern. Prior cholecystectomy. IMPRESSION: OG tube tip in the distal esophagus, better seen on chest x-ray. Recommend advancing. Electronically Signed   By: Rolm Baptise M.D.   On: 03/14/2019 21:22   [place] Ascites (abdomen Limited)  Result Date: 03/17/2019 CLINICAL DATA:  73 year old female with cirrhosis.  Query ascites. EXAM: LIMITED ABDOMEN ULTRASOUND FOR ASCITES TECHNIQUE: Limited ultrasound survey for ascites was performed in all four abdominal quadrants. COMPARISON:  Ultrasound 05/15/2018. FINDINGS: Free fluid is demonstrated in both upper quadrants and the left lower quadrant. There is free fluid in the midline. However, the right lower quadrant is spared. IMPRESSION: Ascites is present except in the right lower quadrant. Volume appears mild to moderate. Electronically Signed   By: Genevie Ann M.D.   On: 03/17/2019 11:35   Ct Head Code  Stroke Wo Contrast  Result Date: 03/14/2019 CLINICAL DATA:  Code stroke. Slurred speech and right-sided deficits EXAM: CT HEAD WITHOUT CONTRAST TECHNIQUE: Contiguous axial images were obtained from the base of the skull through the vertex without intravenous contrast. COMPARISON:  Head CT 02/11/2019 FINDINGS: Brain: Minimal residual right convexity subdural blood, unchanged compared to 02/11/2019. No acute hemorrhage. The size and configuration of the ventricles and extra-axial CSF spaces are normal. There is hypoattenuation of the periventricular white matter, most commonly indicating chronic ischemic microangiopathy. Vascular: No abnormal hyperdensity of the major intracranial arteries or dural venous sinuses. No intracranial atherosclerosis. Skull: The visualized skull base, calvarium and extracranial soft tissues are normal. Sinuses/Orbits: No fluid levels or advanced mucosal thickening of the visualized paranasal sinuses. No mastoid or middle ear effusion. The orbits are normal. ASPECTS [facility] Stroke Program Early CT Score) - Ganglionic level infarction (caudate, lentiform nuclei, internal capsule, insula, M1-M3 cortex): 7 - Supraganglionic infarction (M4-M6 cortex): 3 Total score (0-10 with 10 being normal): 10 IMPRESSION: 1. Unchanged appearance of minimal residual right convexity subdural blood without acute hemorrhage. 2. ASPECTS is 10. 3. These results were communicated to Dr. Kerney Elbe at 7:50 pm on 03/14/2019 by text page via the [facility] messaging system. Electronically Signed   By: Ulyses Jarred M.D.   On: 03/14/2019 19:51   Vas [place] Lower Extremity Venous (dvt)  Result Date: 03/15/2019  Lower Venous Study Indications: Stroke, and Edema.  Limitations: Body habitus, poor ultrasound/tissue interface and poor patient cooperation; constant movement. Performing Technologist: Maudry Mayhew MHA, RDMS, RVT, RDCS  Examination Guidelines: A complete evaluation includes B-mode imaging, spectral Doppler,  color Doppler, and power Doppler as needed of all accessible portions of each vessel. Bilateral testing is considered an integral part of a complete examination. Limited examinations for reoccurring indications may be performed as noted.  +---------+---------------+---------+-----------+----------+-------------------+  RIGHT     Compressibility Phasicity Spontaneity Properties Summary              +---------+---------------+---------+-----------+----------+-------------------+  CFV       Full                                                                  +---------+---------------+---------+-----------+----------+-------------------+  SFJ                                                        Not visualized       +---------+---------------+---------+-----------+----------+-------------------+  FV Prox   Full                                                                  +---------+---------------+---------+-----------+----------+-------------------+  FV Mid                                                     patent, limited                                                                  visualization        +---------+---------------+---------+-----------+----------+-------------------+  FV Distal                                                  Not visualized       +---------+---------------+---------+-----------+----------+-------------------+  POP       Full                                                                  +---------+---------------+---------+-----------+----------+-------------------+  PTV                                                        patent, limited                                                                  visualization        +---------+---------------+---------+-----------+----------+-------------------+  PERO                                                       Not visualized       +---------+---------------+---------+-----------+----------+-------------------+    +---------+---------------+---------+-----------+----------+--------------+  LEFT      Compressibility Phasicity Spontaneity Properties Summary         +---------+---------------+---------+-----------+----------+--------------+  CFV                                                        Not visualized  +---------+---------------+---------+-----------+----------+--------------+  SFJ                                                        Not visualized  +---------+---------------+---------+-----------+----------+--------------+  FV Prox   Full                                                             +---------+---------------+---------+-----------+----------+--------------+  FV Mid                                                     Not visualized  +---------+---------------+---------+-----------+----------+--------------+  FV Distal                                                  Not visualized  +---------+---------------+---------+-----------+----------+--------------+  PFV                                                        Not visualized  +---------+---------------+---------+-----------+----------+--------------+  POP       Full            Yes       Yes                                    +---------+---------------+---------+-----------+----------+--------------+  PTV                                                        Not visualized  +---------+---------------+---------+-----------+----------+--------------+  PERO                                                       Not visualized  +---------+---------------+---------+-----------+----------+--------------+     Summary: Right: There is no evidence of deep vein thrombosis in the lower extremity. However, portions of this examination were limited- see technologist comments above. No  cystic structure found in the popliteal fossa. Left: There is no evidence of deep vein thrombosis in the lower extremity. However, portions of this examination were limited-  see technologist comments above. No cystic structure found in the popliteal fossa.  *See table(s) above for measurements and observations. Electronically signed by Ruta Hinds MD on 03/15/2019 at 6:41:43 PM.    Final    [place] Ekg Site Rite  Result Date: 03/16/2019 If Site Rite image not attached, placement could not be confirmed due to current cardiac rhythm.   Orson Eva, DO  [facility] Pager [phone]  If 7PM-7AM, please contact night-coverage [email or web address] Password [email or web address] 03/18/2019, 7:14 AM   LOS: 4 days

## 2019-03-18 NOTE — Progress Notes (Signed)
PICC site assessment: Caps inadvertently removed from both lumen of PICC. Discussed with Anderson Malta, RN via phone- instructed her to add new caps until I could assess line. Noted blood on outside of dressing, per Anderson Malta, pt bled when caps were off.  Wasted 35m blood from purple lumen. Unable to pull back blood on red lumen prior to flushing line. Both caps changed, lines flushed. Teaching reviewed with HNira Conn RN and pt. Explained incident to pt's son SNicki Reaper(per her request). He denied any questions.  Bruising noted at site, consistent with recent placement.  Assess frequently for signs of infection.

## 2019-03-18 NOTE — Discharge Summary (Addendum)
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 s/p IR L ICA near occlusive thrombus - likely secondary to large vessel disease source  Active Problems:   GERD (gastroesophageal reflux disease)   Stenosis of left carotid artery   Endotracheally intubated   Sarcoidosis of lung ([place])   Bacteriuria with pyuria   Bradycardia   CKD stage 3 due to type 2 diabetes mellitus ([place])   Type 2 diabetes mellitus with complication, with long-term current use of insulin (HCC)   Microcytic anemia   Thrombocytopenia (HCC)   Compromised airway   AKI (acute kidney injury) ([place])   Hepatic cirrhosis (HCC)   SDH (subdural hematoma) ([place])   Dyslipidemia   Tobacco abuse   Acute blood loss anemia   Acute respiratory failure ([place])   Acute renal failure superimposed on stage 3 chronic kidney disease ([place])  Past Medical History:  Diagnosis Date  . Cirrhosis ([place])   . Diabetes mellitus without complication ([place])   . Diabetic retinopathy ([place])   . Diabetic retinopathy ([place])   . GERD (gastroesophageal reflux disease)   . High cholesterol   . Hypertension   . Patient is Jehovah's Witness   . Sarcoidosis   . Stroke ([place])   . Subdural hematoma [facility])    Past Surgical History:  Procedure Laterality Date  . CHOLECYSTECTOMY    . KNEE SURGERY    . RADIOLOGY WITH ANESTHESIA N/A 03/14/2019   Procedure: RADIOLOGY WITH ANESTHESIA;  Surgeon: Luanne Bras, MD;  Location: [place];  Service: Radiology;  Laterality: N/A;    Medications to be continued on Rehab Allergies as of 03/18/2019       Reactions   Celecoxib Swelling   Ankles swell   Rofecoxib Swelling   Ankles swell   Ciprofloxacin Itching   Darvon [propoxyphene] Nausea And Vomiting   Hydrocodone-acetaminophen Nausea And Vomiting   Morphine Nausea And Vomiting      Medication List    STOP taking these medications   calcium carbonate 600 MG Tabs tablet Commonly known as:  OS-CAL   chlorthalidone 25 MG tablet Commonly known as:  HYGROTON   glipiZIDE 10 MG 24 hr tablet Commonly known as:  GLUCOTROL XL   HumaLOG KwikPen 100 UNIT/ML KwikPen Generic drug:  insulin lispro   Magnesium 500 MG Caps Replaced by:  magnesium oxide 400 (241.3 Mg) MG tablet   metFORMIN 500 MG tablet Commonly known as:  GLUCOPHAGE   propranolol 10 MG tablet Commonly known as:  INDERAL   Victoza 18 MG/3ML Sopn Generic drug:  liraglutide     TAKE these medications   amoxicillin-clavulanate 500-125 MG tablet Commonly known as:  AUGMENTIN Take 1 tablet (500 mg total) by mouth 2 (two) times daily.   co-enzyme Q-10 30 MG capsule Take 30 mg by mouth 2 (two) times daily.   furosemide 40 MG tablet Commonly known as:  LASIX Take 1 tablet (40 mg total) by mouth daily. Start taking on:  March 19, 2019 What changed:  when to take this   insulin aspart 100 UNIT/ML injection Commonly known as:  novoLOG Inject 0-15 Units into the skin 4 (four) times daily -  with meals and at bedtime.   insulin aspart 100 UNIT/ML injection Commonly known as:  novoLOG Inject 5 Units into the skin 3 (three) times daily with meals.   lactulose 10 GM/15ML solution Commonly known as:  CHRONULAC Take 15 mLs (10 g total) by mouth 2 (two) times daily. What changed:    when to take this  reasons to take this   magnesium oxide 400 (241.3 Mg) MG tablet Commonly known as:  MAG-OX Take 1 tablet (400 mg total) by mouth at bedtime. Replaces:  Magnesium 500 MG Caps   pantoprazole 40 MG tablet Commonly known as:  PROTONIX Take 1 tablet (40 mg total) by  mouth daily at 12 noon. Start taking on:  March 19, 2019   senna-docusate 8.6-50 MG tablet Commonly known as:  Senokot-S Take 1 tablet by mouth at bedtime as needed for mild constipation.   sodium chloride 0.9 % infusion Inject 40 mLs into the vein as needed (for administration of IV medications (carrier fluid)).   spironolactone 25 MG tablet Commonly known as:  ALDACTONE Take 0.5 tablets (12.5 mg total) by mouth daily. Start taking on:  March 19, 2019 What changed:    medication strength  how much to take  Another medication with the same name was removed. Continue taking this medication, and follow the directions you see here.       LABORATORY STUDIES CBC    Component Value Date/Time   WBC 7.6 03/18/2019 0400   RBC RESULTS UNAVAILABLE DUE TO INTERFERING SUBSTANCE 03/18/2019 0400   HGB 7.7 (L) 03/18/2019 0400   HCT RESULTS UNAVAILABLE DUE TO INTERFERING SUBSTANCE 03/18/2019 0400   PLT 70 (L) 03/18/2019 0400   MCV RESULTS UNAVAILABLE DUE TO INTERFERING SUBSTANCE 03/18/2019 0400   MCH RESULTS UNAVAILABLE DUE TO INTERFERING SUBSTANCE 03/18/2019 0400   MCHC RESULTS UNAVAILABLE DUE TO INTERFERING SUBSTANCE 03/18/2019 0400   RDW RESULTS UNAVAILABLE DUE TO INTERFERING SUBSTANCE 03/18/2019 0400   LYMPHSABS 1.2 03/15/2019 0354   MONOABS 0.8 03/15/2019 0354   EOSABS 0.1 03/15/2019 0354   BASOSABS 0.0 03/15/2019 0354   CMP    Component Value Date/Time   NA 138 03/18/2019 0400   K 3.5 03/18/2019 0400   CL 106 03/18/2019 0400   CO2 22 03/18/2019 0400   GLUCOSE 160 (H) 03/18/2019 0400   BUN 63 (H) 03/18/2019 0400   CREATININE 1.95 (H) 03/18/2019 0400   CALCIUM 8.1 (L) 03/18/2019 0400   PROT 5.2 (L) 03/18/2019 0400   ALBUMIN 2.2 (L) 03/18/2019 0400   AST 66 (H) 03/18/2019 0400   ALT 70 (H) 03/18/2019 0400   ALKPHOS 166 (H) 03/18/2019 0400   BILITOT 2.5 (H) 03/18/2019 0400   GFRNONAA 25 (L) 03/18/2019 0400   GFRAA 29 (L) 03/18/2019 0400   COAGS Lab Results  Component  Value Date   INR 1.9 (H) 03/18/2019   INR 1.7 (H) 03/17/2019   INR 1.9 (H) 03/16/2019   Lipid Panel    Component Value Date/Time   CHOL 165 03/15/2019 0354   TRIG 51 03/15/2019 0354   TRIG 51 03/15/2019 0354   HDL 40 (L) 03/15/2019 0354   CHOLHDL 4.1 03/15/2019 0354   VLDL 10 03/15/2019 0354   LDLCALC 115 (H) 03/15/2019 0354   HgbA1C  Lab Results  Component Value Date   HGBA1C 6.2 (H) 03/15/2019   Urinalysis    Component Value Date/Time   COLORURINE YELLOW 03/14/2019 2103  APPEARANCEUR HAZY (A) 03/14/2019 2103   LABSPEC 1.009 03/14/2019 2103   PHURINE 6.0 03/14/2019 2103   GLUCOSEU NEGATIVE 03/14/2019 2103   HGBUR SMALL (A) 03/14/2019 2103   BILIRUBINUR NEGATIVE 03/14/2019 2103   KETONESUR NEGATIVE 03/14/2019 2103   PROTEINUR NEGATIVE 03/14/2019 2103   UROBILINOGEN 0.2 02/23/2015 2000   NITRITE NEGATIVE 03/14/2019 2103   LEUKOCYTESUR MODERATE (A) 03/14/2019 2103   Urine Drug Screen     Component Value Date/Time   LABOPIA NONE DETECTED 03/14/2019 2103   COCAINSCRNUR NONE DETECTED 03/14/2019 2103   LABBENZ NONE DETECTED 03/14/2019 2103   AMPHETMU NONE DETECTED 03/14/2019 2103   THCU NONE DETECTED 03/14/2019 2103   LABBARB NONE DETECTED 03/14/2019 2103    Alcohol Level    Component Value Date/Time   ETH <10 03/14/2019 1940    SIGNIFICANT DIAGNOSTIC STUDIES Ct Angio Head W Or Wo Contrast  Result Date: 03/14/2019 CLINICAL DATA:  Right-sided deficits EXAM: CT ANGIOGRAPHY HEAD AND NECK TECHNIQUE: Multidetector CT imaging of the head and neck was performed using the standard protocol during bolus administration of intravenous contrast. Multiplanar CT image reconstructions and MIPs were obtained to evaluate the vascular anatomy. Carotid stenosis measurements (when applicable) are obtained utilizing NASCET criteria, using the distal internal carotid diameter as the denominator. CONTRAST:  74m OMNIPAQUE IOHEXOL 350 MG/ML SOLN COMPARISON:  Head CT 03/14/2019 FINDINGS:  CTA NECK FINDINGS SKELETON: There is no bony spinal canal stenosis. No lytic or blastic lesion. OTHER NECK: Normal pharynx, larynx and major salivary glands. No cervical lymphadenopathy. Unremarkable thyroid gland. UPPER CHEST: No pneumothorax or pleural effusion. No nodules or masses. AORTIC ARCH: There is no calcific atherosclerosis of the aortic arch. There is no aneurysm, dissection or hemodynamically significant stenosis of the visualized ascending aorta and aortic arch. Conventional 3 vessel aortic branching pattern. The visualized proximal subclavian arteries are widely patent. RIGHT CAROTID SYSTEM: --Common carotid artery: Widely patent origin without common carotid artery dissection or aneurysm. --Internal carotid artery: Normal without aneurysm, dissection or stenosis. --External carotid artery: No acute abnormality. LEFT CAROTID SYSTEM: --Common carotid artery: Widely patent origin without common carotid artery dissection or aneurysm. --Internal carotid artery: Qualitative narrowing of the proximal ICA, which otherwise remains patent to the skull base. There is low-attenuation thrombus within the proximal left ICA causing severe --External carotid artery: No acute abnormality. VERTEBRAL ARTERIES: Left dominant configuration. Both origins are normal. No dissection, occlusion or flow-limiting stenosis to the vertebrobasilar confluence. CTA HEAD FINDINGS POSTERIOR CIRCULATION: --Vertebral arteries: Normal codominant configuration of V4 segments. --Posterior inferior cerebellar arteries (PICA): Patent origins from the vertebral arteries. --Anterior inferior cerebellar arteries (AICA): The right AICA originates normally from the basilar artery. Left AICA not clearly visualized. --Basilar artery: Normal. --Superior cerebellar arteries: Normal. --Posterior cerebral arteries (PCA): Normal. Both originate from the basilar artery. Posterior communicating arteries (p-comm) are diminutive . ANTERIOR CIRCULATION:  --Intracranial internal carotid arteries: Normal. --Anterior cerebral arteries (ACA): Normal. Both A1 segments are present. Patent anterior communicating artery (a-comm). --Middle cerebral arteries (MCA): Normal. VENOUS SINUSES: As permitted by contrast timing, patent. ANATOMIC VARIANTS: None Review of the MIP images confirms the above findings. IMPRESSION: 1. 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 This causes severe qualitative narrowing of proximal ICA. This be a source for small downstream emboli. 2. No intracranial occlusion or stenosis. 3. These results were called by telephone at the time of interpretation on 03/14/2019 at 8:23 pm to Dr. EKerney Elbe, who verbally acknowledged these  results. Electronically Signed   By: Ulyses Jarred M.D.   On: 03/14/2019 20:23   Dg Chest 2 View  Result Date: 03/02/2019 CLINICAL DATA:  Shortness of breath EXAM: CHEST - 2 VIEW COMPARISON:  01/13/2019 FINDINGS: Low lung volumes. Peribronchial thickening and bibasilar opacities, likely atelectasis. Heart is normal size. No effusions. No acute bony abnormality. IMPRESSION: Bronchitic changes.  Low volumes with bibasilar atelectasis. Electronically Signed   By: Rolm Baptise M.D.   On: 03/02/2019 10:57   Ct Angio Neck W Or Wo Contrast  Result Date: 03/14/2019 CLINICAL DATA:  Right-sided deficits EXAM: CT ANGIOGRAPHY HEAD AND NECK TECHNIQUE: Multidetector CT imaging of the head and neck was performed using the standard protocol during bolus administration of intravenous contrast. Multiplanar CT image reconstructions and MIPs were obtained to evaluate the vascular anatomy. Carotid stenosis measurements (when applicable) are obtained utilizing NASCET criteria, using the distal internal carotid diameter as the denominator. CONTRAST:  25m OMNIPAQUE IOHEXOL 350 MG/ML SOLN COMPARISON:  Head CT 03/14/2019 FINDINGS: CTA NECK FINDINGS SKELETON: There is no bony spinal  canal stenosis. No lytic or blastic lesion. OTHER NECK: Normal pharynx, larynx and major salivary glands. No cervical lymphadenopathy. Unremarkable thyroid gland. UPPER CHEST: No pneumothorax or pleural effusion. No nodules or masses. AORTIC ARCH: There is no calcific atherosclerosis of the aortic arch. There is no aneurysm, dissection or hemodynamically significant stenosis of the visualized ascending aorta and aortic arch. Conventional 3 vessel aortic branching pattern. The visualized proximal subclavian arteries are widely patent. RIGHT CAROTID SYSTEM: --Common carotid artery: Widely patent origin without common carotid artery dissection or aneurysm. --Internal carotid artery: Normal without aneurysm, dissection or stenosis. --External carotid artery: No acute abnormality. LEFT CAROTID SYSTEM: --Common carotid artery: Widely patent origin without common carotid artery dissection or aneurysm. --Internal carotid artery: Qualitative narrowing of the proximal ICA, which otherwise remains patent to the skull base. There is low-attenuation thrombus within the proximal left ICA causing severe --External carotid artery: No acute abnormality. VERTEBRAL ARTERIES: Left dominant configuration. Both origins are normal. No dissection, occlusion or flow-limiting stenosis to the vertebrobasilar confluence. CTA HEAD FINDINGS POSTERIOR CIRCULATION: --Vertebral arteries: Normal codominant configuration of V4 segments. --Posterior inferior cerebellar arteries (PICA): Patent origins from the vertebral arteries. --Anterior inferior cerebellar arteries (AICA): The right AICA originates normally from the basilar artery. Left AICA not clearly visualized. --Basilar artery: Normal. --Superior cerebellar arteries: Normal. --Posterior cerebral arteries (PCA): Normal. Both originate from the basilar artery. Posterior communicating arteries (p-comm) are diminutive . ANTERIOR CIRCULATION: --Intracranial internal carotid arteries: Normal.  --Anterior cerebral arteries (ACA): Normal. Both A1 segments are present. Patent anterior communicating artery (a-comm). --Middle cerebral arteries (MCA): Normal. VENOUS SINUSES: As permitted by contrast timing, patent. ANATOMIC VARIANTS: None Review of the MIP images confirms the above findings. IMPRESSION: 1. 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 This causes severe qualitative narrowing of proximal ICA. This be a source for small downstream emboli. 2. No intracranial occlusion or stenosis. 3. These results were called by telephone at the time of interpretation on 03/14/2019 at 8:23 pm to Dr. EKerney Elbe, who verbally acknowledged these results. Electronically Signed   By: KUlyses JarredM.D.   On: 03/14/2019 20:23   Mr Brain Wo Contrast  Result Date: 03/15/2019 CLINICAL DATA:  Slurred speech with RIGHT-sided weakness and RIGHT gaze. Status post clot retrieval from the LEFT ICA. EXAM: MRI HEAD WITHOUT CONTRAST TECHNIQUE: Multiplanar, multiecho pulse sequences of the brain and surrounding  structures were obtained without intravenous contrast. COMPARISON:  CTA head neck 03/14/2019. Catheter angiogram 03/14/2019. FINDINGS: The patient was unable to remain motionless for the exam. Small or subtle lesions could be overlooked. Brain: Small foci of acute infarction are seen in the LEFT hemisphere, MCA territory affecting the LEFT frontal and LEFT posterior frontal cortex and regional white matter. The largest area is located in the precentral gyrus, roughly 1 cm in size. No visible associated hemorrhage. Additional areas of restricted diffusion in the RIGHT subdural space, most notably over the frontal lobe, represent the previously reported subdural hematoma. No significant mass effect from these collections, which measures only a few mm in thickness, maximal 5 mm over the RIGHT inferior frontal lobe. Elsewhere, generalized atrophy. Mild to moderate  subcortical and periventricular T2 and FLAIR hyperintensities, likely chronic microvascular ischemic change. Vascular: Normal flow voids. Skull and upper cervical spine: Motion degraded, grossly negative. Sinuses/Orbits: Negative. Other: None. IMPRESSION: Small foci of acute infarction, LEFT MCA territory affecting the frontal and posterior frontal cortex and white matter. See discussion above. No associated hemorrhage. Small volume subdural hematoma over the RIGHT convexity, non worrisome. Atrophy and small vessel disease. Flow voids demonstrate patency of the LEFT carotid system status post clot retrieval. Electronically Signed   By: Staci Righter M.D.   On: 03/15/2019 17:08   Dg Chest Port 1 View  Result Date: 03/17/2019 CLINICAL DATA:  Occluded PICC line. EXAM: PORTABLE CHEST 1 VIEW COMPARISON:  Chest x-ray dated March 15, 2019. FINDINGS: Interval placement of a right upper extremity PICC line with the tip at the cavoatrial junction. Interval removal of the endotracheal and enteric tubes. Stable cardiomediastinal silhouette. Normal pulmonary vascularity. Persistent low lung volumes with mild bibasilar atelectasis. No focal consolidation, pleural effusion, or pneumothorax. No acute osseous abnormality. IMPRESSION: 1. Persistent low lung volumes with mild bibasilar atelectasis. Electronically Signed   By: Titus Dubin M.D.   On: 03/17/2019 11:52   Dg Chest Port 1 View  Result Date: 03/15/2019 CLINICAL DATA:  Check endotracheal tube placement EXAM: PORTABLE CHEST 1 VIEW COMPARISON:  03/14/2019 FINDINGS: Cardiac shadow is within normal limits. Endotracheal tube and nasogastric catheter are noted in satisfactory position. The overall inspiratory effort is poor with right basilar atelectasis. No bony abnormality is noted. IMPRESSION: Right basilar atelectasis is seen. Tubes and lines as described. Electronically Signed   By: Inez Catalina M.D.   On: 03/15/2019 08:06   Dg Chest Portable 1 View  Result  Date: 03/14/2019 CLINICAL DATA:  Intubation, OG tube placement EXAM: PORTABLE CHEST 1 VIEW COMPARISON:  03/02/2019 FINDINGS: Endotracheal tube is just into the right mainstem bronchus. Recommend retracting approximately 2-3 cm. NG tube tip is in the distal esophagus. Low lung volumes with bibasilar atelectasis. No effusions or acute bony abnormality. IMPRESSION: Right mainstem intubation. Recommend retracting 2-3 cm. NG tube tip in the distal esophagus. Low lung volumes with bibasilar atelectasis. These results were called by telephone at the time of interpretation on 03/14/2019 at 9:19 pm to Dr. Noemi Chapel , who verbally acknowledged these results. Electronically Signed   By: Rolm Baptise M.D.   On: 03/14/2019 21:22   Dg Abd Portable 1 View  Result Date: 03/14/2019 CLINICAL DATA:  OG tube EXAM: PORTABLE ABDOMEN - 1 VIEW COMPARISON:  Chest x-ray today FINDINGS: OG tube tip is in the distal esophagus, better seen on chest x-ray. Nonobstructive bowel gas pattern. Prior cholecystectomy. IMPRESSION: OG tube tip in the distal esophagus, better seen on chest x-ray. Recommend advancing. Electronically  Signed   By: Rolm Baptise M.D.   On: 03/14/2019 21:22   [place] Ascites (abdomen Limited)  Result Date: 03/17/2019 CLINICAL DATA:  73 year old female with cirrhosis.  Query ascites. EXAM: LIMITED ABDOMEN ULTRASOUND FOR ASCITES TECHNIQUE: Limited ultrasound survey for ascites was performed in all four abdominal quadrants. COMPARISON:  Ultrasound 05/15/2018. FINDINGS: Free fluid is demonstrated in both upper quadrants and the left lower quadrant. There is free fluid in the midline. However, the right lower quadrant is spared. IMPRESSION: Ascites is present except in the right lower quadrant. Volume appears mild to moderate. Electronically Signed   By: Genevie Ann M.D.   On: 03/17/2019 11:35   Ct Head Code Stroke Wo Contrast  Result Date: 03/14/2019 CLINICAL DATA:  Code stroke. Slurred speech and right-sided deficits EXAM:  CT HEAD WITHOUT CONTRAST TECHNIQUE: Contiguous axial images were obtained from the base of the skull through the vertex without intravenous contrast. COMPARISON:  Head CT 02/11/2019 FINDINGS: Brain: Minimal residual right convexity subdural blood, unchanged compared to 02/11/2019. No acute hemorrhage. The size and configuration of the ventricles and extra-axial CSF spaces are normal. There is hypoattenuation of the periventricular white matter, most commonly indicating chronic ischemic microangiopathy. Vascular: No abnormal hyperdensity of the major intracranial arteries or dural venous sinuses. No intracranial atherosclerosis. Skull: The visualized skull base, calvarium and extracranial soft tissues are normal. Sinuses/Orbits: No fluid levels or advanced mucosal thickening of the visualized paranasal sinuses. No mastoid or middle ear effusion. The orbits are normal. ASPECTS [facility] Stroke Program Early CT Score) - Ganglionic level infarction (caudate, lentiform nuclei, internal capsule, insula, M1-M3 cortex): 7 - Supraganglionic infarction (M4-M6 cortex): 3 Total score (0-10 with 10 being normal): 10 IMPRESSION: 1. Unchanged appearance of minimal residual right convexity subdural blood without acute hemorrhage. 2. ASPECTS is 10. 3. These results were communicated to Dr. Kerney Elbe at 7:50 pm on 03/14/2019 by text page via the [facility] messaging system. Electronically Signed   By: Ulyses Jarred M.D.   On: 03/14/2019 19:51   Vas [place] Lower Extremity Venous (dvt)  Result Date: 03/15/2019  Lower Venous Study Indications: Stroke, and Edema.  Limitations: Body habitus, poor ultrasound/tissue interface and poor patient cooperation; constant movement. Performing Technologist: Maudry Mayhew MHA, RDMS, RVT, RDCS  Examination Guidelines: A complete evaluation includes B-mode imaging, spectral Doppler, color Doppler, and power Doppler as needed of all accessible portions of each vessel. Bilateral testing is considered an  integral part of a complete examination. Limited examinations for reoccurring indications may be performed as noted.  +---------+---------------+---------+-----------+----------+-------------------+ RIGHT    CompressibilityPhasicitySpontaneityPropertiesSummary             +---------+---------------+---------+-----------+----------+-------------------+ CFV      Full                                                             +---------+---------------+---------+-----------+----------+-------------------+ SFJ                                                   Not visualized      +---------+---------------+---------+-----------+----------+-------------------+ FV Prox  Full                                                             +---------+---------------+---------+-----------+----------+-------------------+  FV Mid                                                patent, limited                                                           visualization       +---------+---------------+---------+-----------+----------+-------------------+ FV Distal                                             Not visualized      +---------+---------------+---------+-----------+----------+-------------------+ POP      Full                                                             +---------+---------------+---------+-----------+----------+-------------------+ PTV                                                   patent, limited                                                           visualization       +---------+---------------+---------+-----------+----------+-------------------+ PERO                                                  Not visualized      +---------+---------------+---------+-----------+----------+-------------------+   +---------+---------------+---------+-----------+----------+--------------+ LEFT      CompressibilityPhasicitySpontaneityPropertiesSummary        +---------+---------------+---------+-----------+----------+--------------+ CFV                                                   Not visualized +---------+---------------+---------+-----------+----------+--------------+ SFJ                                                   Not visualized +---------+---------------+---------+-----------+----------+--------------+ FV Prox  Full                                                        +---------+---------------+---------+-----------+----------+--------------+  FV Mid                                                Not visualized +---------+---------------+---------+-----------+----------+--------------+ FV Distal                                             Not visualized +---------+---------------+---------+-----------+----------+--------------+ PFV                                                   Not visualized +---------+---------------+---------+-----------+----------+--------------+ POP      Full           Yes      Yes                                 +---------+---------------+---------+-----------+----------+--------------+ PTV                                                   Not visualized +---------+---------------+---------+-----------+----------+--------------+ PERO                                                  Not visualized +---------+---------------+---------+-----------+----------+--------------+     Summary: Right: There is no evidence of deep vein thrombosis in the lower extremity. However, portions of this examination were limited- see technologist comments above. No cystic structure found in the popliteal fossa. Left: There is no evidence of deep vein thrombosis in the lower extremity. However, portions of this examination were limited- see technologist comments above. No cystic structure found in the popliteal fossa.  *See  table(s) above for measurements and observations. Electronically signed by Ruta Hinds MD on 03/15/2019 at 6:41:43 PM.    Final    [place] Ekg Site Rite  Result Date: 03/16/2019 If Site Rite image not attached, placement could not be confirmed due to current cardiac rhythm.   Cerebral angio S/P lt common carotid arteriogram followed by complete revascularization of symptomatic severe stenosis of Lt ICA prox sec to a large nearly occlusive flow limiting thrombus with mechanical thrombectomy with x 1 pass with a 105m x 40 mm solitaire retreiver with prox flow arrest and penumbra aspiration . LT MCA TICI 3 revascularization .  2D Echocardiogram  1. The left ventricle has hyperdynamic systolic function, with an ejection fraction of >65%. The cavity size was normal. Left ventricular diastolic Doppler parameters are consistent with impaired relaxation. No evidence of left ventricular regional wall motion abnormalities. 2. Left atrial size was mildly dilated. 3. The ascending aorta and aortic root are normal in size and structure.      HISTORY OF PRESENT ILLNESS DYuli Laniganis an 73y.o. female with a history of liver cirrhosis and acute on chronic subdural hematoma January 20 of this year, who presents 03/14/2019 with stuttering TIA symptoms.  Family noted at home this evening that the patient was having sudden onset of slurred speech and right sided weakness with left sided gaze at 6:40 PM. On their arrival, EMS noted deficits as well, but they resolved en route. Vitals per EMS: BP 166/72, HR 76, O2 Sat 98%, CBG 205. On arrival to the ED, she experienced recurrent symptoms of dysarthria and right sided weakness with time of onset 7:17 PM on 03/14/2019. She has a history of cirrhosis in the context of a diagnosis of NASH, with severe ascites and peripheral edema, worse to her legs. The lower extremity edema is of such severity that it impairs her ability to move her legs, but she is still able to ambulate  at home. She states that she is independent with all of her ADLs. tPA was not given due to recent subdural hematoma.   HOSPITAL COURSE Ms. Celica Kotowski is a 73 y.o. female with history of NASH cirrhoisis, DB, GERD, HTN, sarcoid, [facility] Jan 2020 presenting with transient slurred speech, R sided weakness and L gaze that resolved enroute. In ED, devloped dysarthria and R sided weakness.   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 ASPECTS 10.     CTA head & neck L ICA nonocclusive thrombus at origin 98m long w/ severe narrowing. No IC occlusion/stenosis. Left PCA stenosis   Cerebral angio TICI3 revascularization severe stenosis L ICA w/ solitaire and penumbra  Post IR CT no ICH or mass effect MRI  Small L MCA infarct frontal and posterior frontal cortex and white matter. Small SDH over R convexity  2D Echo EF >65%. No source of embolus   LE venous doppler no DVT  LDL 115  HgbA1c 7.1  No antithrombotic prior to admission, no antithrombotic for now given recent SDH and elevated INR with thrombocytopenia and severe anemia  Therapy recommendations:  CIR   Disposition:  CIR  Acute Respiratory Failure, resolved Basilar Atelectasis Sarcoidosis Sepsis  Intubated for mechanical thrombectomy  Extubated 4/20  Trach aspirate - abundant WBC, no organisms  Empiric zosyn for aspiration & UTI coverage-> changed to Augmentin  Uncompensated NASH cirrhosis  With baseline ascites and leg edema  LE venous doppler neg DVT  Hyperammonemia @ 55->40->40  Elevated INR 1.9->1.6->1.9->1.7->1.9  Thrombocytopenia 97->83->76->72->70  Anemia Hb 11.0->9.5->8.9->7.7  completed albumin infusion  On lactulose   Anemia, microcytic  Hemoglobin 11.0-9.5-8.9-7.2-8.8-7.7  Iron 96, ferritin 236, TIBC 200  Could be acute blood loss due to  endovascular procedure  stool occult blood POSITIVE - no frank GIB and H&H stable  Close H&H monitoring   AKI on CKD stage III vs. Hepatorenal syndrome  Creatinine 1.34-1.78-1.72-1.76-1.98-1.95  Encourage p.o. intake  Fluid overload - on lasix - resume home lasix and spironolactone   Hx of stroke / SDH  12/2018 - acute on chronic SDH, EEG neg for seizure - improving and now only minimal residue   11/2016 admitted for right side weakness, MRI left pontine infarct, MRA neg, CUS/TTE neg. LDL 104 and A1C 8.3. Her ASA changed to plavix and added zetia  [facility]  Home meds:  Lasix 20, propranolol 10 bid, spironolactone 50  Off levophed  SBP goal 100-140  As per daughter-in-law, baseline BP at home 100s  Fluid overload - resume lasix and spironolactone  BP stable so  far  Hyperlipidemia  Home meds:  zetia 10  LDL 115, goal < 70  No statin due to elevated LFT and cirrhosis  Continue zetia at discharge  Diabetes type II Uncontrolled w/ retinopathy, nephropathy, vasculopathy  Home meds:  Glipizide 10, lispro 15u tid, liraglutide 1.8 q hs, metformin 500 bid  HgbA1c 7.1, goal < 7.0  CBGs  DM coordinator involved  On Novolog 5 unit 3 times daily WC  Glucose stable  UTI  Blood Cx reincubated for better growth  U Cx >100k klebsiella pneumoniae, 50K enterococcus faecalis  Empiric zosyn for aspiration & UTI coverage->changed to Augmentin  Dysphagia, resolved  Secondary to stroke  On Dys 3 thin lqiuids  SLP following  Other Stroke Risk Factors  Advanced age  Former Cigarette smoker  Morbid Obesity, Body mass index is 44.13 kg/m., recommend weight loss, diet and exercise as appropriate   Family hx stroke (mother)  PVD  OSA with CPAP at home  Other Active Problems  GERD on PPI  JEHOVAH'S WITNESS   DISCHARGE EXAM Blood pressure (!) 108/57, pulse 80, temperature 98.2 F (36.8 C),  temperature source Oral, resp. rate 20, height 5' 3"  (1.6 m), weight 113 kg, SpO2 99 %. General - Well nourished, well developed, not in acute distress, mildly sleepy.  Ophthalmologic - fundi not visualized due to noncooperation.  Cardiovascular - Regular rate and rhythm.  Neuro - sleepy but easily arousable, fully orientated, follow simple commands. No aphasia. Naming 3/3 and able to repeat. Visual field full, PERRL, EOMI, slight right nasolabial fold flattening, tongue midline. BUE 4/5 proximal and 4+/5 distally, BLE 3/5 proximal and 5/5 distal. Sensation symmetrical, FTN bilaterally intact but slow in action. DTR 1+ and no babinski. Gait not tested.    Discharge Diet  Carb modified thin liquids  DISCHARGE PLAN  Disposition:  Transfer to [place] for ongoing PT, OT and ST  No antithrombotics  Recommend ongoing stroke risk factor control by Primary Care Physician at time of discharge from inpatient rehabilitation.  Follow-up Jenel Lucks, PA-C in 2 weeks following discharge from rehab.  Follow-up in [facility] with Dr. Leonie Man in 4 weeks following discharge from rehab, office to schedule an appointment.    Follow-up Dr. Estanislado Pandy in 4 weeks after d/c from rehab  40 minutes were spent preparing discharge.  Rosalin Hawking, MD PhD Stroke Neurology 03/18/2019 3:14 PM

## 2019-03-18 NOTE — Progress Notes (Signed)
PT Cancellation Note  Patient Details Name: Monica Pearson MRN: [id number] DOB: August 22, 1946   Cancelled Treatment:    Reason Eval/Treat Not Completed: Other (comment)(RN present to assess issue with PICC line.  ) PT will continue to follow acutely.   Earney Navy, PTA Acute Rehabilitation Services Pager: [phone] Office: [phone]   03/18/2019, 12:21 PM

## 2019-03-19 ENCOUNTER — Encounter (HOSPITAL_COMMUNITY): Payer: Self-pay | Admitting: Interventional Radiology

## 2019-03-19 ENCOUNTER — Inpatient Hospital Stay (HOSPITAL_COMMUNITY): Payer: Medicare Other | Admitting: Speech Pathology

## 2019-03-19 ENCOUNTER — Inpatient Hospital Stay (HOSPITAL_COMMUNITY): Payer: Medicare Other | Admitting: Occupational Therapy

## 2019-03-19 ENCOUNTER — Inpatient Hospital Stay (HOSPITAL_COMMUNITY): Payer: Medicare Other | Admitting: Physical Therapy

## 2019-03-19 DIAGNOSIS — N184 Chronic kidney disease, stage 4 (severe): Secondary | ICD-10-CM

## 2019-03-19 LAB — COMPREHENSIVE METABOLIC PANEL
ALT: 65 U/L — ABNORMAL HIGH (ref 0–44)
AST: 59 U/L — ABNORMAL HIGH (ref 15–41)
Albumin: 2.2 g/dL — ABNORMAL LOW (ref 3.5–5.0)
Alkaline Phosphatase: 186 U/L — ABNORMAL HIGH (ref 38–126)
Anion gap: 10 (ref 5–15)
BUN: 67 mg/dL — ABNORMAL HIGH (ref 8–23)
CO2: 22 mmol/L (ref 22–32)
Calcium: 8.3 mg/dL — ABNORMAL LOW (ref 8.9–10.3)
Chloride: 107 mmol/L (ref 98–111)
Creatinine, Ser: 1.97 mg/dL — ABNORMAL HIGH (ref 0.44–1.00)
GFR calc Af Amer: 29 mL/min — ABNORMAL LOW (ref >60)
GFR calc non Af Amer: 25 mL/min — ABNORMAL LOW (ref >60)
Glucose, Bld: 108 mg/dL — ABNORMAL HIGH (ref 70–99)
Potassium: 3.5 mmol/L (ref 3.5–5.1)
Sodium: 139 mmol/L (ref 135–145)
Total Bilirubin: 3.1 mg/dL — ABNORMAL HIGH (ref 0.3–1.2)
Total Protein: 5.2 g/dL — ABNORMAL LOW (ref 6.5–8.1)

## 2019-03-19 LAB — GLUCOSE, CAPILLARY
Glucose-Capillary: 103 mg/dL — ABNORMAL HIGH (ref 70–99)
Glucose-Capillary: 170 mg/dL — ABNORMAL HIGH (ref 70–99)
Glucose-Capillary: 203 mg/dL — ABNORMAL HIGH (ref 70–99)
Glucose-Capillary: 132 mg/dL — ABNORMAL HIGH (ref 70–99)

## 2019-03-19 LAB — CBC WITH DIFFERENTIAL/PLATELET
Abs Immature Granulocytes: 0.02 10*3/uL (ref 0.00–0.07)
Basophils Absolute: 0 10*3/uL (ref 0.0–0.1)
Basophils Relative: 0 %
Eosinophils Absolute: 0.2 10*3/uL (ref 0.0–0.5)
Eosinophils Relative: 3 %
Hemoglobin: 7.9 g/dL — ABNORMAL LOW (ref 12.0–15.0)
Immature Granulocytes: 0 %
Lymphocytes Relative: 22 %
Lymphs Abs: 1.5 10*3/uL (ref 0.7–4.0)
Monocytes Absolute: 1.1 10*3/uL — ABNORMAL HIGH (ref 0.1–1.0)
Monocytes Relative: 16 %
Neutro Abs: 4 10*3/uL (ref 1.7–7.7)
Neutrophils Relative %: 59 %
Platelets: 64 10*3/uL — ABNORMAL LOW (ref 150–400)
WBC: 6.7 10*3/uL (ref 4.0–10.5)
nRBC: 1.2 % — ABNORMAL HIGH (ref 0.0–0.2)

## 2019-03-19 LAB — PROTIME-INR
INR: 1.6 — ABNORMAL HIGH (ref 0.8–1.2)
Prothrombin Time: 18.9 seconds — ABNORMAL HIGH (ref 11.4–15.2)

## 2019-03-19 MED ORDER — SODIUM CHLORIDE 0.9% FLUSH
10.0000 mL | Freq: Two times a day (BID) | INTRAVENOUS | Status: DC
  Administered 2019-03-19 – 2019-03-23 (×8): 10 mL

## 2019-03-19 MED ORDER — SALINE SPRAY 0.65 % NA SOLN
1.0000 | NASAL | Status: DC | PRN
  Administered 2019-03-19 – 2019-03-21 (×4): 1 via NASAL
  Filled 2019-03-19: qty 44

## 2019-03-19 NOTE — Evaluation (Signed)
Physical Therapy Assessment and Plan  Patient Details  Name: Monica Pearson MRN: [id number] Date of Birth: [birth year]  PT Diagnosis: Abnormality of gait, Difficulty walking, Hemiparesis dominant and Muscle weakness Rehab Potential: Good ELOS: 12-14 days   Today's Date: 03/19/2019 PT Individual Time: 1607-3710 PT Individual Time Calculation (min): 68 min    Problem List:  Patient Active Problem List   Diagnosis Date Noted  . Acute renal failure superimposed on stage 3 chronic kidney disease ([place]) 03/18/2019  . Left middle cerebral artery stroke ([place]) 03/18/2019  . AKI (acute kidney injury) ([place])   . Hepatic cirrhosis ([place])   . SDH (subdural hematoma) ([place])   . Dyslipidemia   . Tobacco abuse   . Acute blood loss anemia   . Acute respiratory failure ([place])   . Endotracheally intubated 03/15/2019  . Sarcoidosis of lung ([place]) 03/15/2019  . Bacteriuria with pyuria 03/15/2019  . Bradycardia 03/15/2019  . CKD stage 3 due to type 2 diabetes mellitus ([place]) 03/15/2019  . Type 2 diabetes mellitus with complication, with long-term current use of insulin ([place]) 03/15/2019  . Microcytic anemia 03/15/2019  . Thrombocytopenia ([place]) 03/15/2019  . Compromised airway   . Stroke (cerebrum) ([place]) 03/14/2019  . Stenosis of left carotid artery 03/14/2019  . Ascites   . Hypomagnesemia   . Hypokalemia   . Liver cirrhosis secondary to NASH ([place])   . Hyperammonemia ([place]) 01/14/2019  . Subdural hematoma ([place]) 01/14/2019  . Type 2 diabetes mellitus with vascular disease ([place]) 01/14/2019  . Acute hepatic encephalopathy 01/14/2019  . Acute metabolic encephalopathy 62/69/4854  . Hyperlipidemia 07/22/2017  . Hypertension 07/22/2017  . Obstructive sleep apnea treated with continuous positive airway pressure (CPAP) 07/22/2017  . Acute CVA (cerebrovascular accident) ([place]) 12/15/2016  . TIA (transient ischemic attack) 12/14/2016  . GERD (gastroesophageal reflux disease) 12/14/2016  . Insulin dependent diabetes  mellitus ([place]) 12/14/2016  . No blood products 12/14/2016    Past Medical History:  Past Medical History:  Diagnosis Date  . Cirrhosis ([place])   . Diabetes mellitus without complication ([place])   . Diabetic retinopathy ([place])   . Diabetic retinopathy ([place])   . GERD (gastroesophageal reflux disease)   . High cholesterol   . Hypertension   . Patient is Jehovah's Witness   . Sarcoidosis   . Stroke ([place])   . Subdural hematoma [facility])    Past Surgical History:  Past Surgical History:  Procedure Laterality Date  . CHOLECYSTECTOMY    . IR CT HEAD LTD  03/14/2019  . IR PERCUTANEOUS ART THROMBECTOMY/INFUSION INTRACRANIAL INC DIAG ANGIO  03/14/2019  . KNEE SURGERY    . RADIOLOGY WITH ANESTHESIA N/A 03/14/2019   Procedure: RADIOLOGY WITH ANESTHESIA;  Surgeon: Luanne Bras, MD;  Location: [facility];  Service: Radiology;  Laterality: N/A;    Assessment & Plan Clinical Impression: Patient is a 73 y.o. right-handed/Jehovah witness female with history of liver cirrhosis,CKD stage II, 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 Per chart review patient and nursing, lives alone independent with a rolling walker prior to admission and still drives. One level home. She has a son in the area checks on her routinely. Presented 03/14/2019 with right-sided weakness and slurred speech. Cranial CT scan reviewed by neurology suspect left MCA infarction. Minimal right convexity subdural blood without acute hemorrhage. Patient did not receive TPA. Echocardiogram with ejection fraction of 65%, hyperdynamic systolic function. Lower extremity Dopplers negative for DVT. CT angiogram of head and neck nonocclusive thrombus within  the left internal carotid artery at its origin length approximately 12 mm. Patient underwent left common carotid arteriogram followed by complete revascularization of symptomatic severe stenosis left ICA per interventional radiology on  03/14/2019.noted acute respiratory failure following revascularization procedure required intubation until 03/15/2019. Patient was initially maintained on pressors for low blood pressurethat slowly improved and monitored with Lasix and Aldactone resumed. Follow-up neurology services no anti-thrombotic at this time given recent SDH and mildly elevated INR 1.9 with thrombocytopenia. Venous Dopplers lower extremities negative. Urine study grade 100,000 Klebsiella, 50,000 enterococcus completing a course ofAugmentin.Her diet has been advanced to mechanical soft. Therapy evaluations completed and patient was admitted for a comprehensive rehabilitation program. Patient transferred to CIR on 03/18/2019 .   Patient currently requires mod with mobility secondary to muscle weakness, decreased cardiorespiratoy endurance, impaired timing and sequencing and unbalanced muscle activation and decreased sitting balance, decreased standing balance, decreased postural control and decreased balance strategies.  Prior to hospitalization, patient was modified independent  with mobility and lived with Alone in a House home.  Home access is 8 in front vs 1-2 in back (usual entrance is the back)Stairs to enter.  Patient will benefit from skilled PT intervention to maximize safe functional mobility, minimize fall risk and decrease caregiver burden for planned discharge home with 24 hour supervision.  Anticipate patient will benefit from follow up [place] at discharge.  PT - End of Session Activity Tolerance: Tolerates 30+ min activity with multiple rests Endurance Deficit: Yes Endurance Deficit Description: impaired PT Assessment Rehab Potential (ACUTE/IP ONLY): Good PT Barriers to Discharge: Inaccessible home environment;Decreased caregiver support;Medical stability;Home environment access/layout PT Patient demonstrates impairments in the following area(s): Balance;Skin Integrity;Edema;Endurance;Motor PT Transfers Functional  Problem(s): Bed Mobility;Bed to Chair;Car;Furniture PT Locomotion Functional Problem(s): Ambulation;Wheelchair Mobility;Stairs PT Plan PT Intensity: Minimum of 1-2 x/day ,45 to 90 minutes PT Frequency: 5 out of 7 days PT Duration Estimated Length of Stay: 12-14 days PT Treatment/Interventions: Ambulation/gait training;Discharge planning;DME/adaptive equipment instruction;Cognitive remediation/compensation;Functional mobility training;Pain management;Psychosocial support;Splinting/orthotics;UE/LE Strength taining/ROM;Therapeutic Activities;Balance/vestibular training;Community reintegration;Disease management/prevention;Functional electrical stimulation;Neuromuscular re-education;Patient/family education;Skin care/wound management;Stair training;Therapeutic Exercise;UE/LE Coordination activities;Wheelchair propulsion/positioning PT Transfers Anticipated Outcome(s): supervision bed<>chair transfers PT Locomotion Anticipated Outcome(s): supervision ambulation with LRAD PT Recommendation Recommendations for Other Services: Therapeutic Recreation consult Therapeutic Recreation Interventions: Pet therapy;Kitchen group;Stress management Follow Up Recommendations: Home health PT;24 hour supervision/assistance Patient destination: Home Equipment Recommended: To be determined Equipment Details: pt has RW  Skilled Therapeutic Intervention Evaluation completed (see details above and below) with education on PT POC and goals and individual treatment initiated with focus on bed mobility, transfers, ambulation, stair navigation, and car transfer. Pt received supine in bed and agreeable to therapy session. Pt noted to have wheezing breaths throughout session with SpO2 monitored to be 100%. Pt performed supine to sit, HOB flat and no bedrails, with mod assist for trunk control and significantly increased time/effort to perform mobility task. Pt performed sit<>stand from EOB/w/c to RW with mod assist initially  progressed to min assist/CGA throughout session. Therapist provided wider width w/c and w/c cushion for improved pt fit and decreased friction along pt's hips due to weeping skin. Pt performed stand pivot transfers w/c<>EOB using RW with min assist for balance - cuing throughout for sequencing of LE stepping. Pt transported to/from gym in w/c for time management and energy conservation. Pt performed ambulatory car transfer using RW with min assist for balance and mod assist for lifting BLEs into/out of car with pt reporting her family would assist with this at home prior to hospitlaization.  Pt ambulated ~71f up/down ramp using RW with min assist for balance. Pt ascended/descended 4 steps using bilateral handrails and step-to gait pattern with min/mod assist for balance - pt reports she typically uses RW at home due to no HRs but requested to use handrails for this session. Pt transported back to room in w/c and reports she needs to have BM. Pt ambulated ~112fusing RW with min assist for balance to bathroom. Pt reported BSC was too high and hurting the backs of her legs due to her feet not reaching the floor; however, this BSC does not have a lower setting - removed BSC over toilet for increased pt comfort. Pt left sitting on toilet with nursing staff present for pt handoff. RN notified of weeping skin, wheezing breathing and SpO2, dried blood in pt's nose, and pt's mobility level.   PT Evaluation Precautions/Restrictions Precautions Precautions: Fall;Other (comment) Precaution Comments: weeping skin Restrictions Weight Bearing Restrictions: No Pain Pain Assessment Pain Score: 0-No pain Home Living/Prior Functioning Home Living Living Arrangements: Alone Available Help at Discharge: Family;Available PRN/intermittently(son and daughter-in-law) Type of Home: House Home Access: Stairs to enter [employer] Steps: 8 in front vs 1-2 in back (usual entrance is the back) Entrance  Stairs-Rails: Right(R handrail in front; no HRs in back) Home Layout: One level Bathroom Shower/Tub: Wa[employer]imedia programmerStandard  Lives With: Alone Prior Function Level of Independence: Requires assistive device for independence;Independent with transfers;Independent with gait  Able to Take Stairs?: Yes(using RW) Driving: No(reports returned to driving ~3 weeks ago; however, prior to that had termporarily been unallowed to drive due to brain bleed (approximately a few months)) 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 Leisure: Hobbies-yes (Comment) Comments: reports it took her ~1hr to get in the car due to difficulty lifting BLEs due to excess fluid; reports son-in-law lowered her mattress; has RW; hobbies of crocheting and reading and minestry with the [employer] Vision/Perception  Perception Perception: Within Functional Limits Praxis Praxis: Intact  Cognition Overall Cognitive Status: No family/caregiver present to determine baseline cognitive functioning Arousal/Alertness: Awake/alert Orientation Level: Oriented X4 Attention: Focused;Sustained Focused Attention: Appears intact Sustained Attention: Appears intact Awareness: Appears intact Safety/Judgment: Appears intact Sensation Sensation Light Touch: Appears Intact Additional Comments: reports she feels a "heaviness" in B LEs due to excess fluid Coordination Gross Motor Movements are Fluid and Coordinated: No Coordination and Movement Description: impaired due to strength deficits Motor  Motor Motor: Hemiplegia  Mobility Bed Mobility Bed Mobility: Supine to Sit Supine to Sit: Moderate Assistance - Patient 50-74% Transfers Transfers: Sit to Stand;Stand to Sit;Stand Pivot Transfers Sit to Stand: Moderate Assistance - Patient 50-74% Stand to Sit: Minimal Assistance - Patient > 75% Stand Pivot Transfers: Minimal Assistance -  Patient > 75% Stand Pivot Transfer Details: Tactile cues for sequencing;Verbal cues for safe use of DME/AE;Verbal cues for technique;Verbal cues for sequencing Transfer (Assistive device): Rolling walker Locomotion  Gait Ambulation: Yes Gait Assistance: Minimal Assistance - Patient > 75% Gait Distance (Feet): 15 Feet Assistive device: Rolling walker Gait Assistance Details: Verbal cues for sequencing;Verbal cues for technique;Verbal cues for safe use of DME/AE Gait Gait: Yes Gait Pattern: Impaired Gait Pattern: Decreased step length - right;Decreased step length - left;Decreased weight shift to left;Decreased weight shift to right Gait velocity: significantly decreased Stairs / Additional Locomotion Stairs: Yes Stairs Assistance: Moderate Assistance - Patient 50 - 74%;Minimal Assistance - Patient > 75% Stair Management Technique: Two rails Number  of Stairs: 4 Height of Stairs: 6 Ramp: Minimal Assistance - Patient >75%(ambulating with RW) Wheelchair Mobility Wheelchair Mobility: No  Trunk/Postural Assessment  Cervical Assessment Cervical Assessment: Within Functional Limits Thoracic Assessment Thoracic Assessment: Within Functional Limits Lumbar Assessment Lumbar Assessment: Within Functional Limits Postural Control Postural Control: Deficits on evaluation Righting Reactions: impaired Postural Limitations: decreased  Balance Balance Balance Assessed: Yes Static Sitting Balance Static Sitting - Balance Support: Feet supported Static Sitting - Level of Assistance: 5: Stand by assistance Dynamic Sitting Balance Dynamic Sitting - Balance Support: During functional activity;Feet supported Dynamic Sitting - Level of Assistance: 5: Stand by assistance;4: Min assist Static Standing Balance Static Standing - Balance Support: During functional activity;Bilateral upper extremity supported Static Standing - Level of Assistance: 5: Stand by assistance(with RW) Dynamic Standing  Balance Dynamic Standing - Balance Support: During functional activity;Bilateral upper extremity supported Dynamic Standing - Level of Assistance: 4: Min assist;3: Mod assist(with RW) Extremity Assessment  RLE Assessment RLE Assessment: Exceptions to [facility] RLE Strength Right Hip Flexion: 3-/5 Right Knee Flexion: 3+/5 Right Knee Extension: 4-/5 Right Ankle Dorsiflexion: 4/5 Right Ankle Plantar Flexion: 4/5 LLE Assessment LLE Assessment: Exceptions to [facility] LLE Strength Left Hip Flexion: 3+/5 Left Knee Flexion: 4-/5 Left Knee Extension: 4+/5 Left Ankle Dorsiflexion: 4+/5 Left Ankle Plantar Flexion: 4+/5    Refer to Care Plan for Long Term Goals  Recommendations for other services: Therapeutic Recreation  Pet therapy, Kitchen group, Stress management and Outing/community reintegration  Discharge Criteria: Patient will be discharged from PT if patient refuses treatment 3 consecutive times without medical reason, if treatment goals not met, if there is a change in medical status, if patient makes no progress towards goals or if patient is discharged from hospital.  The above assessment, treatment plan, treatment alternatives and goals were discussed and mutually agreed upon: by patient  Tawana Scale, PT, DPT 03/19/2019, 2:33 PM

## 2019-03-19 NOTE — Evaluation (Signed)
Occupational Therapy Assessment and Plan  Patient Details  Name: Monica Pearson MRN: [id number] Date of Birth: 09/08/1946  OT Diagnosis: hemiplegia affecting dominant side, muscle weakness (generalized) and swelling of limb Rehab Potential: Rehab Potential (ACUTE ONLY): Good ELOS: 12-14 days   Today's Date: 03/19/2019 OT Individual Time: 0626-9485 OT Individual Time Calculation (min): 65 min     Problem List:  Patient Active Problem List   Diagnosis Date Noted  . Acute renal failure superimposed on stage 3 chronic kidney disease ([place]) 03/18/2019  . Left middle cerebral artery stroke ([place]) 03/18/2019  . AKI (acute kidney injury) ([place])   . Hepatic cirrhosis ([place])   . SDH (subdural hematoma) ([place])   . Dyslipidemia   . Tobacco abuse   . Acute blood loss anemia   . Acute respiratory failure ([place])   . Endotracheally intubated 03/15/2019  . Sarcoidosis of lung ([place]) 03/15/2019  . Bacteriuria with pyuria 03/15/2019  . Bradycardia 03/15/2019  . CKD stage 3 due to type 2 diabetes mellitus ([place]) 03/15/2019  . Type 2 diabetes mellitus with complication, with long-term current use of insulin ([place]) 03/15/2019  . Microcytic anemia 03/15/2019  . Thrombocytopenia ([place]) 03/15/2019  . Compromised airway   . Stroke (cerebrum) ([place]) 03/14/2019  . Stenosis of left carotid artery 03/14/2019  . Ascites   . Hypomagnesemia   . Hypokalemia   . Liver cirrhosis secondary to NASH ([place])   . Hyperammonemia ([place]) 01/14/2019  . Subdural hematoma ([place]) 01/14/2019  . Type 2 diabetes mellitus with vascular disease ([place]) 01/14/2019  . Acute hepatic encephalopathy 01/14/2019  . Acute metabolic encephalopathy 46/27/0350  . Hyperlipidemia 07/22/2017  . Hypertension 07/22/2017  . Obstructive sleep apnea treated with continuous positive airway pressure (CPAP) 07/22/2017  . Acute CVA (cerebrovascular accident) ([place]) 12/15/2016  . TIA (transient ischemic attack) 12/14/2016  . GERD (gastroesophageal reflux disease)  12/14/2016  . Insulin dependent diabetes mellitus ([place]) 12/14/2016  . No blood products 12/14/2016    Past Medical History:  Past Medical History:  Diagnosis Date  . Cirrhosis ([place])   . Diabetes mellitus without complication ([place])   . Diabetic retinopathy ([place])   . Diabetic retinopathy ([place])   . GERD (gastroesophageal reflux disease)   . High cholesterol   . Hypertension   . Patient is Jehovah's Witness   . Sarcoidosis   . Stroke ([place])   . Subdural hematoma [facility])    Past Surgical History:  Past Surgical History:  Procedure Laterality Date  . CHOLECYSTECTOMY    . IR CT HEAD LTD  03/14/2019  . IR PERCUTANEOUS ART THROMBECTOMY/INFUSION INTRACRANIAL INC DIAG ANGIO  03/14/2019  . KNEE SURGERY    . 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 Per chart review patient and nursing, lives alone independent with a rolling walker prior to admission and still drives. One level home. She has a son in the area checks on her routinely. Presented 03/14/2019 with right-sided weakness and slurred speech. Cranial CT scan reviewed by neurology suspect left MCA infarction. Minimal right convexity subdural blood without acute hemorrhage. Patient did not receive TPA. Echocardiogram with ejection fraction of 65%, hyperdynamic systolic function. Lower extremity Dopplers negative for DVT. CT angiogram of  head and neck nonocclusive thrombus within the left internal carotid artery at its origin length approximately 12 mm. Patient underwent left common carotid arteriogram followed by complete revascularization of symptomatic severe stenosis  left ICA per interventional radiology on 03/14/2019.noted acute respiratory failure following revascularization procedure required intubation until 03/15/2019. Patient was initially maintained on pressors for low blood pressure that slowly improved and monitored with Lasix and Aldactone resumed. Follow-up neurology services no anti-thrombotic at this time given recent SDH and mildly elevated INR 1.9 with thrombocytopenia. Venous Dopplers lower extremities negative. Urine study grade 100,000 Klebsiella, 50,000 enterococcus completing a course of Augmentin .Her diet has been advanced to mechanical soft. Therapy evaluations completed and patient was admitted for a comprehensive rehabilitation program.   Patient transferred to CIR on 03/18/2019 .    Patient currently requires total with LB basic self-care skills but set up with UB self care secondary to muscle weakness and edema, decreased cardiorespiratoy endurance, decreased coordination and decreased standing balance and decreased balance strategies.  Prior to hospitalization, patient lived alone, drove, was mod I.  Patient will benefit from skilled intervention to increase independence with basic self-care skills prior to discharge home with care partner.  Anticipate patient will require intermittent supervision and follow up home health.  OT - End of Session Activity Tolerance: Tolerates 10 - 20 min activity with multiple rests Endurance Deficit: Yes OT Assessment Rehab Potential (ACUTE ONLY): Good OT Barriers to Discharge: Decreased caregiver support OT Barriers to Discharge Comments: son and daughter in law work during the day OT Patient demonstrates impairments in the following area(s): Balance;Endurance;Motor;Skin Integrity OT Basic ADL's Functional Problem(s): Grooming;Bathing;Dressing;Toileting;Eating OT Advanced ADL's Functional Problem(s): Simple Meal Preparation;Light Housekeeping OT Transfers Functional Problem(s): Toilet;Tub/Shower OT  Additional Impairment(s): Fuctional Use of Upper Extremity OT Plan OT Intensity: Minimum of 1-2 x/day, 45 to 90 minutes OT Frequency: 5 out of 7 days OT Duration/Estimated Length of Stay: 12-14 days OT Treatment/Interventions: Balance/vestibular training;Discharge planning;DME/adaptive equipment instruction;Functional mobility training;Patient/family education;Neuromuscular re-education;Psychosocial support;Skin care/wound managment;Self Care/advanced ADL retraining;Therapeutic Activities;Therapeutic Exercise;UE/LE Strength taining/ROM;UE/LE Coordination activities OT Self Feeding Anticipated Outcome(s): I OT Basic Self-Care Anticipated Outcome(s): mod I with dressing, S bathing OT Toileting Anticipated Outcome(s): mod I OT Bathroom Transfers Anticipated Outcome(s): mod I to toilet, S to shower OT Recommendation Patient destination: Home Follow Up Recommendations: Home health OT Equipment Recommended: To be determined   Skilled Therapeutic Intervention Pt seen for initial evaluation and ADL training.  Pt somewhat lethargic but was able to wake up to participate well in session.  Explained role of OT and discussed patient's goals.  Pt sat to EOB with mod A and needed cues to rise to push up from bed to rise to stand.  Pt able to do so with min A.  Once standing she could walk to [facility] over toilet. Pt able to void.  From [facility], she washed and dressed UB with set up .  Max A LB bathing and cleansing after toileting,  Donned thigh high TEDS, legs with significant edema and weeping.  Total A to don pants.  Due to body habitus, brief and hospital pants not fitting well.  Pt ambulated to bed and then layed down with mod A to A with lifting legs so she could rest before her next session.  Pt in bed with all needs met and alarm set.   OT Evaluation Precautions/Restrictions  Precautions Precautions: Fall  Pain Pain Assessment Pain Score: 0-No pain Home Living/Prior Functioning Home  Living Family/patient expects to be discharged to::  Private residence Living Arrangements: Alone Available Help at Discharge: Family, Available PRN/intermittently Type of Home: House Home Access: Stairs to enter [employer] of Steps: 1 in front vs 5 in back Entrance Stairs-Rails: Right Home Layout: One level Bathroom Shower/Tub: Multimedia programmer: Standard  Lives With: Alone Prior Function Level of Independence: Independent with basic ADLs, Independent with homemaking with ambulation, Independent with transfers, Independent with gait, Requires assistive device for independence Driving: Yes Vocation: Retired Comments: Uses RW for ambulation; drives. Crochets at home ADL  total A LB, set up UB, min A with RW to ambulate to toilet Vision Baseline Vision/History: Wears glasses Wears Glasses: Reading only(driving) Patient Visual Report: Blurring of vision(occasional blurring with fatigue) Vision Assessment?: No apparent visual deficits Additional Comments: Pt's vision not blurry at time of eval Perception  Perception: Within Functional Limits Praxis Praxis: Intact Cognition Overall Cognitive Status: Within Functional Limits for tasks assessed Arousal/Alertness: Awake/alert Orientation Level: Person;Place;Situation Person: Oriented Place: Oriented Situation: Oriented Year: 2020 Month: April Day of Week: Correct Immediate Memory Recall: Sock;Blue;Bed Memory Recall: Sock;Blue;Bed Memory Recall Sock: Without Cue Memory Recall Blue: Without Cue Memory Recall Bed: Without Cue Sensation Sensation Light Touch: Appears Intact Hot/Cold: Appears Intact Proprioception: Appears Intact Stereognosis: Appears Intact Coordination Gross Motor Movements are Fluid and Coordinated: No Fine Motor Movements are Fluid and Coordinated: No Coordination and Movement Description: decreased on R side, pt having difficulty using her cell phone Motor  Motor Motor:  Hemiplegia Mobility    min A with RW Trunk/Postural Assessment  Cervical Assessment Cervical Assessment: Within Functional Limits Thoracic Assessment Thoracic Assessment: Within Functional Limits Lumbar Assessment Lumbar Assessment: Within Functional Limits Postural Control Postural Control: Within Functional Limits  Balance Dynamic Sitting Balance Dynamic Sitting - Level of Assistance: 5: Stand by assistance Static Standing Balance Static Standing - Level of Assistance: 5: Stand by assistance(with RW) Dynamic Standing Balance Dynamic Standing - Level of Assistance: 3: Mod assist(with Rw) Extremity/Trunk Assessment RUE Assessment RUE Assessment: Exceptions to [facility] Passive Range of Motion (PROM) Comments: WFL Active Range of Motion (AROM) Comments: WFL General Strength Comments: 4-/5 LUE Assessment LUE Assessment: Within Functional Limits     Refer to Care Plan for Long Term Goals  Recommendations for other services: None    Discharge Criteria: Patient will be discharged from OT if patient refuses treatment 3 consecutive times without medical reason, if treatment goals not met, if there is a change in medical status, if patient makes no progress towards goals or if patient is discharged from hospital.  The above assessment, treatment plan, treatment alternatives and goals were discussed and mutually agreed upon: by patient  [facility] 03/19/2019, 12:45 PM

## 2019-03-19 NOTE — Discharge Instructions (Signed)
Inpatient Rehab Discharge Instructions  Kamirah Shugrue Discharge date and time: No discharge date for patient encounter.   Activities/Precautions/ Functional Status: Activity: activity as tolerated Diet: diabetic diet Wound Care: none needed Functional status:  ___ No restrictions     ___ Walk up steps independently ___ 24/7 supervision/assistance   ___ Walk up steps with assistance ___ Intermittent supervision/assistance  ___ Bathe/dress independently ___ Walk with walker     _x__ Bathe/dress with assistance ___ Walk Independently    ___ Shower independently ___ Walk with assistance    ___ Shower with assistance ___ No alcohol     ___ Return to work/school ________  Special Instructions:  No smoking drinking or alcohol. No driving STROKE/TIA DISCHARGE INSTRUCTIONS SMOKING Cigarette smoking nearly doubles your risk of having a stroke & is the single most alterable risk factor  If you smoke or have smoked in the last 12 months, you are advised to quit smoking for your health.  Most of the excess cardiovascular risk related to smoking disappears within a year of stopping.  Ask you doctor about anti-smoking medications   Quit Line: 1-800-QUIT NOW  Free Smoking Cessation Classes [phone]  CHOLESTEROL Know your levels; limit fat & cholesterol in your diet  Lipid Panel     Component Value Date/Time   CHOL 165 03/15/2019 0354   TRIG 51 03/15/2019 0354   TRIG 51 03/15/2019 0354   HDL 40 (L) 03/15/2019 0354   CHOLHDL 4.1 03/15/2019 0354   VLDL 10 03/15/2019 0354   LDLCALC 115 (H) 03/15/2019 0354      Many patients benefit from treatment even if their cholesterol is at goal.  Goal: Total Cholesterol (CHOL) less than 160  Goal:  Triglycerides (TRIG) less than 150  Goal:  HDL greater than 40  Goal:  LDL (LDLCALC) less than 100   BLOOD PRESSURE American Stroke Association blood pressure target is less that 120/80 mm/Hg  Your discharge blood pressure is:  BP: (!) 101/46   Monitor your blood pressure  Limit your salt and alcohol intake  Many individuals will require more than one medication for high blood pressure  DIABETES (A1c is a blood sugar average for last 3 months) Goal HGBA1c is under 7% (HBGA1c is blood sugar average for last 3 months)  Diabetes:   Lab Results  Component Value Date   HGBA1C 6.2 (H) 03/15/2019     Your HGBA1c can be lowered with medications, healthy diet, and exercise.  Check your blood sugar as directed by your physician  Call your physician if you experience unexplained or low blood sugars.  PHYSICAL ACTIVITY/REHABILITATION Goal is 30 minutes at least 4 days per week  Activity: Increase activity slowly, Therapies: Physical Therapy: Home Health Return to work:   Activity decreases your risk of heart attack and stroke and makes your heart stronger.  It helps control your weight and blood pressure; helps you relax and can improve your mood.  Participate in a regular exercise program.  Talk with your doctor about the best form of exercise for you (dancing, walking, swimming, cycling).  DIET/WEIGHT Goal is to maintain a healthy weight  Your discharge diet is:  Diet Order            Diet Carb Modified Fluid consistency: Thin; Room service appropriate? Yes  Diet effective now              liquids Your height is:    Your current weight is: Weight: 112.4 kg Your Body Mass Index (  BMI) is:  BMI (Calculated): 43.91  Following the type of diet specifically designed for you will help prevent another stroke.  Your goal weight range is:    Your goal Body Mass Index (BMI) is 19-24.  Healthy food habits can help reduce 3 risk factors for stroke:  High cholesterol, hypertension, and excess weight.  RESOURCES Stroke/Support Group:  Call [phone]   STROKE EDUCATION PROVIDED/REVIEWED AND GIVEN TO PATIENT Stroke warning signs and symptoms How to activate emergency medical system (call 911). Medications prescribed at  discharge. Need for follow-up after discharge. Personal risk factors for stroke. Pneumonia vaccine given:  Flu vaccine given:  My questions have been answered, the writing is legible, and I understand these instructions.  I will adhere to these goals & educational materials that have been provided to me after my discharge from the hospital.     My questions have been answered and I understand these instructions. I will adhere to these goals and the provided educational materials after my discharge from the hospital.  Patient/Caregiver Signature _______________________________ Date __________  Clinician Signature _______________________________________ Date __________  Please bring this form and your medication list with you to all your follow-up doctor's appointments.

## 2019-03-19 NOTE — Plan of Care (Signed)
  Problem: RH SAFETY Goal: RH STG ADHERE TO SAFETY PRECAUTIONS W/ASSISTANCE/DEVICE Description STG Adhere to Safety Precautions With  Mod Assistance/Device.  Outcome: Progressing   Problem: RH PAIN MANAGEMENT Goal: RH STG PAIN MANAGED AT OR BELOW PT'S PAIN GOAL Description Pain less than 2  Outcome: Progressing

## 2019-03-19 NOTE — Plan of Care (Signed)
  Problem: RH BOWEL ELIMINATION Goal: RH STG MANAGE BOWEL WITH ASSISTANCE Description STG Manage Bowel with Mod Assistance.  Outcome: Not Progressing  Pt is incontinent of bowel.   Problem: RH BLADDER ELIMINATION Goal: RH STG MANAGE BLADDER WITH ASSISTANCE Description STG Manage Bladder With modvAssistance  Outcome: Not Progressing  Pt is incontinent of bladder.

## 2019-03-19 NOTE — Progress Notes (Signed)
Social Work Social Work Assessment and Plan  Patient Details  Name: Dalana Pfahler MRN: [id number] Date of Birth: 07-May-1946  Today's Date: 03/19/2019  Problem List:  Patient Active Problem List   Diagnosis Date Noted  . Acute renal failure superimposed on stage 3 chronic kidney disease ([place]) 03/18/2019  . Left middle cerebral artery stroke ([place]) 03/18/2019  . AKI (acute kidney injury) ([place])   . Hepatic cirrhosis ([place])   . SDH (subdural hematoma) ([place])   . Dyslipidemia   . Tobacco abuse   . Acute blood loss anemia   . Acute respiratory failure ([place])   . Endotracheally intubated 03/15/2019  . Sarcoidosis of lung ([place]) 03/15/2019  . Bacteriuria with pyuria 03/15/2019  . Bradycardia 03/15/2019  . CKD stage 3 due to type 2 diabetes mellitus ([place]) 03/15/2019  . Type 2 diabetes mellitus with complication, with long-term current use of insulin ([place]) 03/15/2019  . Microcytic anemia 03/15/2019  . Thrombocytopenia ([place]) 03/15/2019  . Compromised airway   . Stroke (cerebrum) ([place]) 03/14/2019  . Stenosis of left carotid artery 03/14/2019  . Ascites   . Hypomagnesemia   . Hypokalemia   . Liver cirrhosis secondary to NASH ([place])   . Hyperammonemia ([place]) 01/14/2019  . Subdural hematoma ([place]) 01/14/2019  . Type 2 diabetes mellitus with vascular disease ([place]) 01/14/2019  . Acute hepatic encephalopathy 01/14/2019  . Acute metabolic encephalopathy 32/20/2542  . Hyperlipidemia 07/22/2017  . Hypertension 07/22/2017  . Obstructive sleep apnea treated with continuous positive airway pressure (CPAP) 07/22/2017  . Acute CVA (cerebrovascular accident) ([place]) 12/15/2016  . TIA (transient ischemic attack) 12/14/2016  . GERD (gastroesophageal reflux disease) 12/14/2016  . Insulin dependent diabetes mellitus ([place]) 12/14/2016  . No blood products 12/14/2016   Past Medical History:  Past Medical History:  Diagnosis Date  . Cirrhosis ([place])   . Diabetes mellitus without complication ([place])   . Diabetic  retinopathy ([place])   . Diabetic retinopathy ([place])   . GERD (gastroesophageal reflux disease)   . High cholesterol   . Hypertension   . Patient is Jehovah's Witness   . Sarcoidosis   . Stroke ([place])   . Subdural hematoma [facility])    Past Surgical History:  Past Surgical History:  Procedure Laterality Date  . CHOLECYSTECTOMY    . IR CT HEAD LTD  03/14/2019  . IR PERCUTANEOUS ART THROMBECTOMY/INFUSION INTRACRANIAL INC DIAG ANGIO  03/14/2019  . KNEE SURGERY    . RADIOLOGY WITH ANESTHESIA N/A 03/14/2019   Procedure: RADIOLOGY WITH ANESTHESIA;  Surgeon: Luanne Bras, MD;  Location: [place];  Service: Radiology;  Laterality: N/A;   Social History:  reports that she has quit smoking. She has never used smokeless tobacco. She reports that she does not drink alcohol or use drugs.  Family / Support Systems Marital Status: Separated Patient Roles: Parent(three son's-one local) Children: Shad-son [phone]-cell   Other Supports: Bernetta-daughter in-law [phone]   Diane-friend [phone] Anticipated Caregiver: Son and daughter in-law, friends to check on her Ability/Limitations of Caregiver: Supervision level-son works as well as daughter in-law from home Caregiver Availability: Intermittent Family Dynamics: Close with all of her boys-one is local others are out of state. Shehas friends who are supportive and will visit her and check on her once home. Pt has always been independent and taken care of herself  Social History Preferred language: English Religion: Jehovah's Witness Cultural Background: Jehovah Witness Education: High School Read: Yes Write: Yes Employment Status: Retired Public relations account executive Issues: No issues Guardian/Conservator: None-according to MD pt  is capable of making her own decisions while here. She does want son involved if any decisions need to be made while here   Abuse/Neglect Abuse/Neglect Assessment Can Be Completed: Yes Physical Abuse:  Denies Verbal Abuse: Denies Sexual Abuse: Denies Exploitation of patient/patient's resources: Denies Self-Neglect: Denies  Emotional Status Pt's affect, behavior and adjustment status: Pt is motivated to do well and has always somehow been independent she prides herself on this. Even after her SDH in 11/2018 she was able to provide for herself. Her main issue is her IBS and the fluid she has in her middle Recent Psychosocial Issues: other health issues has been in and out of the hospital since 11/2018 Psychiatric History: No history deferred depression screen due to adjusting to the rehab unit-first day. Going well and pt pushes herself whether she feels like it or not. Do feel she would benefit from seeing neuro-psych while here. Substance Abuse History: Quit tobacco doesn't drink  Patient / Family Perceptions, Expectations & Goals Pt/Family understanding of illness & functional limitations: Pt is able to explain her medical issues and reports fluid and IBS more of an issues than her CVA. She is recovering from her stroke deficits. She talks with the MD daily and feels she has a good understanding of her treatment plan going forward. Premorbid pt/family roles/activities: Mom, grandmother, retiree, friend, etc Anticipated changes in roles/activities/participation: resume Pt/family expectations/goals: Pt states: " I hope to be able to take care of myself before I leave here. That is my plan."  Son states: " We will do what we can but do work and are limited."  [employer]: Other (Comment) Premorbid Home Care/DME Agencies: Other (Comment)([facility] active pt with them PT & RN) Transportation available at discharge: Family pt stopped driving 12/5001 after her SDH Resource referrals recommended: Neuropsychology, Support group (specify)  Discharge Planning Living Arrangements: Alone Support Systems: Children, Water engineer, Church/faith community Type of Residence: Private  residence Insurance Resources: Multimedia programmer (specify)(UHC-Medicare) Financial Resources: Social Security Financial Screen Referred: No Living Expenses: Own Money Management: Patient Does the patient have any problems obtaining your medications?: No Home Management: Self and son along with daughter in-law Patient/Family Preliminary Plans: Return home with intermittent assist from son and daugher in-law along with her friends also. Pt will need to be mod/i level so she will be safe home alone. Will await therapy team evaluations and work on plans.  Sw Barriers to Discharge: Decreased caregiver support Sw Barriers to Discharge Comments: Does not have 24 hr care-family works Social Work Anticipated Follow Up Needs: HH/OP, Support Group  Clinical Impression Pleasant female who is motivated to do well and regain her independent level. Her main issue now is her fluid and IBS she is 60 lbs bigger now due to fluid issues/liver issues. She is moving well and recovering from her stroke deficits. Her family is supportive and will do what they can for her. Do feel she would benefit from seeing neuro-psych while here due to all she has been through the past six months. Will make referral for this  Elease Hashimoto 03/19/2019, 1:38 PM

## 2019-03-19 NOTE — Care Management Note (Signed)
[place] Individual Statement of Services  Patient Name:  Maribell Demeo  Date:  03/19/2019  Welcome to the [place].  Our goal is to provide you with an individualized program based on your diagnosis and situation, designed to meet your specific needs.  With this comprehensive rehabilitation program, you will be expected to participate in at least 3 hours of rehabilitation therapies Monday-Friday, with modified therapy programming on the weekends.  Your rehabilitation program will include the following services:  Physical Therapy (PT), Occupational Therapy (OT), 24 hour per day rehabilitation nursing, Neuropsychology, Case Management (Social Worker), Rehabilitation Medicine, Nutrition Services and Pharmacy Services  Weekly team conferences will be held on Wednesday to discuss your progress.  Your Social Worker will talk with you frequently to get your input and to update you on team discussions.  Team conferences with you and your family in attendance may also be held.  Expected length of stay: 12-14 days  Overall anticipated outcome:supervision level   Depending on your progress and recovery, your program may change. Your Social Worker will coordinate services and will keep you informed of any changes. Your Social Worker's name and contact numbers are listed  below.  The following services may also be recommended but are not provided by the [facility]:    [place] will be made to provide these services after discharge if needed.  Arrangements include referral to agencies that provide these services.  Your insurance has been verified to be:  [employer]-Medicare Your primary doctor is:  Agricultural engineer  Pertinent information will be shared with your doctor and your insurance company.  Social Worker:  Ovidio Kin, [place] or ([phone]  Information discussed with and copy given to patient by: Elease Hashimoto, 03/19/2019, 11:19 AM

## 2019-03-19 NOTE — IPOC Note (Addendum)
Overall Plan of Care [facility]) Patient Details Name: Monica Pearson MRN: [id number] DOB: 02-17-1946  Admitting Diagnosis: <principal problem not specified>  Hospital Problems: Active Problems:   Left middle cerebral artery stroke [facility])     Functional Problem List: Nursing Bowel, Edema, Endurance, Medication Management, Motor, Nutrition, Pain, Perception, Safety, Sensory, Skin Integrity  PT Balance, Skin Integrity, Edema, Endurance, Motor  OT Balance, Endurance, Motor, Skin Integrity  SLP Cognition  TR         Basic ADL's: OT Grooming, Bathing, Dressing, Toileting, Eating     Advanced  ADL's: OT Simple Meal Preparation, Light Housekeeping     Transfers: PT Bed Mobility, Bed to Chair, Car, Manufacturing systems engineer, Metallurgist: PT Ambulation, Emergency planning/management officer, Stairs     Additional Impairments: OT Fuctional Use of Upper Extremity  SLP        TR      Anticipated Outcomes Item Anticipated Outcome  Self Feeding I  Swallowing      Basic self-care  mod I with dressing, S bathing  Toileting  mod I   Bathroom Transfers mod I to toilet, S to shower  Bowel/Bladder  mod I  Transfers  supervision bed<>chair transfers  Locomotion  supervision ambulation with LRAD  Communication     Cognition  Mod I   Pain  less than 2  Safety/Judgment  mod I   Therapy Plan: PT Intensity: Minimum of 1-2 x/day ,45 to 90 minutes PT Frequency: 5 out of 7 days PT Duration Estimated Length of Stay: 12-14 days OT Intensity: Minimum of 1-2 x/day, 45 to 90 minutes OT Frequency: 5 out of 7 days OT Duration/Estimated Length of Stay: 12-14 days SLP Intensity: Minumum of 1-2 x/day, 30 to 90 minutes SLP Frequency: 3 to 5 out of 7 days SLP Duration/Estimated Length of Stay: 12-14 days    Due to the current state of emergency, patients may not be receiving their 3-hours of Medicare-mandated therapy.   Team Interventions: Nursing Interventions Patient/Family Education, Bowel  Management, Skin Care/Wound Management, Psychosocial Support, Bladder Management, Disease Management/Prevention, Medication Management, Cognitive Remediation/Compensation, Discharge Planning  PT interventions Ambulation/gait training, Discharge planning, DME/adaptive equipment instruction, Cognitive remediation/compensation, Functional mobility training, Pain management, Psychosocial support, Splinting/orthotics, UE/LE Strength taining/ROM, Therapeutic Activities, Balance/vestibular training, Community reintegration, Disease management/prevention, Functional electrical stimulation, Neuromuscular re-education, Patient/family education, Skin care/wound management, Stair training, Therapeutic Exercise, UE/LE Coordination activities, Wheelchair propulsion/positioning  OT Interventions Training and development officer, Discharge planning, DME/adaptive equipment instruction, Functional mobility training, Patient/family education, Neuromuscular re-education, Psychosocial support, Skin care/wound managment, Self Care/advanced ADL retraining, Therapeutic Activities, Therapeutic Exercise, UE/LE Strength taining/ROM, UE/LE Coordination activities  SLP Interventions Cognitive remediation/compensation, Cueing hierarchy, Functional tasks, Patient/family education, Therapeutic Activities, Internal/external aids, Environmental controls  TR Interventions    SW/CM Interventions Discharge Planning, Psychosocial Support, Patient/Family Education   Barriers to Discharge MD  Medical stability  Nursing Other (comments), Medical stability    PT Inaccessible home environment, Decreased caregiver support, Medical stability, Home environment access/layout    OT Decreased caregiver support son and daughter in law work during the day  SLP      SW Decreased caregiver support Does not have 24 hr care-family works   International aid/development worker: Destination: PT-Home ,OT- Home , SLP-Home Projected Follow-up: PT-Home health PT, 24 hour  supervision/assistance, OT-  Home health OT, SLP-(TBD) Projected Equipment Needs: PT-To be determined, OT- To be determined, SLP-None recommended by SLP Equipment Details: PT-pt has RW, OT-  Patient/family involved in discharge planning: PT- Patient,  OT-Patient, SLP-Patient  MD ELOS: 10-15d Medical Rehab Prognosis:  Good Assessment:  73 year old right-handed/[place]ehov[place] witness female with history of liver cirrhosis,CKD stage II, 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 Per chart review patient and nursing, lives alone independent with a rolling walker prior to admission and still drives. One level home. She has a son in the area checks on her routinely. Presented 03/14/2019 with right-sided weakness and slurred speech. Cranial CT scan reviewed by neurology suspect left MCA infarction. Minimal right convexity subdural blood without acute hemorrhage. Patient did not receive TPA. Echocardiogram with ejection fraction of 65%, hyperdynamic systolic function. Lower extremity Dopplers negative for DVT. 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 Patient underwent left common carotid arteriogram followed by complete revascularization of symptomatic severe stenosis left ICA per interventional radiology on 03/14/2019.noted acute respiratory failure following revascularization procedure required intubation until 03/15/2019. Patient was initially maintained on pressors for low blood pressurethat slowly improved and monitored with Lasix and Aldactone resumed. Follow-up neurology services no anti-thrombotic at this time given recent SDH and mildly elevated INR 1.9 with thrombocytopenia. Venous Dopplers lower extremities negative. Urine study grade 100,000 Klebsiella, 50,000 enterococcus completing a course ofAugmentin.Her diet has been advanced to mechanical soft. Therapy  evaluations completed and patient was admitted for a comprehensive rehabilitation program.  Patient states she has had frequent liquid stools today.  She received lactulose.  She denies any abdominal pain but feels full.  She also feels gassy.  She indicates that she last had paracentesis in February.  Reviewed chart and she does have mild to moderate ascites as per abdominal ultrasound.   See Team Conference Notes for weekly updates to the plan of care

## 2019-03-19 NOTE — Progress Notes (Signed)
Nutrition Brief Note  Patient identified on the Malnutrition Screening Tool (MST) Report.  Spoke with pt via phone call to room. Pt reports that her appetite is "pretty good" and that she feels hungry frequently throughout the day. Pt endorses getting enough food to eat. Pt states she ate a good breakfast and had some graham crackers and milk for a snack after OT.  Pt reports that she "loves" the sugar-free jello.  Pt reports that she does feel "bloated" all of the time due to ascites but that it has not affected her appetite or PO intake. Pt shares that she has not been losing weight recently but has been gaining weight related to fluid. Pt reports her UBW as 170 lbs.  RD encouraged pt to reach out to MD if nutrition-related issues arise.  Wt Readings from Last 15 Encounters:  03/19/19 112.4 kg  03/18/19 113 kg  03/02/19 99.3 kg  01/16/19 79.1 kg  12/14/18 77.6 kg  11/26/17 104.7 kg  11/19/17 95.7 kg  07/22/17 98 kg  01/22/17 106 kg  12/14/16 106.7 kg  02/23/15 111.6 kg    Body mass index is 43.9 kg/m. Patient meets criteria for obesity class III based on current BMI.   Current diet order is Carb Modified, patient is consuming approximately 100% of meals at this time. Labs and medications reviewed.   No nutrition interventions warranted at this time. If nutrition issues arise, please consult RD.    Gaynell Face, MS, RD, LDN Inpatient Clinical Dietitian Pager: [phone] Weekend/After Hours: [phone]

## 2019-03-19 NOTE — Plan of Care (Signed)
  Problem: Consults Goal: RH STROKE PATIENT EDUCATION Description See Patient Education module for education specifics mod I  Outcome: Progressing Goal: Nutrition Consult-if indicated Description Mod I  Outcome: Progressing Goal: Diabetes Guidelines if Diabetic/Glucose > 140 Description If diabetic or lab glucose is > 140 mg/dl - Initiate Diabetes/Hyperglycemia Guidelines & Document Interventions Mod I  Outcome: Progressing   Problem: RH BOWEL ELIMINATION Goal: RH STG MANAGE BOWEL WITH ASSISTANCE Description STG Manage Bowel with Mod Assistance.  Outcome: Progressing Goal: RH STG MANAGE BOWEL W/MEDICATION W/ASSISTANCE Description STG Manage Bowel with Medication with Mod  Assistance.  Outcome: Progressing   Problem: RH BLADDER ELIMINATION Goal: RH STG MANAGE BLADDER WITH ASSISTANCE Description STG Manage Bladder With modvAssistance  Outcome: Progressing   Problem: RH SKIN INTEGRITY Goal: RH STG SKIN FREE OF INFECTION/BREAKDOWN Description Skin free from infection entire stay on rehab  Outcome: Progressing   Problem: RH SAFETY Goal: RH STG ADHERE TO SAFETY PRECAUTIONS W/ASSISTANCE/DEVICE Description STG Adhere to Safety Precautions With  Mod Assistance/Device.  Outcome: Progressing Goal: RH STG DECREASED RISK OF FALL WITH ASSISTANCE Description STG Decreased Risk of Fall With mod  Assistance.  Outcome: Progressing   Problem: RH PAIN MANAGEMENT Goal: RH STG PAIN MANAGED AT OR BELOW PT'S PAIN GOAL Description Pain less than 2  Outcome: Progressing

## 2019-03-19 NOTE — Progress Notes (Addendum)
[facility] PHYSICAL MEDICINE & REHABILITATION PROGRESS NOTE   Subjective/Complaints:  Pt concerned about wt gain, no abd pain ROS Neg CP, SOB, N/V/D   Objective:   Dg Abd 1 View  Result Date: 03/18/2019 CLINICAL DATA:  Abdominal distention. EXAM: ABDOMEN - 1 VIEW COMPARISON:  None. FINDINGS: Two separate radiographs were obtained to include the entire body habitus. The bowel gas pattern is normal. No radio-opaque calculi or other significant radiographic abnormality are seen. IMPRESSION: Negative. Electronically Signed   By: Staci Righter M.D.   On: 03/18/2019 19:16   Dg Chest Port 1 View  Result Date: 03/17/2019 CLINICAL DATA:  Occluded PICC line. EXAM: PORTABLE CHEST 1 VIEW COMPARISON:  Chest x-ray dated March 15, 2019. FINDINGS: Interval placement of a right upper extremity PICC line with the tip at the cavoatrial junction. Interval removal of the endotracheal and enteric tubes. Stable cardiomediastinal silhouette. Normal pulmonary vascularity. Persistent low lung volumes with mild bibasilar atelectasis. No focal consolidation, pleural effusion, or pneumothorax. No acute osseous abnormality. IMPRESSION: 1. Persistent low lung volumes with mild bibasilar atelectasis. Electronically Signed   By: Titus Dubin M.D.   On: 03/17/2019 11:52   [place] Ascites (abdomen Limited)  Result Date: 03/17/2019 CLINICAL DATA:  73 year old female with cirrhosis.  Query ascites. EXAM: LIMITED ABDOMEN ULTRASOUND FOR ASCITES TECHNIQUE: Limited ultrasound survey for ascites was performed in all four abdominal quadrants. COMPARISON:  Ultrasound 05/15/2018. FINDINGS: Free fluid is demonstrated in both upper quadrants and the left lower quadrant. There is free fluid in the midline. However, the right lower quadrant is spared. IMPRESSION: Ascites is present except in the right lower quadrant. Volume appears mild to moderate. Electronically Signed   By: Genevie Ann M.D.   On: 03/17/2019 11:35   Recent Labs     03/17/19 1117 03/17/19 1627 03/18/19 0400  WBC 7.1  --  7.6  HGB 7.2* 8.8* 7.7*  HCT 19.8* 22.4* RESULTS UNAVAILABLE DUE TO INTERFERING SUBSTANCE  PLT 72*  --  70*   Recent Labs    03/18/19 0400 03/19/19 0610  NA 138 139  K 3.5 3.5  CL 106 107  CO2 22 22  GLUCOSE 160* 108*  BUN 63* 67*  CREATININE 1.95* 1.97*  CALCIUM 8.1* 8.3*    Intake/Output Summary (Last 24 hours) at 03/19/2019 0846 Last data filed at 03/19/2019 0730 Gross per 24 hour  Intake 360 ml  Output -  Net 360 ml     Physical Exam: Vital Signs Blood pressure (!) 101/46, pulse 87, temperature 98.1 F (36.7 C), temperature source Oral, resp. rate 17, weight 112.4 kg, SpO2 98 %.   General: No acute distress Mood and affect are appropriate Heart: Regular rate and rhythm no rubs murmurs or extra sounds Lungs: Clear to auscultation, breathing unlabored, no rales or wheezes Abdomen: Positive bowel sounds, soft nontender to palpation, nondistended Extremities: No clubbing, cyanosis,, 1+ edema bilateral pretibial  Skin: No evidence of breakdown, no evidence of rash Neurologic: Cranial nerves II through XII intact, motor strength is 4/5 in bilateral deltoid, bicep, tricep, grip, hip flexor, knee extensors, ankle dorsiflexor and plantar flexor Sensory exam normal sensation to light touch and proprioception in bilateral upper and lower extremities Cerebellar exam normal finger to nose to finger as well as heel to shin in bilateral upper and lower extremities Musculoskeletal: Full range of motion in all 4 extremities. No joint swelling   Assessment/Plan: 1. Functional deficits secondary to MCA infarct and mobility which require 3+ hours per day of interdisciplinary  therapy in a comprehensive inpatient rehab setting.  Physiatrist is providing close team supervision and 24 hour management of active medical problems listed below.  Physiatrist and rehab team continue to assess barriers to discharge/monitor patient  progress toward functional and medical goals  Care Tool:  Bathing              Bathing assist       Upper Body Dressing/Undressing Upper body dressing        Upper body assist      Lower Body Dressing/Undressing Lower body dressing            Lower body assist       Toileting Toileting    Toileting assist Assist for toileting: Moderate Assistance - Patient 50 - 74%     Transfers Chair/bed transfer  Transfers assist           Locomotion Ambulation   Ambulation assist              Walk 10 feet activity   Assist           Walk 50 feet activity   Assist           Walk 150 feet activity   Assist           Walk 10 feet on uneven surface  activity   Assist           Wheelchair     Assist               Wheelchair 50 feet with 2 turns activity    Assist            Wheelchair 150 feet activity     Assist          Medical Problem List and Plan: 1.Right side weakness and slurred speechsecondary to left MCA infarction status post IR left ICA near occlusive thrombus/revascularization as well as history of recent subdural hematoma January 2020.Marland Kitchen No anti-thrombotic for now given recent SDH and elevated INR per neurology services. CIR PT, OT SLP , discussed with PT, pt participated well yesterday 2. Antithrombotics: -DVT/anticoagulation:Venous Dopplers negative SCDs -antiplatelet therapy: N/A 3. Pain Management:Tylenol as needed 4. Mood:Provide emotional support -antipsychotic agents: N/A 5. Neuropsych: This patientiscapable of making decisions on herown behalf. 6. Skin/Wound Care:Routine skin checks 7. Fluids/Electrolytes/Nutrition:Routine in the mouth with follow-up chemistries 8. Acute respiratory failure after mechanical thrombectomy. Extubated 03/15/2019 9. Uncompensated cirrhosis. Continue Chronulac. 10twice daily.Latest ammonia level  normal 10. Hypertension.Lasix 40 mg daily, Aldactone 12.5 mg daily have been resumed.Monitor with increased mobility.. Vitals:   03/18/19 2009 03/19/19 0440  BP: 106/60 (!) 101/46  Pulse: 84 87  Resp: 18 17  Temp: (!) 97.5 F (36.4 C) 98.1 F (36.7 C)  SpO2: 100% 98%  controlled 4/25 11. Diabetes mellitus. SSI. Hemoglobin A1c 6.2.NovoLog 5 units 3 times a day. Patient on Glucotrol 10 mg twice a day, Glucophage 500 mg twice a day, Victoza 1.8 mg daily at bedtime prior to admission. Resume as needed Controlled 4/24 CBG (last 3)  Recent Labs    03/18/19 1653 03/18/19 2123 03/19/19 0611  GLUCAP 207* 139* 103*  12. Hyperlipidemia. No statin at this time due to cirrhosis 13. Klebsiella and enterococcus UTI. Continue Augmentin through 03/22/2019 and stop 14. CKD stage III. Has not been at baseline since hospitalization in February 2020,4/24 values in line with recent.  Has had increased diuretics to mobilize ascites fluid and avoid repeat paracentesis.   15.  Abdominal distention  in a patient with [name] and cirrhosis.  She has no nausea or vomiting.  She has had multiple stools.  This may be a side effect of her lactulose. KUB without ileus   LOS: 1 days A FACE TO FACE EVALUATION WAS PERFORMED  Charlett Blake 03/19/2019, 8:46 AM

## 2019-03-19 NOTE — Evaluation (Signed)
Speech Language Pathology Assessment and Plan  Patient Details  Name: Monica Pearson MRN: [id number] Date of Birth: 01/24/1946  SLP Diagnosis: Cognitive Impairments  Rehab Potential: Excellent ELOS: 12-14 days     Today's Date: 03/19/2019 SLP Individual Time: 1300-1400 SLP Individual Time Calculation (min): 60 min   Problem List:  Patient Active Problem List   Diagnosis Date Noted  . Acute renal failure superimposed on stage 3 chronic kidney disease ([place]) 03/18/2019  . Left middle cerebral artery stroke ([place]) 03/18/2019  . AKI (acute kidney injury) ([place])   . Hepatic cirrhosis ([place])   . SDH (subdural hematoma) ([place])   . Dyslipidemia   . Tobacco abuse   . Acute blood loss anemia   . Acute respiratory failure ([place])   . Endotracheally intubated 03/15/2019  . Sarcoidosis of lung ([place]) 03/15/2019  . Bacteriuria with pyuria 03/15/2019  . Bradycardia 03/15/2019  . CKD stage 3 due to type 2 diabetes mellitus ([place]) 03/15/2019  . Type 2 diabetes mellitus with complication, with long-term current use of insulin ([place]) 03/15/2019  . Microcytic anemia 03/15/2019  . Thrombocytopenia ([place]) 03/15/2019  . Compromised airway   . Stroke (cerebrum) ([place]) 03/14/2019  . Stenosis of left carotid artery 03/14/2019  . Ascites   . Hypomagnesemia   . Hypokalemia   . Liver cirrhosis secondary to NASH ([place])   . Hyperammonemia ([place]) 01/14/2019  . Subdural hematoma ([place]) 01/14/2019  . Type 2 diabetes mellitus with vascular disease ([place]) 01/14/2019  . Acute hepatic encephalopathy 01/14/2019  . Acute metabolic encephalopathy 22/97/9892  . Hyperlipidemia 07/22/2017  . Hypertension 07/22/2017  . Obstructive sleep apnea treated with continuous positive airway pressure (CPAP) 07/22/2017  . Acute CVA (cerebrovascular accident) ([place]) 12/15/2016  . TIA (transient ischemic attack) 12/14/2016  . GERD (gastroesophageal reflux disease) 12/14/2016  . Insulin dependent diabetes mellitus ([place]) 12/14/2016  . No blood  products 12/14/2016   Past Medical History:  Past Medical History:  Diagnosis Date  . Cirrhosis ([place])   . Diabetes mellitus without complication ([place])   . Diabetic retinopathy ([place])   . Diabetic retinopathy ([place])   . GERD (gastroesophageal reflux disease)   . High cholesterol   . Hypertension   . Patient is Jehovah's Witness   . Sarcoidosis   . Stroke ([place])   . Subdural hematoma [facility])    Past Surgical History:  Past Surgical History:  Procedure Laterality Date  . CHOLECYSTECTOMY    . IR CT HEAD LTD  03/14/2019  . IR PERCUTANEOUS ART THROMBECTOMY/INFUSION INTRACRANIAL INC DIAG ANGIO  03/14/2019  . KNEE SURGERY    . 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 Per chart review patient and nursing, lives alone independent with a rolling walker prior to admission and still drives. One level home. She has a son in the area checks on her routinely. Presented 03/14/2019 with right-sided weakness and slurred speech. Cranial CT scan reviewed by neurology suspect left MCA infarction. Minimal right convexity subdural blood without acute hemorrhage. Patient did not receive TPA. Echocardiogram with ejection fraction of 65%, hyperdynamic systolic function. Lower extremity Dopplers negative for DVT. CT angiogram of head and neck nonocclusive thrombus within the left internal carotid artery at  its origin length approximately 12 mm. Patient underwent left common carotid arteriogram followed by complete revascularization of symptomatic severe stenosis left ICA per interventional radiology on 03/14/2019.noted acute respiratory failure  following revascularization procedure required intubation until 03/15/2019. Patient was initially maintained on pressors for low blood pressurethat slowly improved and monitored with Lasix and Aldactone resumed. Follow-up neurology services no anti-thrombotic at this time given recent SDH and mildly elevated INR 1.9 with thrombocytopenia. Venous Dopplers lower extremities negative. Urine study grade 100,000 Klebsiella, 50,000 enterococcus completing a course ofAugmentin.Her diet has been advanced to mechanical soft. Therapy evaluations completed and patient was admitted for a comprehensive rehabilitation program 03/18/19.  Patient demonstrates mild cognitive impairments impacting short-term recall and complex problem solving which impacts her safety with functional and familiar tasks safely. Minimal higher-level word-finding errors were also noted at the conversation level. Patient would benefit from skilled SLP intervention to maximize her cognitive function and overall functional independence prior to discharge.    Skilled Therapeutic Interventions          Administered a cognitive-linguistic evaluation, please see above for details. Educated patient in regards to her current cognitive impairments and goals of skilled SLP intervention. She verbalized understating and agreement.    SLP Assessment  Patient will need skilled [facility] during CIR admission    Recommendations  Oral Care Recommendations: Oral care BID Recommendations for Other Services: Neuropsych consult Patient destination: Home Follow up Recommendations: (TBD) Equipment Recommended: None recommended by SLP    SLP Frequency 3 to 5 out of 7 days   SLP Duration  SLP Intensity  SLP Treatment/Interventions 12-14 days   Minumum of 1-2 x/day, 30 to 90 minutes  Cognitive remediation/compensation;Cueing hierarchy;Functional tasks;Patient/family education;Therapeutic Activities;Internal/external  aids;Environmental controls    Pain Pain Assessment Pain Score: 0-No pain  Prior Functioning Type of Home: House  Lives With: Alone Available Help at Discharge: Family;Available PRN/intermittently(son and daughter-in-law) Vocation: Retired  Industrial/product designer Term Goals: Week 1: SLP Short Term Goal 1 (Week 1): Patient will recall new, daily information with supervision verbal and visual cues.  SLP Short Term Goal 2 (Week 1): Patient will demonstrate complex problem solving for functional tasks with supervision verbal cues.   Refer to Care Plan for Long Term Goals  Recommendations for other services: None   Discharge Criteria: Patient will be discharged from SLP if patient refuses treatment 3 consecutive times without medical reason, if treatment goals not met, if there is a change in medical status, if patient makes no progress towards goals or if patient is discharged from hospital.  The above assessment, treatment plan, treatment alternatives and goals were discussed and mutually agreed upon: by patient  Hymie Gorr 03/19/2019, 3:57 PM

## 2019-03-19 NOTE — Progress Notes (Signed)
Inpatient Rehabilitation  Patient information reviewed and entered into eRehab system by Denaly Gatling M. Briarrose Shor, M.A., CCC/SLP, PPS Coordinator.  Information including medical coding, functional ability and quality indicators will be reviewed and updated through discharge.    

## 2019-03-20 ENCOUNTER — Other Ambulatory Visit: Payer: Self-pay

## 2019-03-20 ENCOUNTER — Inpatient Hospital Stay (HOSPITAL_COMMUNITY): Payer: Medicare Other | Admitting: Occupational Therapy

## 2019-03-20 ENCOUNTER — Inpatient Hospital Stay (HOSPITAL_COMMUNITY): Payer: Medicare Other | Admitting: Physical Therapy

## 2019-03-20 ENCOUNTER — Encounter (HOSPITAL_COMMUNITY): Payer: Self-pay | Admitting: *Deleted

## 2019-03-20 LAB — PROTIME-INR
INR: 1.5 — ABNORMAL HIGH (ref 0.8–1.2)
Prothrombin Time: 18.2 seconds — ABNORMAL HIGH (ref 11.4–15.2)

## 2019-03-20 LAB — GLUCOSE, CAPILLARY
Glucose-Capillary: 125 mg/dL — ABNORMAL HIGH (ref 70–99)
Glucose-Capillary: 168 mg/dL — ABNORMAL HIGH (ref 70–99)
Glucose-Capillary: 160 mg/dL — ABNORMAL HIGH (ref 70–99)
Glucose-Capillary: 182 mg/dL — ABNORMAL HIGH (ref 70–99)

## 2019-03-20 LAB — CULTURE, BLOOD (ROUTINE X 2)
Culture: NO GROWTH
Culture: NO GROWTH
Special Requests: ADEQUATE
Special Requests: ADEQUATE

## 2019-03-20 NOTE — Plan of Care (Signed)
  Problem: Consults Goal: RH STROKE PATIENT EDUCATION Description See Patient Education module for education specifics mod I  Outcome: Progressing Goal: Nutrition Consult-if indicated Description Mod I  Outcome: Progressing Goal: Diabetes Guidelines if Diabetic/Glucose > 140 Description If diabetic or lab glucose is > 140 mg/dl - Initiate Diabetes/Hyperglycemia Guidelines & Document Interventions Mod I  Outcome: Progressing   Problem: RH BOWEL ELIMINATION Goal: RH STG MANAGE BOWEL WITH ASSISTANCE Description STG Manage Bowel with Mod Assistance.  Outcome: Progressing Goal: RH STG MANAGE BOWEL W/MEDICATION W/ASSISTANCE Description STG Manage Bowel with Medication with Mod  Assistance.  Outcome: Progressing   Problem: RH BLADDER ELIMINATION Goal: RH STG MANAGE BLADDER WITH ASSISTANCE Description STG Manage Bladder With modvAssistance  Outcome: Progressing   Problem: RH SKIN INTEGRITY Goal: RH STG SKIN FREE OF INFECTION/BREAKDOWN Description Skin free from infection entire stay on rehab  Outcome: Progressing   Problem: RH SAFETY Goal: RH STG ADHERE TO SAFETY PRECAUTIONS W/ASSISTANCE/DEVICE Description STG Adhere to Safety Precautions With  Mod Assistance/Device.  Outcome: Progressing Goal: RH STG DECREASED RISK OF FALL WITH ASSISTANCE Description STG Decreased Risk of Fall With mod  Assistance.  Outcome: Progressing   Problem: RH PAIN MANAGEMENT Goal: RH STG PAIN MANAGED AT OR BELOW PT'S PAIN GOAL Description Pain less than 2  Outcome: Progressing

## 2019-03-20 NOTE — Progress Notes (Signed)
Physical Therapy Session Note  Patient Details  Name: Monica Pearson MRN: [id number] Date of Birth: 24-Jun-1946  Today's Date: 03/20/2019 PT Individual Time: 0801-0905 and 1101-1202 PT Individual Time Calculation (min): 64 min and 61 min  Short Term Goals: Week 1:  PT Short Term Goal 1 (Week 1): Pt will performed supine<>sit with min assist PT Short Term Goal 2 (Week 1): Pt will ambulate at least 73f using LRAD with min assist PT Short Term Goal 3 (Week 1): Pt will ascend/descend 4 steps with min assist PT Short Term Goal 4 (Week 1): Pt will perform bed<>chair transfers with CGA  Skilled Therapeutic Interventions/Progress Updates:   Session 1: Pt received sitting in bed and agreeable to therapy session. Pt continues to demonstrate wheezing breaths with SpO2 monitored with activity to be >95% on room air throughout session. Pt performed supine to sit, HOB elevated and using bedrails, with mod assist for B LE management and trunk upright as well as significantly increased time/effort. Pt continues to demonstrate weeping skin along her hips/thigh/lower abdominal region bilaterally - MD notified. Pt reporting she needs to use bathroom. Performed sit to stand from EOB to RW with CGA for steadying and ambulated ~1100fto bathroom using RW with CGA for steadying. Performed sit<>stand from toilet to RW with min assist for lifting/lowering due to lower surface. Pt continent of bowel and bladder on toilet requiring total assist for peri-care and LB clothing management while standing with B UE support on RW with CGA/close supervision for balance safety. Pt reports she has had intermittent changes in her vision with difficulty describing the change - MD notified. Pt ambulated ~1048fsing RW from toilet to w/c with CGA for steadying. Pt transported in w/c to/form therapy gym for time management and energy conservation. Pt ambulated 143f22fing RW with CGA for steadying throughout with significantly decreased gait speed  (0.116m/46mith decreased B LE step length and foot clearance. Pt performed sit<>stand from w/c<>RW with CGA for steadying throughout. Pt performed 2 bouts of standing marching focusing on increased activity tolerance and B LE hip flexor functional strengthening with CGA for steadying. Pt returned to room and left sitting in w/c with needs in reach and seat belt alarm on.   Session 2: Pt received sitting in w/c and agreeable to therapy session. Pt transported in w/c to/from therapy gym. Pt performed sit to stand from w/c to RW with min assist for lifting. Pt ambulated ~75ft 22fg RW with CGA throughout for steadying - 1x standing rest break - continues to demonstrate significantly decreased gait speed with decreased B LE step length and foot clearance. Pt performed obstacle navigation ambulating with RW around cones with CGA for steadying and pt demonstrating good AD management - ambulates at significantly decreased speed requiring significant time to perform task as well as 1x standing rest break. Pt performed stand from RW to sit EOM with CGA for steadying. Pt performed 3 bouts of dynamic standing balance task of tossing horseshoes with initially 1UE support on RW progressed to no UE support with CGA for steadying throughout. Pt performed sit<>stand from EOM to RW/noAD throughout session with CGA. Pt reports lightheadedness/dizziness after standing with attempts at alleviating symptoms with marching in place and overhead arm movements with pt reporting minimal improvement - vitals assessed with BP of 116/60 (MAP 74) HR 85bpm. Pt transported in w/c to day room. Performed stand pivot transfer w/c<>NuStep using RW with CGA. Therapist assisted with B LE placement onto pedals and pt performed 7 minures  of B LE alternating, reciprocal movement against level 4 resistance for a total of 340 steps with cuing for maintaining >50 step rate with a focus on activity tolerance and B LE strengthening. Pt's vitals reassessed HR  91bpm, BP 127/66 in sitting after activity with pt reporting a slight lightheaded feeling still. Pt transported back to room in w/c and left sitting in w/c with needs in reach and seat belt alarm on.  Therapy Documentation Precautions:  Precautions Precautions: Fall, Other (comment) Precaution Comments: weeping skin Restrictions Weight Bearing Restrictions: No  Pain:   Session 1: Denies pain during session.  Session 2: Denies pain during session.     Therapy/Group: Individual Therapy  Tawana Scale, PT, DPT 03/20/2019, 7:42 AM

## 2019-03-20 NOTE — Progress Notes (Signed)
[facility] PHYSICAL MEDICINE & REHABILITATION PROGRESS NOTE   Subjective/Complaints:  Pt concerned about wt gain, no abd pain Has daily weight ordered ROS Neg CP, SOB, N/V/D   Objective:   Dg Abd 1 View  Result Date: 03/18/2019 CLINICAL DATA:  Abdominal distention. EXAM: ABDOMEN - 1 VIEW COMPARISON:  None. FINDINGS: Two separate radiographs were obtained to include the entire body habitus. The bowel gas pattern is normal. No radio-opaque calculi or other significant radiographic abnormality are seen. IMPRESSION: Negative. Electronically Signed   By: Staci Righter M.D.   On: 03/18/2019 19:16   Recent Labs    03/18/19 0400 03/19/19 0610  WBC 7.6 6.7  HGB 7.7* 7.9*  HCT RESULTS UNAVAILABLE DUE TO INTERFERING SUBSTANCE RESULTS UNAVAILABLE DUE TO INTERFERING SUBSTANCE  PLT 70* 64*   Recent Labs    03/18/19 0400 03/19/19 0610  NA 138 139  K 3.5 3.5  CL 106 107  CO2 22 22  GLUCOSE 160* 108*  BUN 63* 67*  CREATININE 1.95* 1.97*  CALCIUM 8.1* 8.3*    Intake/Output Summary (Last 24 hours) at 03/20/2019 0848 Last data filed at 03/20/2019 0330 Gross per 24 hour  Intake 560 ml  Output 700 ml  Net -140 ml     Physical Exam: Vital Signs Blood pressure (!) 109/56, pulse 86, temperature 98.4 F (36.9 C), temperature source Oral, resp. rate 20, weight 112.7 kg, SpO2 100 %.   General: No acute distress Mood and affect are appropriate Heart: Regular rate and rhythm no rubs murmurs or extra sounds Lungs: Clear to auscultation, breathing unlabored, no rales or wheezes Abdomen: Positive bowel sounds, soft nontender to palpation, nondistended Extremities: No clubbing, cyanosis,, 1+ edema bilateral pretibial  Skin: No evidence of breakdown, no evidence of rash Neurologic: Cranial nerves II through XII intact, motor strength is 4/5 in bilateral deltoid, bicep, tricep, grip, hip flexor, knee extensors, ankle dorsiflexor and plantar flexor Sensory exam normal sensation to light touch  and proprioception in bilateral upper and lower extremities Cerebellar exam normal finger to nose to finger as well as heel to shin in bilateral upper and lower extremities Musculoskeletal: Full range of motion in all 4 extremities. No joint swelling   Assessment/Plan: 1. Functional deficits secondary to MCA infarct and mobility which require 3+ hours per day of interdisciplinary therapy in a comprehensive inpatient rehab setting.  Physiatrist is providing close team supervision and 24 hour management of active medical problems listed below.  Physiatrist and rehab team continue to assess barriers to discharge/monitor patient progress toward functional and medical goals  Care Tool:  Bathing    Body parts bathed by patient: Right arm, Left arm, Chest, Abdomen, Right upper leg, Left upper leg, Face   Body parts bathed by helper: Front perineal area, Buttocks, Left lower leg, Right lower leg     Bathing assist Assist Level: Moderate Assistance - Patient 50 - 74%     Upper Body Dressing/Undressing Upper body dressing   What is the patient wearing?: Pull over shirt    Upper body assist Assist Level: Moderate Assistance - Patient 50 - 74%    Lower Body Dressing/Undressing Lower body dressing      What is the patient wearing?: Pants     Lower body assist Assist for lower body dressing: Total Assistance - Patient < 25%     Toileting Toileting    Toileting assist Assist for toileting: Moderate Assistance - Patient 50 - 74%     Transfers Chair/bed transfer  Transfers assist  Chair/bed transfer assist level: Minimal Assistance - Patient > 75%     Locomotion Ambulation   Ambulation assist      Assist level: Minimal Assistance - Patient > 75% Assistive device: Walker-rolling Max distance: 40f   Walk 10 feet activity   Assist     Assist level: Minimal Assistance - Patient > 75%     Walk 50 feet activity   Assist Walk 50 feet with 2 turns activity  did not occur: Safety/medical concerns         Walk 150 feet activity   Assist Walk 150 feet activity did not occur: Safety/medical concerns         Walk 10 feet on uneven surface  activity   Assist     Assist level: Minimal Assistance - Patient > 75%(up/down ramp) Assistive device: WAeronautical engineerWill patient use wheelchair at discharge?: (TBD)             Wheelchair 50 feet with 2 turns activity    Assist            Wheelchair 150 feet activity     Assist          Medical Problem List and Plan: 1.Right side weakness and slurred speechsecondary to left MCA infarction status post IR left ICA near occlusive thrombus/revascularization as well as history of recent subdural hematoma January 2020..Marland KitchenNo anti-thrombotic for now given recent SDH and elevated INR per neurology services. CIR PT, OT SLP , discussed with PT, pt participated well yesterday 2. Antithrombotics: -DVT/anticoagulation:Venous Dopplers negative SCDs -antiplatelet therapy: N/A 3. Pain Management:Tylenol as needed 4. Mood:Provide emotional support -antipsychotic agents: N/A 5. Neuropsych: This patientiscapable of making decisions on herown behalf. 6. Skin/Wound Care:Routine skin checks 7. Fluids/Electrolytes/Nutrition:Routine in the mouth with follow-up chemistries 8. Acute respiratory failure after mechanical thrombectomy. Extubated 03/15/2019 9. Uncompensated cirrhosis. Continue Chronulac. 10twice daily.Latest ammonia level normal 10. Hypertension.Lasix 40 mg daily, Aldactone 12.5 mg daily have been resumed.Monitor with increased mobility.. Vitals:   03/19/19 1946 03/20/19 0332  BP: 123/77 (!) 109/56  Pulse: 91 86  Resp: 18 20  Temp: (!) 97.5 F (36.4 C) 98.4 F (36.9 C)  SpO2: 99% 100%  controlled 4/25 11. Diabetes mellitus. SSI. Hemoglobin A1c 6.2.NovoLog 5 units 3 times a day. Patient on Glucotrol 10  mg twice a day, Glucophage 500 mg twice a day, Victoza 1.8 mg daily at bedtime prior to admission. Resume as needed Controlled 4/24 CBG (last 3)  Recent Labs    03/19/19 1642 03/19/19 2132 03/20/19 0646  GLUCAP 203* 132* 125*  12. Hyperlipidemia. No statin at this time due to cirrhosis 13. Klebsiella and enterococcus UTI. Continue Augmentin through 03/22/2019 and stop 14. CKD stage III. Has not been at baseline since hospitalization in February 2020,4/24 values in line with recent.  Has had increased diuretics to mobilize ascites fluid and avoid repeat paracentesis.   15.  Abdominal distention in a patient with NKarlene Linemanand cirrhosis.  She has no nausea or vomiting.  She has had multiple stools.  This may be a side effect of her lactulose. KUB without ileus  16.  Edema likely related to low alb pre tib and ascities on diuretic  Filed Weights   03/18/19 1647 03/19/19 0500 03/20/19 0514  Weight: 106.9 kg 112.4 kg 112.7 kg  Would not increase diuretic due to renal fxn, may need nephro to assist on this if weights increase , no sign of ht failure LOS: 2  days A FACE TO FACE EVALUATION WAS PERFORMED  Charlett Blake 03/20/2019, 8:48 AM

## 2019-03-20 NOTE — Progress Notes (Signed)
I&O cath done at 1800. Bladder scan was noted to be picking up fluid volume from the swelling around abdomen which showed 624. Cath volume was 176m. Bladder scanner tested on legs and indicated presence of fluids. Will inform incoming staff. Continue plan of care.   Ashani Pumphrey W Anjalee Cope

## 2019-03-20 NOTE — Progress Notes (Signed)
Occupational Therapy Session Note  Patient Details  Name: Monica Pearson MRN: [id number] Date of Birth: Sep 22, 1946  Today's Date: 03/20/2019 OT Individual Time: 9935-7017 OT Individual Time Calculation (min): 81 min    Short Term Goals: Week 1:  OT Short Term Goal 1 (Week 1): Pt will demonstrate improved R hand coordination to be able use her utensils to cut a piece of food.  OT Short Term Goal 2 (Week 1): Pt will use AE to don pant with min A.   OT Short Term Goal 3 (Week 1): pt will use AE to don socks with set up. OT Short Term Goal 4 (Week 1): Pt will sit to stand to RW with S. OT Short Term Goal 5 (Week 1): Pt will complete 2/3 steps of toileting with S.  Skilled Therapeutic Interventions/Progress Updates:    Pt worked on bathing, dressing, and grooming tasks at the sink during session.  She was able to complete oral hygiene, washing her face, and combing her hair with supervision to start the session.  She completed UB bathing with supervision and donned hospital gown.  LB bathing was completed with mod assist using LH sponge and reacher.  She also used the reacher for removal of gripper socks prior to washing, but needed mod assist to complete.  Therapist removed knee high TEDs.  She needed total assist to donn gripper socks after completion of bathing secondary to decreased time.  Min assist for transfer back to the bed to rest per pt's choice.  She transitioned to supine with mod assist for lifting her LEs.  Noted pt with blood underneath PICC line dressing.  Notified nursing of issue as well.  Pt left with call button and phone in reach with bed alarm in place.    Therapy Documentation Precautions:  Precautions Precautions: Fall, Other (comment) Precaution Comments: weeping skin Restrictions Weight Bearing Restrictions: No   Pain: Pain Assessment Pain Score: 0-No pain ADL: See Care Tool Section for some details of ADL  Therapy/Group: Individual Therapy  Andren Bethea  OTR/L 03/20/2019, 3:50 PM

## 2019-03-21 LAB — GLUCOSE, CAPILLARY
Glucose-Capillary: 177 mg/dL — ABNORMAL HIGH (ref 70–99)
Glucose-Capillary: 208 mg/dL — ABNORMAL HIGH (ref 70–99)
Glucose-Capillary: 178 mg/dL — ABNORMAL HIGH (ref 70–99)
Glucose-Capillary: 197 mg/dL — ABNORMAL HIGH (ref 70–99)

## 2019-03-21 LAB — PROTIME-INR
INR: 1.6 — ABNORMAL HIGH (ref 0.8–1.2)
Prothrombin Time: 18.8 seconds — ABNORMAL HIGH (ref 11.4–15.2)

## 2019-03-21 NOTE — Plan of Care (Signed)
  Problem: Consults Goal: RH STROKE PATIENT EDUCATION Description See Patient Education module for education specifics mod I  Outcome: Progressing Goal: Nutrition Consult-if indicated Description Mod I  Outcome: Progressing Goal: Diabetes Guidelines if Diabetic/Glucose > 140 Description If diabetic or lab glucose is > 140 mg/dl - Initiate Diabetes/Hyperglycemia Guidelines & Document Interventions Mod I  Outcome: Progressing   Problem: RH BOWEL ELIMINATION Goal: RH STG MANAGE BOWEL WITH ASSISTANCE Description STG Manage Bowel with Mod Assistance.  Outcome: Progressing Goal: RH STG MANAGE BOWEL W/MEDICATION W/ASSISTANCE Description STG Manage Bowel with Medication with Mod  Assistance.  Outcome: Progressing   Problem: RH BLADDER ELIMINATION Goal: RH STG MANAGE BLADDER WITH ASSISTANCE Description STG Manage Bladder With modvAssistance  Outcome: Progressing   Problem: RH SKIN INTEGRITY Goal: RH STG SKIN FREE OF INFECTION/BREAKDOWN Description Skin free from infection entire stay on rehab  Outcome: Progressing   Problem: RH SAFETY Goal: RH STG ADHERE TO SAFETY PRECAUTIONS W/ASSISTANCE/DEVICE Description STG Adhere to Safety Precautions With  Mod Assistance/Device.  Outcome: Progressing Goal: RH STG DECREASED RISK OF FALL WITH ASSISTANCE Description STG Decreased Risk of Fall With mod  Assistance.  Outcome: Progressing   Problem: RH PAIN MANAGEMENT Goal: RH STG PAIN MANAGED AT OR BELOW PT'S PAIN GOAL Description Pain less than 2  Outcome: Progressing

## 2019-03-21 NOTE — Progress Notes (Signed)
[place] PHYSICAL MEDICINE & REHABILITATION PROGRESS NOTE   Subjective/Complaints:  RN notes low cath volume despite good oral intake. DIscussed 3rd spacing ROS Neg CP, SOB, N/V/D   Objective:   No results found. Recent Labs    03/19/19 0610  WBC 6.7  HGB 7.9*  HCT RESULTS UNAVAILABLE DUE TO INTERFERING SUBSTANCE  PLT 64*   Recent Labs    03/19/19 0610  NA 139  K 3.5  CL 107  CO2 22  GLUCOSE 108*  BUN 67*  CREATININE 1.97*  CALCIUM 8.3*    Intake/Output Summary (Last 24 hours) at 03/21/2019 0751 Last data filed at 03/21/2019 0350 Gross per 24 hour  Intake -  Output 600 ml  Net -600 ml     Physical Exam: Vital Signs Blood pressure 122/60, pulse (!) 105, temperature 98.5 F (36.9 C), temperature source Oral, resp. rate 18, height 5' 3"  (1.6 m), weight 112.7 kg, SpO2 99 %.   General: No acute distress Mood and affect are appropriate Heart: Regular rate and rhythm no rubs murmurs or extra sounds Lungs: Clear to auscultation, breathing unlabored, no rales or wheezes Abdomen: Positive bowel sounds, soft nontender to palpation, nondistended Extremities: No clubbing, cyanosis,, 1+ edema bilateral pretibial  Skin: No evidence of breakdown, no evidence of rash Neurologic: Cranial nerves II through XII intact, motor strength is 4/5 in bilateral deltoid, bicep, tricep, grip, hip flexor, knee extensors, ankle dorsiflexor and plantar flexor Sensory exam normal sensation to light touch and proprioception in bilateral upper and lower extremities Cerebellar exam normal finger to nose to finger as well as heel to shin in bilateral upper and lower extremities Musculoskeletal: Full range of motion in all 4 extremities. No joint swelling   Assessment/Plan: 1. Functional deficits secondary to MCA infarct and mobility which require 3+ hours per day of interdisciplinary therapy in a comprehensive inpatient rehab setting.  Physiatrist is providing close team supervision and 24  hour management of active medical problems listed below.  Physiatrist and rehab team continue to assess barriers to discharge/monitor patient progress toward functional and medical goals  Care Tool:  Bathing    Body parts bathed by patient: Right arm, Left arm, Chest, Abdomen, Right upper leg, Left upper leg, Face, Front perineal area, Buttocks, Right lower leg, Left lower leg(with reacher and LH sponge)   Body parts bathed by helper: Front perineal area, Buttocks, Left lower leg, Right lower leg     Bathing assist Assist Level: Moderate Assistance - Patient 50 - 74%     Upper Body Dressing/Undressing Upper body dressing   What is the patient wearing?: Hospital gown only    Upper body assist Assist Level: Minimal Assistance - Patient > 75%    Lower Body Dressing/Undressing Lower body dressing      What is the patient wearing?: Pants     Lower body assist Assist for lower body dressing: Total Assistance - Patient < 25%     Toileting Toileting    Toileting assist Assist for toileting: Moderate Assistance - Patient 50 - 74%     Transfers Chair/bed transfer  Transfers assist     Chair/bed transfer assist level: Moderate Assistance - Patient 50 - 74%     Locomotion Ambulation   Ambulation assist      Assist level: Contact Guard/Touching assist Assistive device: Walker-rolling Max distance: 172f   Walk 10 feet activity   Assist     Assist level: Contact Guard/Touching assist Assistive device: Walker-rolling   Walk 50 feet activity  Assist Walk 50 feet with 2 turns activity did not occur: Safety/medical concerns  Assist level: Contact Guard/Touching assist Assistive device: Walker-rolling    Walk 150 feet activity   Assist Walk 150 feet activity did not occur: Safety/medical concerns         Walk 10 feet on uneven surface  activity   Assist     Assist level: Minimal Assistance - Patient > 75%(up/down ramp) Assistive device:  Walker-rolling   Wheelchair     Assist Will patient use wheelchair at discharge?: No(anticipate pt will not use w/c at discharge)             Wheelchair 50 feet with 2 turns activity    Assist            Wheelchair 150 feet activity     Assist          Medical Problem List and Plan: 1.Right side weakness and slurred speechsecondary to left MCA infarction status post IR left ICA near occlusive thrombus/revascularization as well as history of recent subdural hematoma January 2020.Marland Kitchen No anti-thrombotic for now given recent SDH and elevated INR per neurology services. CIR PT, OT SLP , discussed with PT, pt participated well yesterday 2. Antithrombotics: -DVT/anticoagulation:Venous Dopplers negative SCDs -antiplatelet therapy: N/A 3. Pain Management:Tylenol as needed 4. Mood:Provide emotional support -antipsychotic agents: N/A 5. Neuropsych: This patientiscapable of making decisions on herown behalf. 6. Skin/Wound Care:Routine skin checks 7. Fluids/Electrolytes/Nutrition:Routine in the mouth with follow-up chemistries 8. Acute respiratory failure after mechanical thrombectomy. Extubated 03/15/2019 9. Uncompensated cirrhosis. Continue Chronulac. 10twice daily.Latest ammonia level normal 10. Hypertension.Lasix 40 mg daily, Aldactone 12.5 mg daily have been resumed.Monitor with increased mobility.. Vitals:   03/20/19 2002 03/21/19 0346  BP: (!) 120/41 122/60  Pulse: 100 (!) 105  Resp: 16 18  Temp: 98 F (36.7 C) 98.5 F (36.9 C)  SpO2: 98% 99%  controlled 4/25 11. Diabetes mellitus. SSI. Hemoglobin A1c 6.2.NovoLog 5 units 3 times a day. Patient on Glucotrol 10 mg twice a day, Glucophage 500 mg twice a day, Victoza 1.8 mg daily at bedtime prior to admission. Resume as needed Controlled 4/24 CBG (last 3)  Recent Labs    03/20/19 1718 03/20/19 2112 03/21/19 0655  GLUCAP 160* 182* 177*  12. Hyperlipidemia. No statin  at this time due to cirrhosis 13. Klebsiella and enterococcus UTI. Continue Augmentin through 03/22/2019 and stop 14. CKD stage III. Has not been at baseline since hospitalization in February 2020,4/24 values in line with recent.  Has had increased diuretics to mobilize ascites fluid and avoid repeat paracentesis.   15.  Abdominal distention in a patient with [name] and cirrhosis.  She has no nausea or vomiting.  She has had multiple stools. Improved off lactulose. KUB without ileus  Will need to monitor serum ammonia May need GI f/u , ? Need for paracentesis  16.  Edema likely related to low alb pre tib and ascities on diuretic  Filed Weights   03/18/19 1647 03/19/19 0500 03/20/19 0514  Weight: 106.9 kg 112.4 kg 112.7 kg  Would not increase diuretic due to renal fxn, may need nephro to assist on this if weights increase , no sign of ht failure LOS: 3 days A FACE TO FACE EVALUATION WAS PERFORMED  Charlett Blake 03/21/2019, 7:51 AM

## 2019-03-22 ENCOUNTER — Inpatient Hospital Stay (HOSPITAL_COMMUNITY): Payer: Medicare Other | Admitting: Occupational Therapy

## 2019-03-22 ENCOUNTER — Inpatient Hospital Stay (HOSPITAL_COMMUNITY): Payer: Medicare Other

## 2019-03-22 LAB — GLUCOSE, CAPILLARY
Glucose-Capillary: 216 mg/dL — ABNORMAL HIGH (ref 70–99)
Glucose-Capillary: 208 mg/dL — ABNORMAL HIGH (ref 70–99)
Glucose-Capillary: 203 mg/dL — ABNORMAL HIGH (ref 70–99)
Glucose-Capillary: 236 mg/dL — ABNORMAL HIGH (ref 70–99)

## 2019-03-22 LAB — PROTIME-INR
INR: 1.6 — ABNORMAL HIGH (ref 0.8–1.2)
Prothrombin Time: 19.1 seconds — ABNORMAL HIGH (ref 11.4–15.2)

## 2019-03-22 NOTE — Plan of Care (Signed)
  Problem: Consults Goal: RH STROKE PATIENT EDUCATION Description See Patient Education module for education specifics mod I  Outcome: Progressing   Problem: RH BOWEL ELIMINATION Goal: RH STG MANAGE BOWEL WITH ASSISTANCE Description STG Manage Bowel with Mod Assistance.  Outcome: Progressing

## 2019-03-22 NOTE — Progress Notes (Signed)
Blood sugar at 12:00pm today (03/22/19) is 208. Glucometer has not sent value to Epic.

## 2019-03-22 NOTE — Progress Notes (Signed)
Occupational Therapy Session Note  Patient Details  Name: Monica Pearson MRN: [id number] Date of Birth: 21-Nov-1946  Today's Date: 03/22/2019 OT Individual Time: 1230-1310 OT Individual Time Calculation (min): 40 min    Short Term Goals: Week 1:  OT Short Term Goal 1 (Week 1): Pt will demonstrate improved R hand coordination to be able use her utensils to cut a piece of food.  OT Short Term Goal 2 (Week 1): Pt will use AE to don pant with min A.   OT Short Term Goal 3 (Week 1): pt will use AE to don socks with set up. OT Short Term Goal 4 (Week 1): Pt will sit to stand to RW with S. OT Short Term Goal 5 (Week 1): Pt will complete 2/3 steps of toileting with S.  Skilled Therapeutic Interventions/Progress Updates:    Upon entering the room, pt in bed eating lunch with c/o fatigue and requesting to finishing eating before participation in OT intervention. RN also entering the room and verbalized pt's need to be in bed at end of session secondary to I & O cath needed. While pt eating, OT provided pt with paper handout regarding energy conservation. OT reviewing several functional examples as well as expectations for [facility] recommendations. Pt encouraged to be an advocate for themselves once home. Pt does express concerns but feels that if family is supportive she will be "just fine". Pt continues to eat at end of session and remains in bed. Call bell and all needed items within reach. Bed alarm activated.   Therapy Documentation Precautions:  Precautions Precautions: Fall, Other (comment) Precaution Comments: weeping skin Restrictions Weight Bearing Restrictions: No Pain: Pain Assessment Pain Score: 0-No pain   Therapy/Group: Individual Therapy  Gypsy Decant 03/22/2019, 1:49 PM

## 2019-03-22 NOTE — Progress Notes (Signed)
[place] PHYSICAL MEDICINE & REHABILITATION PROGRESS NOTE   Subjective/Complaints:  Good appetite, no abd pain, having BMs but no diarrhea off lactulose  ROS Neg CP, SOB, N/V/D   Objective:   No results found. No results for input(s): WBC, HGB, HCT, PLT in the last 72 hours. No results for input(s): NA, K, CL, CO2, GLUCOSE, BUN, CREATININE, CALCIUM in the last 72 hours.  Intake/Output Summary (Last 24 hours) at 03/22/2019 0731 Last data filed at 03/22/2019 0310 Gross per 24 hour  Intake 1160 ml  Output 500 ml  Net 660 ml     Physical Exam: Vital Signs Blood pressure 101/62, pulse 95, temperature 98.5 F (36.9 C), temperature source Oral, resp. rate 16, height 5' 3"  (1.6 m), weight 116 kg, SpO2 98 %.   General: No acute distress Mood and affect are appropriate Heart: Regular rate and rhythm no rubs murmurs or extra sounds Lungs: Clear to auscultation, breathing unlabored, no rales or wheezes Abdomen: Positive bowel sounds, soft nontender to palpation, nondistended Extremities: No clubbing, cyanosis,, 1+ edema bilateral pretibial  Skin: No evidence of breakdown, no evidence of rash Neurologic: Cranial nerves II through XII intact, motor strength is 4/5 in bilateral deltoid, bicep, tricep, grip, hip flexor, knee extensors, ankle dorsiflexor and plantar flexor Sensory exam normal sensation to light touch and proprioception in bilateral upper and lower extremities Cerebellar exam normal finger to nose to finger as well as heel to shin in bilateral upper and lower extremities Musculoskeletal: Full range of motion in all 4 extremities. No joint swelling   Assessment/Plan: 1. Functional deficits secondary to MCA infarct and mobility which require 3+ hours per day of interdisciplinary therapy in a comprehensive inpatient rehab setting.  Physiatrist is providing close team supervision and 24 hour management of active medical problems listed below.  Physiatrist and rehab team  continue to assess barriers to discharge/monitor patient progress toward functional and medical goals  Care Tool:  Bathing    Body parts bathed by patient: Right arm, Left arm, Chest, Abdomen, Right upper leg, Left upper leg, Face, Front perineal area, Buttocks, Right lower leg, Left lower leg(with reacher and LH sponge)   Body parts bathed by helper: Front perineal area, Buttocks, Left lower leg, Right lower leg     Bathing assist Assist Level: Moderate Assistance - Patient 50 - 74%     Upper Body Dressing/Undressing Upper body dressing   What is the patient wearing?: Hospital gown only    Upper body assist Assist Level: Minimal Assistance - Patient > 75%    Lower Body Dressing/Undressing Lower body dressing      What is the patient wearing?: Pants     Lower body assist Assist for lower body dressing: Total Assistance - Patient < 25%     Toileting Toileting    Toileting assist Assist for toileting: Moderate Assistance - Patient 50 - 74%     Transfers Chair/bed transfer  Transfers assist     Chair/bed transfer assist level: Moderate Assistance - Patient 50 - 74%     Locomotion Ambulation   Ambulation assist      Assist level: Contact Guard/Touching assist Assistive device: Walker-rolling Max distance: 1110f   Walk 10 feet activity   Assist     Assist level: Contact Guard/Touching assist Assistive device: Walker-rolling   Walk 50 feet activity   Assist Walk 50 feet with 2 turns activity did not occur: Safety/medical concerns  Assist level: Contact Guard/Touching assist Assistive device: Walker-rolling    Walk  150 feet activity   Assist Walk 150 feet activity did not occur: Safety/medical concerns         Walk 10 feet on uneven surface  activity   Assist     Assist level: Minimal Assistance - Patient > 75%(up/down ramp) Assistive device: Walker-rolling   Wheelchair     Assist Will patient use wheelchair at discharge?:  No(anticipate pt will not use w/c at discharge)             Wheelchair 50 feet with 2 turns activity    Assist            Wheelchair 150 feet activity     Assist          Medical Problem List and Plan: 1.Right side weakness and slurred speechsecondary to left MCA infarction status post IR left ICA near occlusive thrombus/revascularization as well as history of recent subdural hematoma January 2020.Marland Kitchen No anti-thrombotic for now given recent SDH and elevated INR per neurology services. CIR PT, OT SLP ,  2. Antithrombotics: -DVT/anticoagulation:Venous Dopplers negative SCDs -antiplatelet therapy: N/A 3. Pain Management:Tylenol as needed 4. Mood:Provide emotional support -antipsychotic agents: N/A 5. Neuropsych: This patientiscapable of making decisions on herown behalf. 6. Skin/Wound Care:Routine skin checks 7. Fluids/Electrolytes/Nutrition:Routine in the mouth with follow-up chemistries 8. Acute respiratory failure after mechanical thrombectomy. Extubated 03/15/2019 9. Uncompensated cirrhosis.  Chronulac.on hold monitor serum ammonia 10twice daily.Latest ammonia level normal 10. Hypertension.Lasix 40 mg daily, Aldactone 12.5 mg daily have been resumed.Monitor with increased mobility.. Vitals:   03/21/19 2033 03/22/19 0305  BP: 114/61 101/62  Pulse: 93 95  Resp: 15 16  Temp: 97.7 F (36.5 C) 98.5 F (36.9 C)  SpO2: 99% 98%  controlled 4/27 11. Diabetes mellitus. SSI. Hemoglobin A1c 6.2.NovoLog 5 units 3 times a day. Patient on Glucotrol 10 mg twice a day, Glucophage 500 mg twice a day, Victoza 1.8 mg daily at bedtime prior to admission. Resume as needed Elevated restart glucotrol, no metformin CBG (last 3)  Recent Labs    03/21/19 1632 03/21/19 2149 03/22/19 0617  GLUCAP 178* 197* 216*  12. Hyperlipidemia. No statin at this time due to cirrhosis 13. Klebsiella and enterococcus UTI. Continue Augmentin through  03/22/2019 and stop 14. CKD stage III. Has not been at baseline since hospitalization in February 2020,4/24 values in line with recent.  Has had increased diuretics to mobilize ascites fluid and avoid repeat paracentesis.   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  Abdominal distention in a patient with [name] and cirrhosis.  She has no nausea or vomiting.  She has had multiple stools. Improved off lactulose. KUB without ileus  Will need to monitor serum ammonia Consult GI ([place] sees GI [place] ) , ? Need for paracentesis  16.  Edema likely related to low alb  on diuretic ? Accuracy of today's reading given 4kg gain, pt does not feel like she has more fluid Filed Weights   03/19/19 0500 03/20/19 0514 03/22/19 0510  Weight: 112.4 kg 112.7 kg 116 kg  Would not increase diuretic due to renal fxn, may need nephro to assist on this if weights increase , no sign of ht failure LOS: 4 days A FACE TO FACE EVALUATION WAS PERFORMED  Monica Pearson 03/22/2019, 7:31 AM

## 2019-03-22 NOTE — Progress Notes (Signed)
Speech Language Pathology Daily Session Note  Patient Details  Name: Monica Pearson MRN: [id number] Date of Birth: July 17, 1946  Today's Date: 03/22/2019 SLP Individual Time: 0920-1015 SLP Individual Time Calculation (min): 55 min  Short Term Goals: Week 1: SLP Short Term Goal 1 (Week 1): Patient will recall new, daily information with supervision verbal and visual cues.  SLP Short Term Goal 2 (Week 1): Patient will demonstrate complex problem solving for functional tasks with supervision verbal cues.   Skilled Therapeutic Interventions: Skilled ST services focused on cognitive skills. Pt requested to use bathroom, SLP provided min A with RW, however it took more than a reasonable amount of time for pt to prepare herself to stand. SLP facilitated further assessment of cognitive linguistic skills utilizing Cognistat and subsections of CLQT. Pt demonstrated WFL on all subsections of Cognistat, with noting errors in sustained attention and immediate recall, suggest exacerbated by PPE. Pt demonstrated impairments below functional limites in exective function/attention (symbol cancellation and symbol trial) and required repetition of instructions x2 during tasks. SLP will continue formal assessment in upcoming sessions and adjust goal according. Pt was left in room with call bell within reach and bed alarm set. ST recommends to continue skilled ST services.      Pain Pain Assessment Pain Score: 0-No pain  Therapy/Group: Individual Therapy  Maebell Lyvers  [facility] 03/22/2019, 12:54 PM

## 2019-03-22 NOTE — Progress Notes (Signed)
Occupational Therapy Session Note  Patient Details  Name: Monica Pearson MRN: [id number] Date of Birth: 1946/04/24  Today's Date: 03/22/2019 OT Individual Time: 0312-8118 OT Individual Time Calculation (min): 45 min    Short Term Goals: Week 1:  OT Short Term Goal 1 (Week 1): Pt will demonstrate improved R hand coordination to be able use her utensils to cut a piece of food.  OT Short Term Goal 2 (Week 1): Pt will use AE to don pant with min A.   OT Short Term Goal 3 (Week 1): pt will use AE to don socks with set up. OT Short Term Goal 4 (Week 1): Pt will sit to stand to RW with S. OT Short Term Goal 5 (Week 1): Pt will complete 2/3 steps of toileting with S.  Skilled Therapeutic Interventions/Progress Updates:    Pt seen this session for ADL training with a focus on mobility skills. She continues to have abdominal swelling but her LE are less swollen.  Pt sat to EOB with CGA then stood to RW from elevated bed with S.  CGA to S ambulating to toilet.  Toileted with mod A for cleansing, pt able to manage clothing. Transferred to shower. Covered PICC line.  Used long sponge for feet but was not able to adequately wash front perineal area in sit or stand.  She will need to lay down and get A with that area.  Did rinse that area well with water.  Sit to stand from bench 3x using bars with S.  Returned to toilet seat to dress.  Mod A with bra, s/u shirt, mod A LB due to limited time.  Did not don TEDs as pt was going back to bed to rest.  Needed mod A to lift legs into bed.  Pt resting in bed with all needs met.    Therapy Documentation Precautions:  Precautions Precautions: Fall, Other (comment) Precaution Comments: weeping skin Restrictions Weight Bearing Restrictions: No   Pain: Pain Assessment Pain Scale: 0-10 Pain Score: 0-No pain    Therapy/Group: Individual Therapy  [place] 03/22/2019, 9:14 AM

## 2019-03-22 NOTE — Progress Notes (Signed)
Spoke with Dr. Letta Pate this morning and lactulose is on hold at this time.

## 2019-03-22 NOTE — Progress Notes (Signed)
Physical Therapy Session Note  Patient Details  Name: Monica Pearson MRN: [id number] Date of Birth: [birth year]  Today's Date: 03/22/2019 PT Individual Time: 7741-4239 PT Individual Time Calculation (min): 74 min   Short Term Goals: Week 1:  PT Short Term Goal 1 (Week 1): Pt will performed supine<>sit with min assist PT Short Term Goal 2 (Week 1): Pt will ambulate at least 99f using LRAD with min assist PT Short Term Goal 3 (Week 1): Pt will ascend/descend 4 steps with min assist PT Short Term Goal 4 (Week 1): Pt will perform bed<>chair transfers with CGA  Skilled Therapeutic Interventions/Progress Updates:    Patient supine in her room on the phone.  Agreeable to PT.  Performed supine to sit with mod A elevated HOB.  Sit to stand with RW and min A.  Stand pivot to w/c min A cues for walker proximity.  Patient assisted in w/c to dayroom and performed ambulated 110' with RW and min A to CGA c/o bowels needing to move.  To bathroom and toileted with max A for clothing management and hygiene.  Patient ambulated 53 with RW and SpO2 monitored staying 97% or above HR 114.  Patient reports at times feels vision dims when walking.  Feels may be due to cataracts.  Patient ambulated 832 x 2 with RW and CGA to S.  Seated on Nu Step for LE/UE at level 2 x 5 minutes.  Obtained taller walker for pt and reported feeling better with more upright posture.  Patient ambulated to w/c with RW and close S.  Assisted in w/c to room.  Transferred to bed with RW and CGA.  Sit to supine with mod A for LE's and mod cues for positioning.  Left in supine with call bell and needs in reach with bed alarm activated.    Therapy Documentation Precautions:  Precautions Precautions: Fall, Other (comment) Precaution Comments: weeping skin Restrictions Weight Bearing Restrictions: No Pain: Pain Assessment Pain Score: 0-No pain    Therapy/Group: Individual Therapy  CReginia Naas [place] PT 03/22/2019, 2:57 PM

## 2019-03-23 ENCOUNTER — Inpatient Hospital Stay (HOSPITAL_COMMUNITY): Payer: Medicare Other | Admitting: Physical Therapy

## 2019-03-23 ENCOUNTER — Inpatient Hospital Stay (HOSPITAL_COMMUNITY): Payer: Self-pay | Admitting: Physical Therapy

## 2019-03-23 ENCOUNTER — Inpatient Hospital Stay (HOSPITAL_COMMUNITY): Payer: Medicare Other

## 2019-03-23 ENCOUNTER — Encounter (HOSPITAL_COMMUNITY): Payer: Medicare Other | Admitting: Psychology

## 2019-03-23 ENCOUNTER — Inpatient Hospital Stay (HOSPITAL_COMMUNITY): Payer: Medicare Other | Admitting: Occupational Therapy

## 2019-03-23 DIAGNOSIS — K7290 Hepatic failure, unspecified without coma: Secondary | ICD-10-CM

## 2019-03-23 DIAGNOSIS — M7989 Other specified soft tissue disorders: Secondary | ICD-10-CM

## 2019-03-23 DIAGNOSIS — N179 Acute kidney failure, unspecified: Secondary | ICD-10-CM

## 2019-03-23 DIAGNOSIS — R609 Edema, unspecified: Secondary | ICD-10-CM

## 2019-03-23 DIAGNOSIS — R188 Other ascites: Secondary | ICD-10-CM

## 2019-03-23 LAB — HEPATIC FUNCTION PANEL
ALT: 62 U/L — ABNORMAL HIGH (ref 0–44)
AST: 70 U/L — ABNORMAL HIGH (ref 15–41)
Albumin: 2.3 g/dL — ABNORMAL LOW (ref 3.5–5.0)
Alkaline Phosphatase: 232 U/L — ABNORMAL HIGH (ref 38–126)
Bilirubin, Direct: 1.1 mg/dL — ABNORMAL HIGH (ref 0.0–0.2)
Indirect Bilirubin: 1.9 mg/dL — ABNORMAL HIGH (ref 0.3–0.9)
Total Bilirubin: 3 mg/dL — ABNORMAL HIGH (ref 0.3–1.2)
Total Protein: 5.7 g/dL — ABNORMAL LOW (ref 6.5–8.1)

## 2019-03-23 LAB — BASIC METABOLIC PANEL
Anion gap: 9 (ref 5–15)
BUN: 78 mg/dL — ABNORMAL HIGH (ref 8–23)
CO2: 20 mmol/L — ABNORMAL LOW (ref 22–32)
Calcium: 8.2 mg/dL — ABNORMAL LOW (ref 8.9–10.3)
Chloride: 104 mmol/L (ref 98–111)
Creatinine, Ser: 2.56 mg/dL — ABNORMAL HIGH (ref 0.44–1.00)
GFR calc Af Amer: 21 mL/min — ABNORMAL LOW (ref >60)
GFR calc non Af Amer: 18 mL/min — ABNORMAL LOW (ref >60)
Glucose, Bld: 189 mg/dL — ABNORMAL HIGH (ref 70–99)
Potassium: 3.2 mmol/L — ABNORMAL LOW (ref 3.5–5.1)
Sodium: 133 mmol/L — ABNORMAL LOW (ref 135–145)

## 2019-03-23 LAB — PROTIME-INR
INR: 1.6 — ABNORMAL HIGH (ref 0.8–1.2)
Prothrombin Time: 19.2 seconds — ABNORMAL HIGH (ref 11.4–15.2)

## 2019-03-23 LAB — GLUCOSE, CAPILLARY
Glucose-Capillary: 160 mg/dL — ABNORMAL HIGH (ref 70–99)
Glucose-Capillary: 190 mg/dL — ABNORMAL HIGH (ref 70–99)
Glucose-Capillary: 152 mg/dL — ABNORMAL HIGH (ref 70–99)
Glucose-Capillary: 176 mg/dL — ABNORMAL HIGH (ref 70–99)

## 2019-03-23 LAB — AMMONIA: Ammonia: 44 umol/L — ABNORMAL HIGH (ref 9–35)

## 2019-03-23 MED ORDER — GLIPIZIDE 2.5 MG HALF TABLET
2.5000 mg | ORAL_TABLET | Freq: Every day | ORAL | Status: DC
  Administered 2019-03-23 – 2019-03-24 (×2): 2.5 mg via ORAL
  Filled 2019-03-23 (×3): qty 1

## 2019-03-23 MED ORDER — ALBUMIN HUMAN 5 % IV SOLN
25.0000 g | Freq: Four times a day (QID) | INTRAVENOUS | Status: DC
  Administered 2019-03-23 – 2019-03-24 (×4): 12.5 g via INTRAVENOUS
  Filled 2019-03-23 (×8): qty 500

## 2019-03-23 NOTE — Plan of Care (Signed)
  Problem: Consults Goal: RH STROKE PATIENT EDUCATION Description See Patient Education module for education specifics mod I  Outcome: Progressing Goal: Nutrition Consult-if indicated Description Mod I  Outcome: Progressing Goal: Diabetes Guidelines if Diabetic/Glucose > 140 Description If diabetic or lab glucose is > 140 mg/dl - Initiate Diabetes/Hyperglycemia Guidelines & Document Interventions Mod I  Outcome: Progressing   Problem: RH BOWEL ELIMINATION Goal: RH STG MANAGE BOWEL W/MEDICATION W/ASSISTANCE Description STG Manage Bowel with Medication with Mod  Assistance.  Outcome: Progressing Flowsheets (Taken 03/23/2019 1322) STG: Pt will manage bowels with medication with assistance: 4-Minimal assistance   Problem: RH BLADDER ELIMINATION Goal: RH STG MANAGE BLADDER WITH ASSISTANCE Description STG Manage Bladder With modvAssistance  Outcome: Progressing Flowsheets (Taken 03/23/2019 1322) STG: Pt will manage bladder with assistance: 5-Supervision/cueing   Problem: RH SKIN INTEGRITY Goal: RH STG SKIN FREE OF INFECTION/BREAKDOWN Description Skin free from infection entire stay on rehab  Outcome: Progressing   Problem: RH SAFETY Goal: RH STG ADHERE TO SAFETY PRECAUTIONS W/ASSISTANCE/DEVICE Description STG Adhere to Safety Precautions With  Mod Assistance/Device.  Outcome: Progressing Flowsheets (Taken 03/23/2019 1322) STG:Pt will adhere to safety precautions with assistance/device: 5-Supervision/cueing Goal: RH STG DECREASED RISK OF FALL WITH ASSISTANCE Description STG Decreased Risk of Fall With mod  Assistance.  Outcome: Progressing Flowsheets (Taken 03/23/2019 1322) [id number] risk of fall  with assistance/device: 5-Supervision/cueing   Problem: RH PAIN MANAGEMENT Goal: RH STG PAIN MANAGED AT OR BELOW PT'S PAIN GOAL Description Pain less than 2  Outcome: Progressing

## 2019-03-23 NOTE — Progress Notes (Signed)
Occupational Therapy Session Note  Patient Details  Name: Monica Pearson MRN: [id number] Date of Birth: 1946-01-26  Today's Date: 03/23/2019 OT Individual Time: 3818-4037 OT Individual Time Calculation (min): 54 min    Short Term Goals: Week 1:  OT Short Term Goal 1 (Week 1): Pt will demonstrate improved R hand coordination to be able use her utensils to cut a piece of food.  OT Short Term Goal 2 (Week 1): Pt will use AE to don pant with min A.   OT Short Term Goal 3 (Week 1): pt will use AE to don socks with set up. OT Short Term Goal 4 (Week 1): Pt will sit to stand to RW with S. OT Short Term Goal 5 (Week 1): Pt will complete 2/3 steps of toileting with S.  Skilled Therapeutic Interventions/Progress Updates:    Pt completed bathing, toileting, and grooming tasks during session.  Mod assist for sit to stand from the bedside recliner and from the toilet.  Min guard for functional mobility once she was standing, with use of the RW for support.  Max assist for completion of toilet hygiene and clothing management.  Min assist for removal of soiled brief and for donning of new one with use of the reacher.  She needed max assist for pulling brief over hips once standing.  She completed grooming tasks in sitting at the sink with setup as well as UB bathing.  New gown was donned and pt was left sitting in the wheelchair with call button and phone in reach.   Therapy Documentation Precautions:  Precautions Precautions: Fall, Other (comment) Precaution Comments: weeping skin Restrictions Weight Bearing Restrictions: No  Pain: Pain Assessment Pain Scale: Faces Pain Score: 0-No pain ADL: See Care Tool Section for some details of ADL  Therapy/Group: Individual Therapy  Reagen Haberman OTR/L 03/23/2019, 12:54 PM

## 2019-03-23 NOTE — Progress Notes (Signed)
Speech Language Pathology Daily Session Note  Patient Details  Name: Monica Pearson MRN: [id number] Date of Birth: 30-Nov-1945  Today's Date: 03/23/2019 SLP Individual Time: 7829-5621 SLP Individual Time Calculation (min): 60 min  Short Term Goals: Week 1: SLP Short Term Goal 1 (Week 1): Patient will recall new, daily information with supervision verbal and visual cues.  SLP Short Term Goal 2 (Week 1): Patient will demonstrate complex problem solving for functional tasks with supervision verbal cues.   Skilled Therapeutic Interventions:Skilled ST services focused on cognitive skills. SLP facilitated completion of formal cognitive linguistic assessment CLQT, pt scored, WFL on language skills, mild impairment memory, executive function and visual spatial skills, and moderate impairment in attention. SLP suggests attention deficits due to memory impairment in encoding/strogaes compared to attention due to pt's demonstration of selective and alternating attention and requiring repetition of instructions during attention subsections. SLP facilitated complex problem solving skills utilizing two scheduling task, pt required supervision A verbal cues fade to mod I (3/4th way through task) in structured task (outline of times and bolded information), however is less structured tasks ( information in large paragraph) pt required mod A verbal cues for organization and note taking strategies to aid in recall. Pt was left in room with call bell within reach and chair alarm set. ST recommends to continue skilled ST services.      Pain Pain Assessment Pain Scale: 0-10 Pain Score: 0-No pain  Therapy/Group: Individual Therapy  Bailei Buist  [facility] 03/23/2019, 12:16 PM

## 2019-03-23 NOTE — Progress Notes (Signed)
Physical Therapy Session Note  Patient Details  Name: Monica Pearson MRN: [id number] Date of Birth: 06-03-1946  Today's Date: 03/23/2019 PT Individual Time: 4196-2229 and 7989-2119 PT Individual Time Calculation (min): 47 min and 81 min  Short Term Goals: Week 1:  PT Short Term Goal 1 (Week 1): Pt will performed supine<>sit with min assist PT Short Term Goal 2 (Week 1): Pt will ambulate at least 6f using LRAD with min assist PT Short Term Goal 3 (Week 1): Pt will ascend/descend 4 steps with min assist PT Short Term Goal 4 (Week 1): Pt will perform bed<>chair transfers with CGA  Skilled Therapeutic Interventions/Progress Updates:   Session 1: Pt received supine in bed with nursing staff present and pt agreeable to therapy session. Therapist donned TED hose and socks total assist. Pt performed supine to sit, HOB elevated and using bed rails, with mod assist for trunk upright and assisting B LEs to EOB. Pt reporting needing to use bathroom. Performed sit to stand with min assist for lifting. Pt ambulated ~115fusing RW with CGA to bathroom and performed stand<>sit on BSC over toilet with CGA for steadying. Pt had minimal bowel incontinence in brief followed by bowel continence on toilet. Therapist performed peri-care total assist for cleanliness - noted blood when wiping rectum - RN notified. Pt ambulated ~8526fsing RW with CGA for steadying focusing on activity tolerance - pt continues to ambulate with significantly decreased gait speed. Pt left sitting in recliner with needs in reach and seat belt alarm on.  Session 2: Pt received supine in bed with GI MDs present, assessing pt and quickly exiting upon therapist arrival. Pt agreeable to therapy session. Performed supine to sit, HOB partially elevated and using bedrails, with min/mod assist for B LE management and trunk upright. Pt performed sit<>stand from EOB/EOM/w/c to RW with CGA throughout session. Pt transported in w/c to therapy gym. Pt ambulated  [id number] RW  requiring 4x standing rest breaks for <20 seconds each with CGA throughout for steadying- pt demonstrates wheezing while breathing with SpO2 measured to be 97% and HR of 98bpm after ambulation. Pt performed alternate B LE foot taps on 4" step using B UE support on RW and CGA for steadying - pt unable to lift R LE high enough to tap foot on step therefore transitioned to 2" step for increased foot clearance and increased pt success with task - pt able to lift R LE fully onto 2" step performing 2 sets of 10 with seated rest break between. Pt reporting she needs to have BM. Performed stand pivot transfer EOM to w/c using RW with CGA for steadying. Pt transported back to room. Ambulated ~10ft53f<>toilet using RW with CGA for steadying and performed sit<>stand transfer toilet<>RW with min assist for lifting/lowering. Pt continent of bowels and required total assist for LB clothing management and peri-care. Pt transported in w/c to therapy gym and performed stand pivot transfer w/c<>Nustep using RW with CGA for steadying and mod assist for B LE foot placement on pedals. Pt performed Nustep focusing on reciprocal B LE movements for LE strengthening and activity tolerance at level 5 for 6 minutes totaling 323 steps with cuing throughout to sustain steps per minute >50. Pt transported back to room in w/c and left sitting in w/c with needs in reach and seat belt alarm on.   Therapy Documentation Precautions:  Precautions Precautions: Fall, Other (comment) Precaution Comments: weeping skin Restrictions Weight Bearing Restrictions: No Pain:   Session 1: Pt reports  no pain during session  Session 2: Pt reports she has had no pain during session.    Therapy/Group: Individual Therapy  Tawana Scale, PT, DPT 03/23/2019, 7:44 AM

## 2019-03-23 NOTE — Consult Note (Signed)
  Brief Note:  For the first 30 scheduled for visit patient was in bathroom with nurse.  Was able to briefly go in and see patient.  She reports that she has been experiencing increases in anxiety and depressive symptoms with the most acute worry being that she has just found out that she has Stage III CKD.  She has been dealing with liver disease for some time but reports that she was never told about kidney issues before.  She is still distended and reports that extra fluid is causing discomfort.  Worked on coping with anxiety.  Will see the patient again to follow-up for longer visit.

## 2019-03-23 NOTE — Progress Notes (Signed)
On arrival patient is on her Home Unit CPAP. No distress or complications noted.

## 2019-03-23 NOTE — Progress Notes (Signed)
Lab drawn AFP marker sunquest down

## 2019-03-23 NOTE — Progress Notes (Signed)
Right upper extremity venous duplex has been completed. Preliminary results can be found in CV Proc through chart review.  Results were given to the patient's nurse.  03/23/19 3:45 PM Carlos Levering RVT

## 2019-03-23 NOTE — Progress Notes (Addendum)
[facility] PHYSICAL MEDICINE & REHABILITATION PROGRESS NOTE   Subjective/Complaints:  Pt frustrated, "I keep on swelling", poor UO despite diuretics and BPs soft  Pt denies dizziness  RUE swollen but not painful has PICC on that side  ROS Neg CP, SOB, N/V/D   Objective:   No results found. No results for input(s): WBC, HGB, HCT, PLT in the last 72 hours. No results for input(s): NA, K, CL, CO2, GLUCOSE, BUN, CREATININE, CALCIUM in the last 72 hours.  Intake/Output Summary (Last 24 hours) at 03/23/2019 0730 Last data filed at 03/23/2019 0320 Gross per 24 hour  Intake 900 ml  Output 450 ml  Net 450 ml     Physical Exam: Vital Signs Blood pressure (!) 99/56, pulse 89, temperature 97.8 F (36.6 C), temperature source Oral, resp. rate 18, height 5' 3"  (1.6 m), weight 116.7 kg, SpO2 100 %.   General: No acute distress Mood and affect are appropriate Heart: Regular rate and rhythm no rubs murmurs or extra sounds Lungs: Clear to auscultation, breathing unlabored, no rales or wheezes Abdomen: Positive bowel sounds, soft nontender to palpation, nondistended Extremities: No clubbing, cyanosis,, 1+ edema bilateral pretibial  Skin: No evidence of breakdown, no evidence of rash Neurologic: Cranial nerves II through XII intact, motor strength is 4/5 in bilateral deltoid, bicep, tricep, grip, hip flexor, knee extensors, ankle dorsiflexor and plantar flexor Sensory exam normal sensation to light touch and proprioception in bilateral upper and lower extremities Cerebellar exam normal finger to nose to finger as well as heel to shin in bilateral upper and lower extremities Musculoskeletal: Full range of motion in all 4 extremities. No joint swelling   Assessment/Plan: 1. Functional deficits secondary to MCA infarct and mobility which require 3+ hours per day of interdisciplinary therapy in a comprehensive inpatient rehab setting.  Physiatrist is providing close team supervision and 24  hour management of active medical problems listed below.  Physiatrist and rehab team continue to assess barriers to discharge/monitor patient progress toward functional and medical goals  Care Tool:  Bathing    Body parts bathed by patient: Right arm, Left arm, Chest, Abdomen, Right upper leg, Left upper leg, Face, Buttocks, Right lower leg, Left lower leg   Body parts bathed by helper: Front perineal area     Bathing assist Assist Level: Minimal Assistance - Patient > 75%     Upper Body Dressing/Undressing Upper body dressing   What is the patient wearing?: Bra, Pull over shirt    Upper body assist Assist Level: Minimal Assistance - Patient > 75%    Lower Body Dressing/Undressing Lower body dressing      What is the patient wearing?: Underwear/pull up, Pants     Lower body assist Assist for lower body dressing: Moderate Assistance - Patient 50 - 74%     Toileting Toileting    Toileting assist Assist for toileting: Moderate Assistance - Patient 50 - 74%     Transfers Chair/bed transfer  Transfers assist     Chair/bed transfer assist level: Moderate Assistance - Patient 50 - 74%     Locomotion Ambulation   Ambulation assist      Assist level: Contact Guard/Touching assist Assistive device: Walker-rolling Max distance: 110'   Walk 10 feet activity   Assist     Assist level: Contact Guard/Touching assist Assistive device: Walker-rolling   Walk 50 feet activity   Assist Walk 50 feet with 2 turns activity did not occur: Safety/medical concerns  Assist level: Contact Guard/Touching assist Assistive  device: Walker-rolling    Walk 150 feet activity   Assist Walk 150 feet activity did not occur: Safety/medical concerns         Walk 10 feet on uneven surface  activity   Assist     Assist level: Minimal Assistance - Patient > 75%(up/down ramp) Assistive device: Aeronautical engineer Will patient use wheelchair  at discharge?: No(anticipate pt will not use w/c at discharge)             Wheelchair 50 feet with 2 turns activity    Assist            Wheelchair 150 feet activity     Assist          Medical Problem List and Plan: 1.Right side weakness and slurred speechsecondary to left MCA infarction status post IR left ICA near occlusive thrombus/revascularization as well as history of recent subdural hematoma January 2020.Marland Kitchen No anti-thrombotic for now given recent SDH and elevated INR per neurology services. CIR PT, OT SLP ,  2. Antithrombotics: -DVT/anticoagulation:Venous Dopplers negative SCDs, CHeck UE doppler may have PICC related thrombus , No longer requiring PICC unless renal is planning on IV diuretics -antiplatelet therapy: N/A 3. Pain Management:Tylenol as needed 4. Mood:Provide emotional support -antipsychotic agents: N/A 5. Neuropsych: This patientiscapable of making decisions on herown behalf. 6. Skin/Wound Care:Routine skin checks 7. Fluids/Electrolytes/Nutrition:Routine in the mouth with follow-up chemistries 8. Acute respiratory failure after mechanical thrombectomy. Extubated 03/15/2019 9. Uncompensated cirrhosis.  Chronulac.on hold monitor serum ammonia mildly elevated but stable vs values while on lactulose 10twice daily.Latest ammonia level normal 10. Hypertension.Lasix 40 mg daily, Aldactone 12.5 mg daily have been resumed.Monitor with increased mobility.. Vitals:   03/22/19 2005 03/23/19 0333  BP: 116/68 (!) 99/56  Pulse: 100 89  Resp: 15 18  Temp: 97.9 F (36.6 C) 97.8 F (36.6 C)  SpO2: 100% 100%  controlled 4/28-check orthostatics 11. Diabetes mellitus. SSI. Hemoglobin A1c 6.2.NovoLog 5 units 3 times a day. Patient on Glucotrol 10 mg twice a day, Glucophage 500 mg twice a day, Victoza 1.8 mg daily at bedtime prior to admission. Resume as needed Elevated restart glucotrol 2.47m qam , no metformin CBG  (last 3)  Recent Labs    03/22/19 1659 03/22/19 2125 03/23/19 0645  GLUCAP 203* 236* 160*  12. Hyperlipidemia. No statin at this time due to cirrhosis 13. Klebsiella and enterococcus UTI. Continue Augmentin through 03/22/2019 and stop 14. CKD stage III. Has not been at baseline since hospitalization in February 2020,4/24 values in line with recent.  Has had increased diuretics to mobilize ascites fluid and avoid repeat paracentesis.   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  Abdominal distention in a patient with NKarlene Linemanand cirrhosis.  She has no nausea or vomiting.  She has had multiple stools. Improved off lactulose. KUB without ileus  Will need to monitor serum ammonia Consult GI - GI PA reviewed chart and did not rec paracentesis  16.  Edema likely related to low alb  on diuretic ? Accuracy of today's reading given 4kg gain, pt does not feel like she has more fluid Filed Weights   03/20/19 0514 03/22/19 0510 03/23/19 0333  Weight: 112.7 kg 116 kg 116.7 kg  Would not increase diuretic due to renal fxn,  need nephro to assist on this , no sign of ht failure, EJ fx normal, D/w Dr SJoelyn Omsrepeat BMET today LOS: 5 days  A FACE TO FACE EVALUATION WAS PERFORMED  Charlett Blake 03/23/2019, 7:30 AM

## 2019-03-23 NOTE — Consult Note (Signed)
Monica Pearson Admit Date: 03/18/2019 03/23/2019 Monica Pearson Requesting Physician:  Letta Pate MD  Reason for Consult:  AoCKD3, Hypervolemia, diuretic resistance HPI:  53F initially presented 4/19 with aculte L MCA CVA s/p IR angiography and embolization; rec CT with contrast and angiogram at that time. Transiently with VDRF, extubated 4/20.  Transferred to CIR 4/23.    Pt has NAFLD cirrhosis follows with [facility].  Known complication appears to be ascites managed with lasix/spironolactone.    Diuretics have been continued during time at CIR but weights have increased 10kg and probably more than that over past several weeks.  Pt thinks up 30 or 40lb.  She has tremendous abd distension. She has generalized weeping from diffuse anasarca of trunk and legs.    SCr has increased to 2.56 from presenting 1.8 and appears baseline is less thatn 1.8.  TBilie increased, now 3.1, INR 1.6.  PLT low.  Alb last check was 2.2.    On augmentin for Klebsiella and enterococcal UTI from 4/20 UCx  PMH also Incudes:  Hx/o SDH 11/2018  DM2  HLD  Prev T user  Pt is Jehovah's Witness   Creatinine, Ser (mg/dL)  Date Value  03/23/2019 2.56 (H)  03/19/2019 1.97 (H)  03/18/2019 1.95 (H)  03/17/2019 1.98 (H)  03/16/2019 1.76 (H)  03/15/2019 1.72 (H)  03/14/2019 1.70 (H)  03/14/2019 1.78 (H)  03/02/2019 1.34 (H)  01/16/2019 1.19 (H)  ] I/Os: I/O last 3 completed shifts: In: 45 [P.O.:880; I.V.:20] Out: 800 [Urine:800]   ROS NSAIDS: no exposure identified IV Contrast sig contrast exposure 03/14/19, no sig change in SCr until 4/28 TMP/SMX no exposure Hypotension not present; soft B Ps generally Balance of 12 systems is negative w/ exceptions as above  PMH  Past Medical History:  Diagnosis Date  . Cirrhosis ([place])   . Diabetes mellitus without complication ([place])   . Diabetic retinopathy ([place])   . Diabetic retinopathy ([place])   . GERD (gastroesophageal reflux disease)   . High cholesterol    . Hypertension   . Patient is Jehovah's Witness   . Sarcoidosis   . Stroke ([place])   . Subdural hematoma ([facility])    PSH  Past Surgical History:  Procedure Laterality Date  . CHOLECYSTECTOMY    . IR CT HEAD LTD  03/14/2019  . IR PERCUTANEOUS ART THROMBECTOMY/INFUSION INTRACRANIAL INC DIAG ANGIO  03/14/2019  . KNEE SURGERY    . RADIOLOGY WITH ANESTHESIA N/A 03/14/2019   Procedure: RADIOLOGY WITH ANESTHESIA;  Surgeon: Luanne Bras, MD;  Location: [employer];  Service: Radiology;  Laterality: N/A;   FH  Family History  Problem Relation Age of Onset  . Stroke Mother   . Bladder Cancer Mother   . CAD Father   . Prostate cancer Father    SH  reports that she has quit smoking. She has never used smokeless tobacco. She reports that she does not drink alcohol or use drugs. Allergies  Allergies  Allergen Reactions  . Celecoxib Swelling    Ankles swell  . Rofecoxib Swelling    Ankles swell  . Ciprofloxacin Itching  . Darvon [Propoxyphene] Nausea And Vomiting  . Hydrocodone-Acetaminophen Nausea And Vomiting  . Morphine Nausea And Vomiting   Home medications Prior to Admission medications   Medication Sig Start Date End Date Taking? Authorizing Provider  amoxicillin-clavulanate (AUGMENTIN) 500-125 MG tablet Take 1 tablet (500 mg total) by mouth 2 (two) times daily. 03/18/19   Donzetta Starch, NP  co-enzyme Q-10 30 MG  capsule Take 30 mg by mouth 2 (two) times daily.    [provider]  furosemide (LASIX) 40 MG tablet Take 1 tablet (40 mg total) by mouth daily. 03/19/19   Donzetta Starch, NP  insulin aspart (NOVOLOG) 100 UNIT/ML injection Inject 0-15 Units into the skin 4 (four) times daily -  with meals and at bedtime. 03/18/19   Donzetta Starch, NP  insulin aspart (NOVOLOG) 100 UNIT/ML injection Inject 5 Units into the skin 3 (three) times daily with meals. 03/18/19   Donzetta Starch, NP  lactulose (CHRONULAC) 10 GM/15ML solution Take 15 mLs (10 g total) by mouth 2 (two) times  daily. Patient taking differently: Take 10 g by mouth 2 (two) times daily as needed for mild constipation.  01/16/19   Florencia Reasons, MD  magnesium oxide (MAG-OX) 400 (241.3 Mg) MG tablet Take 1 tablet (400 mg total) by mouth at bedtime. 03/18/19   Donzetta Starch, NP  pantoprazole (PROTONIX) 40 MG tablet Take 1 tablet (40 mg total) by mouth daily at 12 noon. 03/19/19   Donzetta Starch, NP  senna-docusate (SENOKOT-S) 8.6-50 MG tablet Take 1 tablet by mouth at bedtime as needed for mild constipation. 03/18/19   Donzetta Starch, NP  sodium chloride 0.9 % infusion Inject 40 mLs into the vein as needed (for administration of IV medications (carrier fluid)). 03/18/19   Donzetta Starch, NP  spironolactone (ALDACTONE) 25 MG tablet Take 0.5 tablets (12.5 mg total) by mouth daily. 03/19/19   Donzetta Starch, NP    Current Medications Scheduled Meds: . furosemide  40 mg Oral Daily  . glipiZIDE  2.5 mg Oral QAC breakfast  . insulin aspart  0-15 Units Subcutaneous TID WC & HS  . insulin aspart  5 Units Subcutaneous TID WC  . magnesium oxide  400 mg Oral QHS  . pantoprazole  40 mg Oral Q1200  . simethicone  80 mg Oral QID  . sodium chloride flush  10-40 mL Intracatheter Q12H  . spironolactone  12.5 mg Oral Daily   Continuous Infusions: PRN Meds:.acetaminophen **OR** acetaminophen (TYLENOL) oral liquid 160 mg/5 mL **OR** acetaminophen, sodium chloride  CBC Recent Labs  Lab 03/17/19 1117 03/17/19 1627 03/18/19 0400 03/19/19 0610  WBC 7.1  --  7.6 6.7  NEUTROABS  --   --   --  4.0  HGB 7.2* 8.8* 7.7* 7.9*  HCT 19.8* 22.4* RESULTS UNAVAILABLE DUE TO INTERFERING SUBSTANCE RESULTS UNAVAILABLE DUE TO INTERFERING SUBSTANCE  MCV 71.7*  --  RESULTS UNAVAILABLE DUE TO INTERFERING SUBSTANCE RESULTS UNAVAILABLE DUE TO INTERFERING SUBSTANCE  PLT 72*  --  70* 64*   Basic Metabolic Panel Recent Labs  Lab 03/17/19 0505 03/18/19 0400 03/19/19 0610 03/23/19 0937  NA 140 138 139 133*  K 3.6 3.5 3.5 3.2*  CL 104 106  107 104  CO2 20* 22 22 20*  GLUCOSE 167* 160* 108* 189*  BUN 69* 63* 67* 78*  CREATININE 1.98* 1.95* 1.97* 2.56*  CALCIUM 8.7* 8.1* 8.3* 8.2*    Physical Exam  Blood pressure (!) 99/56, pulse 89, temperature 97.8 F (36.6 C), temperature source Oral, resp. rate 18, height 5' 3"  (1.6 m), weight 116.7 kg, SpO2 100 %. GEN: Chronically ill appaering, in wheelchair ENT: Temporal wasting EYES: Scleral icterus present CV: Regular, nl s1s2 PULM: diminished throughout, esp in bases ABD: severely distended, soft, nt SKIN: diffuse anasarca of abd and legs, spontaneious weeping EXT:4+ weeping LEE into trunk  Assessment 5F NAFLD cirrhosis and  recent L MCA ischemic CVA s/p revascularization in CIR with AKI, diffuse anasarca.  1. AKI, oliguric, poor response to diuretics; Most likely ddx would be HRS (triggered by CVA and contrast exposure -- UOP < 500, UA 4/19 w/o proteinuria or hematuria -- did have pyuria and + UCx) and/or delayed contrast nephropathy.  Very worrisome.  2. Hypervolemia/Anasarca 3. NAFLD cirrhosis, decompensated; follows [facility] with [facility] 4. 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 Dm2 6. Klebsiella and enterococcal UTI on Augmention  Plan 1. Stop all diuretics 2. Abd [place] with liver doppler, need to exclude obstruction as well 3. U Na, U Cr, repeat UA, UP/C 4. Albumin 100gm today and tomorrow 5. 2gm sodium restriction, 1L fluid restriction 6. Recommend GI involvement as well 7. Do not perform LVP at this time 8. Daily weights, Daily Renal Panel, Strict I/Os, Avoid nephrotoxins (NSAIDs, judicious IV Contrast)    Pearson Grippe MD 03/23/2019, 2:55 PM

## 2019-03-23 NOTE — Consult Note (Addendum)
Referring Provider: Pearson Grippe, MD Primary Care Physician:  Jenel Lucks, PA-C Primary Gastroenterologist: Gracelyn Nurse, MD     Reason for Consultation:    Decompensated cirrhosis    ASSESSMENT / PLAN:    52.  73 year old patient with decompensated Monica Pearson cirrhosis in setting of recent CVA s/p revascularization of left ICA / acute respiratory failure/ UTI. She is not encephalopathic on exam, no tense ascites and INR stable at 1.6. These are are all good parameters but unfortunately she has AKI / CKD and is oliguric with poor response to diuretics. MELD not calculated, necessary labs not available. -update labs.   -Getting IV albumin for ? HRS -obtain diagnostic paracentesis to rule out SBP (200cc max). Will obtain fluid studies to include: Cell count, culture, cytology, total protein, albumin.  Not unable to perform LVP at this time due to renal function.  Does not seem to have a significant amount of ascites on exam anyway. -AFP. Due for [facility] screening per GI's last office note in March -agree with [place] with liver doppler studies ( Renal already ordered) to rule out obstructive process but also evaluate masslike area in upper abdomen -She is already on 2 g sodium diet.  2. Abnormal abdominal exam.  Does not appear to have significant amount of ascites.  But there is a large area of firmness in the mid upper abdomen ( stool filled colon, mass? )  3. Jehovah's Witness   HPI:     Monica Pearson is a 73 y.o. female with multiple medical problems not limited to cirrhosis, sarcoidosis, DM 2, hypertension, history of subdural hematoma and recent CVA.  Cirrhosis managed closely by Dr. Liane Comber with [facility].  Reviewed records in care everywhere.  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  Patient was seen by her gastroenterologist mid-March with complaints of peripheral edema /abdominal  distention.  She had in fact gained several pounds since being seen 3 weeks prior .  Therefore her Lasix was increased from 40 mg daily to twice daily and her Aldactone was increased to 50 mg twice daily.  There was some suggestion of intermittent encephalopathy at home.  Despite taking lactulose at home.  Because of history of IBS patient was not very tolerant of lactulose anyway.  She was started/restarted on Xifaxan 550 mg twice daily.  She had a therapeutic paracentesis done on 02/15/2019 with removal of 2 L.  Notes in care everywhere patient's last EGD was December 2018 with findings of portal hypertension, these was not noted.  Patient was admitted to [facility] 03/14/2019 for evaluation of slurred speech and right-sided weakness.  Found to have left MCA infarct.  On 03/14/2019 she underwent revascularization of symptomatic's severe stenosis left ICA per IR .  Following the procedure she developed acute respiratory failure requiring intubation.  Also during that admission she was treated for UTI.  Patient discharged to inpatient rehab 03/18/2019. Since then her developed AKI on CKD. Nephrology following, concerned about HRS. She has been oliguric despite diuretics. Creatinine has been increasing.  We were asked to see for management of decompensated cirrhosis.  Patient's main complaint is that of increasing abdominal girth.  She has no abdominal pain.  She has IBS, says her bowels are moving fine in fact she had a bowel movement earlier today     Past Medical History:  Diagnosis Date  . Cirrhosis ([place])   . Diabetes mellitus without complication ([place])   .  Diabetic retinopathy ([place])   . Diabetic retinopathy ([place])   . GERD (gastroesophageal reflux disease)   . High cholesterol   . Hypertension   . Patient is Jehovah's Witness   . Sarcoidosis   . Stroke ([place])   . Subdural hematoma [facility])     Past Surgical History:  Procedure Laterality Date  . CHOLECYSTECTOMY    . IR CT HEAD LTD  03/14/2019  . IR  PERCUTANEOUS ART THROMBECTOMY/INFUSION INTRACRANIAL INC DIAG ANGIO  03/14/2019  . KNEE SURGERY    . RADIOLOGY WITH ANESTHESIA N/A 03/14/2019   Procedure: RADIOLOGY WITH ANESTHESIA;  Surgeon: Luanne Bras, MD;  Location: [place];  Service: Radiology;  Laterality: N/A;    Prior to Admission medications   Medication Sig Start Date End Date Taking? Authorizing Provider  amoxicillin-clavulanate (AUGMENTIN) 500-125 MG tablet Take 1 tablet (500 mg total) by mouth 2 (two) times daily. 03/18/19   Donzetta Starch, NP  co-enzyme Q-10 30 MG capsule Take 30 mg by mouth 2 (two) times daily.    [provider]  furosemide (LASIX) 40 MG tablet Take 1 tablet (40 mg total) by mouth daily. 03/19/19   Donzetta Starch, NP  insulin aspart (NOVOLOG) 100 UNIT/ML injection Inject 0-15 Units into the skin 4 (four) times daily -  with meals and at bedtime. 03/18/19   Donzetta Starch, NP  insulin aspart (NOVOLOG) 100 UNIT/ML injection Inject 5 Units into the skin 3 (three) times daily with meals. 03/18/19   Donzetta Starch, NP  lactulose (CHRONULAC) 10 GM/15ML solution Take 15 mLs (10 g total) by mouth 2 (two) times daily. Patient taking differently: Take 10 g by mouth 2 (two) times daily as needed for mild constipation.  01/16/19   Florencia Reasons, MD  magnesium oxide (MAG-OX) 400 (241.3 Mg) MG tablet Take 1 tablet (400 mg total) by mouth at bedtime. 03/18/19   Donzetta Starch, NP  pantoprazole (PROTONIX) 40 MG tablet Take 1 tablet (40 mg total) by mouth daily at 12 noon. 03/19/19   Donzetta Starch, NP  senna-docusate (SENOKOT-S) 8.6-50 MG tablet Take 1 tablet by mouth at bedtime as needed for mild constipation. 03/18/19   Donzetta Starch, NP  sodium chloride 0.9 % infusion Inject 40 mLs into the vein as needed (for administration of IV medications (carrier fluid)). 03/18/19   Donzetta Starch, NP  spironolactone (ALDACTONE) 25 MG tablet Take 0.5 tablets (12.5 mg total) by mouth daily. 03/19/19   Donzetta Starch, NP    Current  Facility-Administered Medications  Medication Dose Route Frequency Provider Last Rate Last Dose  . acetaminophen (TYLENOL) tablet 650 mg  650 mg Oral Q4H PRN Angiulli, Lavon Paganini, PA-C       Or  . acetaminophen (TYLENOL) solution 650 mg  650 mg Per Tube Q4H PRN Angiulli, Lavon Paganini, PA-C       Or  . acetaminophen (TYLENOL) suppository 650 mg  650 mg Rectal Q4H PRN Angiulli, Lavon Paganini, PA-C      . albumin human 5 % solution 25 g  25 g Intravenous Q6H Sanford, Ryan B, MD      . glipiZIDE (GLUCOTROL) tablet 2.5 mg  2.5 mg Oral QAC breakfast Charlett Blake, MD   2.5 mg at 03/23/19 0803  . insulin aspart (novoLOG) injection 0-15 Units  0-15 Units Subcutaneous TID WC & HS Cathlyn Parsons, PA-C   3 Units at 03/23/19 1236  . insulin aspart (novoLOG) injection 5 Units  5 Units  Subcutaneous TID WC AngiulliLavon Paganini, PA-C   5 Units at 03/23/19 1236  . magnesium oxide (MAG-OX) tablet 400 mg  400 mg Oral QHS Cathlyn Parsons, PA-C   400 mg at 03/22/19 2104  . pantoprazole (PROTONIX) EC tablet 40 mg  40 mg Oral Q1200 AngiulliLavon Paganini, PA-C   40 mg at 03/23/19 1236  . simethicone (MYLICON) chewable tablet 80 mg  80 mg Oral QID Charlett Blake, MD   80 mg at 03/23/19 1236  . sodium chloride (OCEAN) 0.65 % nasal spray 1 spray  1 spray Each Nare PRN AngiulliLavon Paganini, PA-C   1 spray at 03/21/19 0747  . sodium chloride flush (NS) 0.9 % injection 10-40 mL  10-40 mL Intracatheter Q12H Kirsteins, Luanna Salk, MD   10 mL at 03/23/19 0803    Allergies as of 03/18/2019 - Review Complete 03/18/2019  Allergen Reaction Noted  . Celecoxib Swelling 04/13/2014  . Rofecoxib Swelling 04/13/2014  . Ciprofloxacin Itching 04/25/2017  . Darvon [propoxyphene] Nausea And Vomiting 04/13/2014  . Hydrocodone-acetaminophen Nausea And Vomiting 04/13/2014  . Morphine Nausea And Vomiting 04/13/2014    Family History  Problem Relation Age of Onset  . Stroke Mother   . Bladder Cancer Mother   . CAD Father   . Prostate  cancer Father     Social History   Socioeconomic History  . Marital status: Legally Separated    Spouse name: Not on file  . Number of children: Not on file  . Years of education: Not on file  . Highest education level: Not on file  Occupational History  . Not on file  Social Needs  . Financial resource strain: Not on file  . Food insecurity:    Worry: Not on file    Inability: Not on file  . Transportation needs:    Medical: Not on file    Non-medical: Not on file  Tobacco Use  . Smoking status: Former Research scientist (life sciences)  . Smokeless tobacco: Never Used  Substance and Sexual Activity  . Alcohol use: No  . Drug use: No  . Sexual activity: Not on file  Lifestyle  . Physical activity:    Days per week: Not on file    Minutes per session: Not on file  . Stress: Not on file  Relationships  . Social connections:    Talks on phone: Not on file    Gets together: Not on file    Attends religious service: Not on file    Active member of club or organization: Not on file    Attends meetings of clubs or organizations: Not on file    Relationship status: Not on file  . Intimate partner violence:    Fear of current or ex partner: Not on file    Emotionally abused: Not on file    Physically abused: Not on file    Forced sexual activity: Not on file  Other Topics Concern  . Not on file  Social History Narrative  . Not on file    Review of Systems: All systems reviewed and negative except where noted in HPI.  Physical Exam: Vital signs in last 24 hours: Temp:  [97.4 F (36.3 C)-97.9 F (36.6 C)] 97.8 F (36.6 C) (04/28 0333) Pulse Rate:  [89-100] 89 (04/28 0333) Resp:  [15-18] 18 (04/28 0333) BP: (99-116)/(56-68) 99/56 (04/28 0333) SpO2:  [100 %] 100 % (04/28 0333) Weight:  [116.7 kg] 116.7 kg (04/28 0333) Last BM Date: 03/23/19 General:  Alert, female in NAD Psych:  Pleasant, cooperative. Normal mood and affect. Eyes:  Pupils equal. Ears:  Normal auditory acuity. Nose:   No deformity, discharge,  or lesions. Neck:  Supple; no masses Lungs:  Clear throughout to auscultation.   No wheezes, crackles, or rhonchi.  Some wheezing over trachea  Heart:  Regular rate and rhythm;+ murmur, pitting edema bilateral lower extremities  Abdomen:  Soft, nontender, significant subcutaneous edema BS active.  Large firm area in mid upper abdomen.      Rectal:  Deferred  Msk:  Symmetrical without gross deformities. . Neurologic:  Alert and  oriented x4;  grossly normal neurologically. Skin:  Intact without significant lesions or rashes.   Intake/Output from previous day: 04/27 0701 - 04/28 0700 In: 900 [P.O.:880; I.V.:20] Out: 450 [Urine:450] Intake/Output this shift: Total I/O In: 540 [P.O.:540] Out: -   Lab Results: No results for input(s): WBC, HGB, HCT, PLT in the last 72 hours. BMET Recent Labs    03/23/19 0937  NA 133*  K 3.2*  CL 104  CO2 20*  GLUCOSE 189*  BUN 78*  CREATININE 2.56*  CALCIUM 8.2*   LFT No results for input(s): PROT, ALBUMIN, AST, ALT, ALKPHOS, BILITOT, BILIDIR, IBILI in the last 72 hours. PT/INR Recent Labs    03/22/19 0423 03/23/19 0404  LABPROT 19.1* 19.2*  INR 1.6* 1.6*   Hepatitis Panel No results for input(s): HEPBSAG, HCVAB, HEPAIGM, HEPBIGM in the last 72 hours.   Studies/Results: No results found.   Tye Savoy, NP-C @  03/23/2019, 3:45 PM    Attending physician's note   I have taken a history, examined the patient and reviewed the chart. I agree with the Advanced Practitioner's note, impression and recommendations.  31 yr F with NASH cirrhosis decompensated with ascites and hepatic encephalopathy with volume overload and AKI concerning for HRS MELD 25 Abdominal ultrasound with dopplers Diagnostic paracentesis limit 100-200 cc removal to exclude spontaneous bacterial peritonitis, check cell count, albumin, protein level and culture. Avoid large volume paracentesis. IV albumin F/u AFP, CMP, INR and CBC Will  continue to follow     K. Denzil Magnuson , MD [phone]

## 2019-03-24 ENCOUNTER — Inpatient Hospital Stay (HOSPITAL_COMMUNITY): Payer: Medicare Other

## 2019-03-24 ENCOUNTER — Inpatient Hospital Stay (HOSPITAL_COMMUNITY): Payer: Medicare Other | Admitting: Speech Pathology

## 2019-03-24 ENCOUNTER — Encounter (HOSPITAL_COMMUNITY): Payer: Self-pay

## 2019-03-24 ENCOUNTER — Inpatient Hospital Stay (HOSPITAL_COMMUNITY): Payer: Self-pay

## 2019-03-24 ENCOUNTER — Other Ambulatory Visit: Payer: Self-pay

## 2019-03-24 ENCOUNTER — Inpatient Hospital Stay (HOSPITAL_COMMUNITY): Payer: Medicare Other | Admitting: Occupational Therapy

## 2019-03-24 ENCOUNTER — Inpatient Hospital Stay (HOSPITAL_COMMUNITY)
Admission: AD | Admit: 2019-03-24 | Discharge: 2019-04-26 | DRG: 441 | Disposition: E | Payer: Medicare Other | Source: Other Acute Inpatient Hospital | Attending: Family Medicine | Admitting: Family Medicine

## 2019-03-24 DIAGNOSIS — R140 Abdominal distension (gaseous): Secondary | ICD-10-CM

## 2019-03-24 DIAGNOSIS — Z6841 Body Mass Index (BMI) 40.0 and over, adult: Secondary | ICD-10-CM | POA: Diagnosis not present

## 2019-03-24 DIAGNOSIS — D631 Anemia in chronic kidney disease: Secondary | ICD-10-CM | POA: Diagnosis present

## 2019-03-24 DIAGNOSIS — K7460 Unspecified cirrhosis of liver: Secondary | ICD-10-CM | POA: Diagnosis not present

## 2019-03-24 DIAGNOSIS — Z9989 Dependence on other enabling machines and devices: Secondary | ICD-10-CM

## 2019-03-24 DIAGNOSIS — Z87891 Personal history of nicotine dependence: Secondary | ICD-10-CM | POA: Diagnosis not present

## 2019-03-24 DIAGNOSIS — IMO0001 Reserved for inherently not codable concepts without codable children: Secondary | ICD-10-CM

## 2019-03-24 DIAGNOSIS — K767 Hepatorenal syndrome: Secondary | ICD-10-CM

## 2019-03-24 DIAGNOSIS — R1110 Vomiting, unspecified: Secondary | ICD-10-CM

## 2019-03-24 DIAGNOSIS — E7800 Pure hypercholesterolemia, unspecified: Secondary | ICD-10-CM | POA: Diagnosis present

## 2019-03-24 DIAGNOSIS — K219 Gastro-esophageal reflux disease without esophagitis: Secondary | ICD-10-CM | POA: Diagnosis present

## 2019-03-24 DIAGNOSIS — R0600 Dyspnea, unspecified: Secondary | ICD-10-CM

## 2019-03-24 DIAGNOSIS — I6203 Nontraumatic chronic subdural hemorrhage: Secondary | ICD-10-CM | POA: Diagnosis present

## 2019-03-24 DIAGNOSIS — K766 Portal hypertension: Secondary | ICD-10-CM | POA: Diagnosis present

## 2019-03-24 DIAGNOSIS — E11319 Type 2 diabetes mellitus with unspecified diabetic retinopathy without macular edema: Secondary | ICD-10-CM | POA: Diagnosis present

## 2019-03-24 DIAGNOSIS — K7581 Nonalcoholic steatohepatitis (NASH): Secondary | ICD-10-CM | POA: Diagnosis not present

## 2019-03-24 DIAGNOSIS — Z8052 Family history of malignant neoplasm of bladder: Secondary | ICD-10-CM

## 2019-03-24 DIAGNOSIS — I63512 Cerebral infarction due to unspecified occlusion or stenosis of left middle cerebral artery: Secondary | ICD-10-CM | POA: Diagnosis present

## 2019-03-24 DIAGNOSIS — Z79899 Other long term (current) drug therapy: Secondary | ICD-10-CM

## 2019-03-24 DIAGNOSIS — N183 Chronic kidney disease, stage 3 (moderate): Secondary | ICD-10-CM | POA: Diagnosis present

## 2019-03-24 DIAGNOSIS — G4733 Obstructive sleep apnea (adult) (pediatric): Secondary | ICD-10-CM | POA: Diagnosis present

## 2019-03-24 DIAGNOSIS — R258 Other abnormal involuntary movements: Secondary | ICD-10-CM | POA: Diagnosis not present

## 2019-03-24 DIAGNOSIS — Z87440 Personal history of urinary (tract) infections: Secondary | ICD-10-CM

## 2019-03-24 DIAGNOSIS — Z8042 Family history of malignant neoplasm of prostate: Secondary | ICD-10-CM

## 2019-03-24 DIAGNOSIS — I10 Essential (primary) hypertension: Secondary | ICD-10-CM | POA: Diagnosis present

## 2019-03-24 DIAGNOSIS — G253 Myoclonus: Secondary | ICD-10-CM | POA: Diagnosis not present

## 2019-03-24 DIAGNOSIS — D869 Sarcoidosis, unspecified: Secondary | ICD-10-CM | POA: Diagnosis present

## 2019-03-24 DIAGNOSIS — K7290 Hepatic failure, unspecified without coma: Secondary | ICD-10-CM | POA: Diagnosis present

## 2019-03-24 DIAGNOSIS — E8770 Fluid overload, unspecified: Secondary | ICD-10-CM | POA: Diagnosis present

## 2019-03-24 DIAGNOSIS — N184 Chronic kidney disease, stage 4 (severe): Secondary | ICD-10-CM | POA: Diagnosis not present

## 2019-03-24 DIAGNOSIS — I69351 Hemiplegia and hemiparesis following cerebral infarction affecting right dominant side: Secondary | ICD-10-CM | POA: Diagnosis not present

## 2019-03-24 DIAGNOSIS — Z531 Procedure and treatment not carried out because of patient's decision for reasons of belief and group pressure: Secondary | ICD-10-CM | POA: Diagnosis present

## 2019-03-24 DIAGNOSIS — R188 Other ascites: Secondary | ICD-10-CM | POA: Diagnosis not present

## 2019-03-24 DIAGNOSIS — N179 Acute kidney failure, unspecified: Secondary | ICD-10-CM | POA: Diagnosis present

## 2019-03-24 DIAGNOSIS — I959 Hypotension, unspecified: Secondary | ICD-10-CM | POA: Diagnosis present

## 2019-03-24 DIAGNOSIS — Z7189 Other specified counseling: Secondary | ICD-10-CM | POA: Diagnosis not present

## 2019-03-24 DIAGNOSIS — R6889 Other general symptoms and signs: Secondary | ICD-10-CM

## 2019-03-24 DIAGNOSIS — Z66 Do not resuscitate: Secondary | ICD-10-CM | POA: Diagnosis present

## 2019-03-24 DIAGNOSIS — E119 Type 2 diabetes mellitus without complications: Secondary | ICD-10-CM

## 2019-03-24 DIAGNOSIS — Z9049 Acquired absence of other specified parts of digestive tract: Secondary | ICD-10-CM | POA: Diagnosis not present

## 2019-03-24 DIAGNOSIS — E1122 Type 2 diabetes mellitus with diabetic chronic kidney disease: Secondary | ICD-10-CM | POA: Diagnosis present

## 2019-03-24 DIAGNOSIS — J9600 Acute respiratory failure, unspecified whether with hypoxia or hypercapnia: Secondary | ICD-10-CM | POA: Diagnosis present

## 2019-03-24 DIAGNOSIS — Z515 Encounter for palliative care: Secondary | ICD-10-CM | POA: Diagnosis not present

## 2019-03-24 DIAGNOSIS — Z888 Allergy status to other drugs, medicaments and biological substances status: Secondary | ICD-10-CM

## 2019-03-24 DIAGNOSIS — Z823 Family history of stroke: Secondary | ICD-10-CM

## 2019-03-24 DIAGNOSIS — I129 Hypertensive chronic kidney disease with stage 1 through stage 4 chronic kidney disease, or unspecified chronic kidney disease: Secondary | ICD-10-CM | POA: Diagnosis present

## 2019-03-24 DIAGNOSIS — Z794 Long term (current) use of insulin: Secondary | ICD-10-CM

## 2019-03-24 DIAGNOSIS — R112 Nausea with vomiting, unspecified: Secondary | ICD-10-CM | POA: Diagnosis not present

## 2019-03-24 DIAGNOSIS — I6930 Unspecified sequelae of cerebral infarction: Secondary | ICD-10-CM

## 2019-03-24 DIAGNOSIS — Z8249 Family history of ischemic heart disease and other diseases of the circulatory system: Secondary | ICD-10-CM

## 2019-03-24 DIAGNOSIS — Z792 Long term (current) use of antibiotics: Secondary | ICD-10-CM

## 2019-03-24 DIAGNOSIS — Z885 Allergy status to narcotic agent status: Secondary | ICD-10-CM

## 2019-03-24 DIAGNOSIS — D696 Thrombocytopenia, unspecified: Secondary | ICD-10-CM | POA: Diagnosis present

## 2019-03-24 DIAGNOSIS — E785 Hyperlipidemia, unspecified: Secondary | ICD-10-CM | POA: Diagnosis present

## 2019-03-24 HISTORY — PX: IR PARACENTESIS: IMG2679

## 2019-03-24 LAB — URINALYSIS, ROUTINE W REFLEX MICROSCOPIC
Bilirubin Urine: NEGATIVE
Glucose, UA: NEGATIVE mg/dL
Ketones, ur: NEGATIVE mg/dL
Nitrite: NEGATIVE
Protein, ur: NEGATIVE mg/dL
Specific Gravity, Urine: 1.014 (ref 1.005–1.030)
pH: 5 (ref 5.0–8.0)

## 2019-03-24 LAB — CBC
HCT: 19 % — ABNORMAL LOW (ref 36.0–46.0)
Hemoglobin: 7.1 g/dL — ABNORMAL LOW (ref 12.0–15.0)
MCH: 26.5 pg (ref 26.0–34.0)
MCHC: 37.4 g/dL — ABNORMAL HIGH (ref 30.0–36.0)
MCV: 70.9 fL — ABNORMAL LOW (ref 80.0–100.0)
Platelets: 62 10*3/uL — ABNORMAL LOW (ref 150–400)
RBC: 2.68 MIL/uL — ABNORMAL LOW (ref 3.87–5.11)
RDW: 17.5 % — ABNORMAL HIGH (ref 11.5–15.5)
WBC: 6.3 10*3/uL (ref 4.0–10.5)
nRBC: 0.6 % — ABNORMAL HIGH (ref 0.0–0.2)

## 2019-03-24 LAB — BODY FLUID CELL COUNT WITH DIFFERENTIAL
Eos, Fluid: 0 %
Lymphs, Fluid: 26 %
Monocyte-Macrophage-Serous Fluid: 46 % — ABNORMAL LOW (ref 50–90)
Neutrophil Count, Fluid: 28 % — ABNORMAL HIGH (ref 0–25)
Total Nucleated Cell Count, Fluid: 37 cu mm (ref 0–1000)

## 2019-03-24 LAB — COMPREHENSIVE METABOLIC PANEL
ALT: 52 U/L — ABNORMAL HIGH (ref 0–44)
AST: 58 U/L — ABNORMAL HIGH (ref 15–41)
Albumin: 2.4 g/dL — ABNORMAL LOW (ref 3.5–5.0)
Alkaline Phosphatase: 221 U/L — ABNORMAL HIGH (ref 38–126)
Anion gap: 11 (ref 5–15)
BUN: 79 mg/dL — ABNORMAL HIGH (ref 8–23)
CO2: 20 mmol/L — ABNORMAL LOW (ref 22–32)
Calcium: 8.6 mg/dL — ABNORMAL LOW (ref 8.9–10.3)
Chloride: 103 mmol/L (ref 98–111)
Creatinine, Ser: 2.75 mg/dL — ABNORMAL HIGH (ref 0.44–1.00)
GFR calc Af Amer: 19 mL/min — ABNORMAL LOW (ref >60)
GFR calc non Af Amer: 16 mL/min — ABNORMAL LOW (ref >60)
Glucose, Bld: 173 mg/dL — ABNORMAL HIGH (ref 70–99)
Potassium: 3.4 mmol/L — ABNORMAL LOW (ref 3.5–5.1)
Sodium: 134 mmol/L — ABNORMAL LOW (ref 135–145)
Total Bilirubin: 2.7 mg/dL — ABNORMAL HIGH (ref 0.3–1.2)
Total Protein: 5.2 g/dL — ABNORMAL LOW (ref 6.5–8.1)

## 2019-03-24 LAB — PROTEIN / CREATININE RATIO, URINE
Creatinine, Urine: 139.02 mg/dL
Protein Creatinine Ratio: 0.07 mg/mg{Cre} (ref 0.00–0.15)
Total Protein, Urine: 10 mg/dL

## 2019-03-24 LAB — GRAM STAIN

## 2019-03-24 LAB — SODIUM, URINE, RANDOM: Sodium, Ur: <10 mmol/L

## 2019-03-24 LAB — PROTIME-INR
INR: 1.6 — ABNORMAL HIGH (ref 0.8–1.2)
Prothrombin Time: 19.1 seconds — ABNORMAL HIGH (ref 11.4–15.2)

## 2019-03-24 LAB — ALBUMIN, PLEURAL OR PERITONEAL FLUID?

## 2019-03-24 LAB — ALBUMIN, PLEURAL OR PERITONEAL FLUID: Albumin, Fluid: <1 g/dL

## 2019-03-24 LAB — AFP TUMOR MARKER: AFP, Serum, Tumor Marker: 2.6 ng/mL (ref 0.0–8.3)

## 2019-03-24 LAB — GLUCOSE, CAPILLARY
Glucose-Capillary: 164 mg/dL — ABNORMAL HIGH (ref 70–99)
Glucose-Capillary: 153 mg/dL — ABNORMAL HIGH (ref 70–99)
Glucose-Capillary: 156 mg/dL — ABNORMAL HIGH (ref 70–99)
Glucose-Capillary: 182 mg/dL — ABNORMAL HIGH (ref 70–99)

## 2019-03-24 LAB — PROTEIN, PLEURAL OR PERITONEAL FLUID: Total protein, fluid: <3 g/dL

## 2019-03-24 LAB — CREATININE, URINE, RANDOM: Creatinine, Urine: 134.51 mg/dL

## 2019-03-24 MED ORDER — CALCIUM CARBONATE ANTACID 1250 MG/5ML PO SUSP: 500.0000 mg | Freq: Four times a day (QID) | ORAL | Status: DC | PRN

## 2019-03-24 MED ORDER — MAGNESIUM OXIDE 400 (241.3 MG) MG PO TABS
400.0000 mg | ORAL_TABLET | Freq: Every day | ORAL | Status: DC
  Administered 2019-03-24: 22:00:00 400 mg via ORAL
  Filled 2019-03-24: qty 1

## 2019-03-24 MED ORDER — LIDOCAINE HCL 1 % IJ SOLN
INTRAMUSCULAR | Status: AC
  Filled 2019-03-24: qty 20

## 2019-03-24 MED ORDER — ONDANSETRON HCL 4 MG/2ML IJ SOLN
4.0000 mg | Freq: Four times a day (QID) | INTRAMUSCULAR | Status: DC | PRN
  Administered 2019-03-25: 4 mg via INTRAVENOUS
  Filled 2019-03-24: qty 2

## 2019-03-24 MED ORDER — SORBITOL 70 % SOLN: 30.0000 mL | Status: DC | PRN

## 2019-03-24 MED ORDER — MIDODRINE HCL 10 MG PO TABS: 10.0000 mg | ORAL_TABLET | Freq: Three times a day (TID) | ORAL | Status: AC

## 2019-03-24 MED ORDER — SIMETHICONE 80 MG PO CHEW: 80.0000 mg | CHEWABLE_TABLET | Freq: Four times a day (QID) | ORAL | 0 refills | Status: AC

## 2019-03-24 MED ORDER — PANTOPRAZOLE SODIUM 40 MG PO TBEC
40.0000 mg | DELAYED_RELEASE_TABLET | Freq: Every day | ORAL | Status: DC
  Administered 2019-03-25: 13:00:00 40 mg via ORAL
  Filled 2019-03-24: qty 1

## 2019-03-24 MED ORDER — MIDODRINE HCL 5 MG PO TABS
10.0000 mg | ORAL_TABLET | Freq: Three times a day (TID) | ORAL | Status: DC
  Administered 2019-03-24: 10 mg via ORAL
  Filled 2019-03-24: qty 2

## 2019-03-24 MED ORDER — INSULIN ASPART 100 UNIT/ML ~~LOC~~ SOLN: 0.0000 [IU] | Freq: Three times a day (TID) | SUBCUTANEOUS | 11 refills | Status: AC

## 2019-03-24 MED ORDER — LACTULOSE 10 GM/15ML PO SOLN
10.0000 g | Freq: Two times a day (BID) | ORAL | Status: DC
  Filled 2019-03-24 (×2): qty 15

## 2019-03-24 MED ORDER — ACETAMINOPHEN 325 MG PO TABS: 650.0000 mg | ORAL_TABLET | ORAL | Status: AC | PRN

## 2019-03-24 MED ORDER — RIFAXIMIN 550 MG PO TABS
550.0000 mg | ORAL_TABLET | Freq: Two times a day (BID) | ORAL | Status: DC
  Filled 2019-03-24 (×3): qty 1

## 2019-03-24 MED ORDER — MIDODRINE HCL 5 MG PO TABS
10.0000 mg | ORAL_TABLET | Freq: Three times a day (TID) | ORAL | Status: DC
  Administered 2019-03-24 – 2019-03-25 (×3): 10 mg via ORAL
  Filled 2019-03-24 (×4): qty 2

## 2019-03-24 MED ORDER — DOCUSATE SODIUM 283 MG RE ENEM
1.0000 | ENEMA | RECTAL | Status: DC | PRN
  Filled 2019-03-24: qty 1

## 2019-03-24 MED ORDER — HYDROXYZINE HCL 25 MG PO TABS: 25.0000 mg | ORAL_TABLET | Freq: Three times a day (TID) | ORAL | Status: DC | PRN

## 2019-03-24 MED ORDER — NEPRO/CARBSTEADY PO LIQD: 237.0000 mL | Freq: Three times a day (TID) | ORAL | Status: DC | PRN

## 2019-03-24 MED ORDER — CAMPHOR-MENTHOL 0.5-0.5 % EX LOTN: 1.0000 "application " | TOPICAL_LOTION | Freq: Three times a day (TID) | CUTANEOUS | Status: DC | PRN

## 2019-03-24 MED ORDER — ALBUMIN HUMAN 5 % IV SOLN
25.0000 g | Freq: Four times a day (QID) | INTRAVENOUS | Status: DC
  Filled 2019-03-24 (×3): qty 500

## 2019-03-24 MED ORDER — ACETAMINOPHEN 650 MG RE SUPP: 650.0000 mg | Freq: Four times a day (QID) | RECTAL | Status: DC | PRN

## 2019-03-24 MED ORDER — SODIUM CHLORIDE 0.9 % IV SOLN
50.0000 ug/h | INTRAVENOUS | Status: DC
  Administered 2019-03-24: 50 ug/h via INTRAVENOUS
  Filled 2019-03-24: qty 1

## 2019-03-24 MED ORDER — INSULIN ASPART 100 UNIT/ML ~~LOC~~ SOLN: 0.0000 [IU] | Freq: Every day | SUBCUTANEOUS | Status: DC

## 2019-03-24 MED ORDER — GLIPIZIDE 5 MG PO TABS: 2.5000 mg | ORAL_TABLET | Freq: Every day | ORAL | Status: AC

## 2019-03-24 MED ORDER — ONDANSETRON HCL 4 MG PO TABS: 4.0000 mg | ORAL_TABLET | Freq: Four times a day (QID) | ORAL | Status: DC | PRN

## 2019-03-24 MED ORDER — ALBUMIN HUMAN 5 % IV SOLN
25.0000 g | Freq: Four times a day (QID) | INTRAVENOUS | Status: DC
  Administered 2019-03-25 (×2): 25 g via INTRAVENOUS
  Filled 2019-03-24 (×5): qty 500

## 2019-03-24 MED ORDER — ALBUMIN HUMAN 5 % IV SOLN: 25.0000 g | Freq: Four times a day (QID) | INTRAVENOUS | Status: AC

## 2019-03-24 MED ORDER — LIDOCAINE HCL (PF) 1 % IJ SOLN
INTRAMUSCULAR | Status: AC | PRN
  Administered 2019-03-24: 10 mL

## 2019-03-24 MED ORDER — ZOLPIDEM TARTRATE 5 MG PO TABS: 5.0000 mg | ORAL_TABLET | Freq: Every evening | ORAL | Status: DC | PRN

## 2019-03-24 MED ORDER — INSULIN ASPART 100 UNIT/ML ~~LOC~~ SOLN
0.0000 [IU] | Freq: Three times a day (TID) | SUBCUTANEOUS | Status: DC
  Administered 2019-03-25: 2 [IU] via SUBCUTANEOUS
  Administered 2019-03-25: 5 [IU] via SUBCUTANEOUS

## 2019-03-24 MED ORDER — ACETAMINOPHEN 325 MG PO TABS: 650.0000 mg | ORAL_TABLET | Freq: Four times a day (QID) | ORAL | Status: DC | PRN

## 2019-03-24 NOTE — Plan of Care (Signed)
  Problem: Education: Goal: Knowledge of General Education information will improve Description Including pain rating scale, medication(s)/side effects and non-pharmacologic comfort measures Outcome: Progressing   

## 2019-03-24 NOTE — Progress Notes (Signed)
At the beginning of the shift, was given in report that patient had an order for albumin to be given but was in therapy & could not start. When nurse was about to hang, transportation came to take her to radiology. When patient came back, albumin was hung at rate of 18m/hr, but the rate dose not allow for the ordered dosage to be given. On call physician was informed. The bottles of albumin are 12.5 grams per 2513m Patient was noted to be shaking, but stated that she was cold. She was eating an [place]. Her oxygen saturation seemed low because of her cold fingers & her pulse rate was high. Her oxygen saturation was checked again after patient was calmed & had warmed up a bit. She was still in the chair & was a little upset about all of the tests & not knowing what was causing her ascites to build up so fast. Her temperature was taken & was WNL No signs of a reaction were noted. Cpap was applied & patient slept. She was able to urinate in the bathroom last night, but this morning had to be in/out cathed. Her volume was less that what she scanned for. Urine specimens ordered were sent to the lab. 3rd albumin bottle is hanging & is infusing via PICC line to the right upper arm. No acute distress noted, no c/o pain. Orders asked to be clarified by physician on rounds. Will continue to monitor.

## 2019-03-24 NOTE — Progress Notes (Signed)
First dose of Albumin was order for now, patient is now available ,still in therapy.

## 2019-03-24 NOTE — Patient Care Conference (Signed)
Inpatient RehabilitationTeam Conference and Plan of Care Update Date: 03/12/2019   Time: 11:00 am    Patient Name: Monica Pearson      Medical Record Number: [id number]  Date of Birth: 03/25/1946 Sex: Female         Room/Bed: 4M12C/4M12C-01 Payor Info: Payor: Theme park manager MEDICARE / Plan: UHC MEDICARE / Product Type: *No Product type* /    Admitting Diagnosis: l mca  Admit Date/Time:  03/18/2019  4:41 PM Admission Comments: No comment available   Primary Diagnosis:  <principal problem not specified> Principal Problem: <principal problem not specified>  Patient Active Problem List   Diagnosis Date Noted  . 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 Acute renal failure superimposed on stage 3 chronic kidney disease ([place]) 03/18/2019  . Left middle cerebral artery stroke ([place]) 03/18/2019  . AKI (acute kidney injury) ([place])   . Hepatic cirrhosis ([place])   . SDH (subdural hematoma) ([place])   . Dyslipidemia   . Tobacco abuse   . Acute blood loss anemia   . Acute respiratory failure ([place])   . Endotracheally intubated 03/15/2019  . Sarcoidosis of lung ([place]) 03/15/2019  . Bacteriuria with pyuria 03/15/2019  . Bradycardia 03/15/2019  . CKD stage 3 due to type 2 diabetes mellitus ([place]) 03/15/2019  . Type 2 diabetes mellitus with complication, with long-term current use of insulin ([place]) 03/15/2019  . Microcytic anemia 03/15/2019  . Thrombocytopenia ([place]) 03/15/2019  . Compromised airway   . Stroke (cerebrum) ([place]) 03/14/2019  . Stenosis of left carotid artery 03/14/2019  . Ascites   . Hypomagnesemia   . Hypokalemia   . Liver cirrhosis secondary to NASH ([place])   . Hyperammonemia ([place]) 01/14/2019  . Subdural hematoma ([place]) 01/14/2019  . Type 2 diabetes mellitus with vascular disease ([place]) 01/14/2019  . Acute hepatic encephalopathy 01/14/2019  . Acute metabolic encephalopathy 46/27/0350  . Hyperlipidemia 07/22/2017  . Hypertension 07/22/2017  . Obstructive sleep apnea treated with continuous  positive airway pressure (CPAP) 07/22/2017  . Acute CVA (cerebrovascular accident) ([place]) 12/15/2016  . TIA (transient ischemic attack) 12/14/2016  . GERD (gastroesophageal reflux disease) 12/14/2016  . Insulin dependent diabetes mellitus ([place]) 12/14/2016  . No blood products 12/14/2016    Expected Discharge Date: Expected Discharge Date: 03/12/2019  Team Members Present: Physician leading conference: Dr. Alysia Penna Social Worker Present: Ovidio Kin, LCSW Nurse Present: Blair Heys, RN PT Present: Michaelene Song, PT OT Present: Clyda Greener, OT SLP Present: Charolett Bumpers, SLP PPS Coordinator present : Gunnar Fusi     Current Status/Progress Goal Weekly Team Focus  Medical   poor endurance , edema limiting participation, worsening renal status  stabilize renal and hepatic function  assess need for transfer to acute, involve GI adn Nephro   Bowel/Bladder   Continent of bowel & bl;adder with episodes of incontinence of both, sometimes has to be cathed,, ascites, lasix discontinued, IV albumin in progress LBM 03/23/19  less episodes of incontinence  timed toileting   Swallow/Nutrition/ Hydration             ADL's   supervision for UB selfcare, mod assist for LB selfcare and transfers  supervision to modified independent  selfcare retraining, balance retraining, transfer training, AE/DME education, family/pt education   Mobility   mod assist bed mobility, min assist transfers and ambulation to 61f with RW  supervision bed mobility, transfers, and gait with LRAD  gait, activity tolerance, B LE strengthening, transfers   Communication  Safety/Cognition/ Behavioral Observations  Mod -Supervision A, basically Min A  Mod I  Complex problem solving, executive function startegies, complex recall strategies   Pain   no c/o pain  pain scale <2/10  assess & treat as needed   Skin   multiple eccchymotic areas, weeping of BLE, foams applied  no skin breakdown while  on rehab  assess q shift      *See Care Plan and progress notes for long and short-term goals.     Barriers to Discharge  Current Status/Progress Possible Resolutions Date Resolved   Physician    Medical stability     poor progress  transfer to acute then reassess rehab needs once stable      Nursing  Medical stability  ascites, BLE edema with weeping, being given albumin & multiple tests pending            PT                    OT                  SLP                SW                Discharge Planning/Teaching Needs:  HOme alone with son and daughter in-law checking and assisting with home management. She will not have 24 hr care due to son and daughter in-law work      Team Discussion:  Medically issues and will need to transfer to acute. She is progressing in therapies and may need to come back to CIR once stable. Pt wants to go home from acute. Family work and can check on her at home  Revisions to Treatment Plan:  Medical issues transferring to acute today    Continued Need for Acute Rehabilitation Level of Care: The patient requires daily medical management by a physician with specialized training in physical medicine and rehabilitation for the following conditions: Daily direction of a multidisciplinary physical rehabilitation program to ensure safe treatment while eliciting the highest outcome that is of practical value to the patient.: Yes Daily medical management of patient stability for increased activity during participation in an intensive rehabilitation regime.: Yes Daily analysis of laboratory values and/or radiology reports with any subsequent need for medication adjustment of medical intervention for : Other;Renal problems   I attest that I was present, lead the team conference, and concur with the assessment and plan of the team. Teleconference held due to COVID 19   Gumecindo Hopkin, Gardiner Rhyme 03/14/2019, 12:35 PM

## 2019-03-24 NOTE — H&P (Signed)
History and Physical    Monica Pearson PJA:[id number] DOB: Apr 15, 1946 DOA: 03/18/2019  PCP: Jenel Lucks, PA-C Consultants:  Tamala Julian - GI at [facility] - Endocrinology, [place] Patient coming from: Rehab; lives alone at [facility]; [facility] [name], [phone]  Chief Complaint: anasarca  HPI: Monica Pearson is a 73 y.o. 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  She was found to have a L MCA stroke L LCA near occlusive thrombus and underwent arteriogram with mechanical thrombectomy per IR.  She was discharged to CIR on 4/23.  Since she came to rehab she has been working really hard.  She has been feeling okay but yesterday she got a little depressed.  +ansarca - stomach, legs, thighs, feet.  She does not feel like she is making any urine.  No pain.  Increased SOB with exertion.  No confusion.   Rehab Course:  She has mild stroke deficits at this time, but long h/o cirrhosis.  During stroke work-up and IV contrast, she appears to have progressed to hepatorenal syndrome.  Nephrology consulted, has ascites with no plan for large-volume tap.  Likely needs stabilization from a renal failure standpoint.  She does not appear to need recurrent admission to rehab.  Review of Systems: As per HPI; otherwise review of systems reviewed and negative.   Ambulatory Status:  Ambulates with a walker at baseline      Past Medical History:  Diagnosis Date   Cirrhosis ([place])    Diabetes mellitus without complication ([place])    Diabetic retinopathy ([place])    Diabetic retinopathy ([place])    GERD (gastroesophageal reflux disease)    High cholesterol    Hypertension    Patient is Jehovah's Witness    Sarcoidosis    Stroke ([place])    Subdural hematoma ([place])          Past Surgical History:  Procedure Laterality Date   CHOLECYSTECTOMY     IR CT HEAD LTD   03/14/2019   IR PERCUTANEOUS ART THROMBECTOMY/INFUSION INTRACRANIAL INC DIAG ANGIO  03/14/2019   KNEE SURGERY     RADIOLOGY WITH ANESTHESIA N/A 03/14/2019   Procedure: RADIOLOGY WITH ANESTHESIA;  Surgeon: Luanne Bras, MD;  Location: [place];  Service: Radiology;  Laterality: N/A;    Social History        Socioeconomic History   Marital status: Legally Separated    Spouse name: Not on file   Number of children: Not on file   Years of education: Not on file   Highest education level: Not on file  Occupational History   Not on file  Social Needs   Financial resource strain: Not on file   Food insecurity:    Worry: Not on file    Inability: Not on file   Transportation needs:    Medical: Not on file    Non-medical: Not on file  Tobacco Use   Smoking status: Former Smoker   Smokeless tobacco: Never Used  Substance and Sexual Activity   Alcohol use: No   Drug use: No   Sexual activity: Not on file  Lifestyle   Physical activity:    Days per week: Not on file    Minutes per session: Not on file   Stress: Not on file  Relationships   Social connections:    Talks on phone: Not on file    Gets together: Not on file  Attends religious service: Not on file    Active member of club or organization: Not on file    Attends meetings of clubs or organizations: Not on file    Relationship status: Not on file   Intimate partner violence:    Fear of current or ex partner: Not on file    Emotionally abused: Not on file    Physically abused: Not on file    Forced sexual activity: Not on file  Other Topics Concern   Not on file  Social History Narrative   Not on file         Allergies  Allergen Reactions   Celecoxib Swelling    Ankles swell   Rofecoxib Swelling    Ankles swell   Ciprofloxacin Itching   Darvon [Propoxyphene] Nausea And Vomiting   Hydrocodone-Acetaminophen Nausea And Vomiting    Morphine Nausea And Vomiting         Family History  Problem Relation Age of Onset   Stroke Mother    Bladder Cancer Mother    CAD Father    Prostate cancer Father            Prior to Admission medications   Medication Sig Start Date End Date Taking? Authorizing Provider  amoxicillin-clavulanate (AUGMENTIN) 500-125 MG tablet Take 1 tablet (500 mg total) by mouth 2 (two) times daily. 03/18/19   Donzetta Starch, NP  co-enzyme Q-10 30 MG capsule Take 30 mg by mouth 2 (two) times daily.    [provider]  furosemide (LASIX) 40 MG tablet Take 1 tablet (40 mg total) by mouth daily. 03/19/19   Donzetta Starch, NP  insulin aspart (NOVOLOG) 100 UNIT/ML injection Inject 0-15 Units into the skin 4 (four) times daily -  with meals and at bedtime. 03/18/19   Donzetta Starch, NP  insulin aspart (NOVOLOG) 100 UNIT/ML injection Inject 5 Units into the skin 3 (three) times daily with meals. 03/18/19   Donzetta Starch, NP  lactulose (CHRONULAC) 10 GM/15ML solution Take 15 mLs (10 g total) by mouth 2 (two) times daily. Patient taking differently: Take 10 g by mouth 2 (two) times daily as needed for mild constipation.  01/16/19   Florencia Reasons, MD  magnesium oxide (MAG-OX) 400 (241.3 Mg) MG tablet Take 1 tablet (400 mg total) by mouth at bedtime. 03/18/19   Donzetta Starch, NP  pantoprazole (PROTONIX) 40 MG tablet Take 1 tablet (40 mg total) by mouth daily at 12 noon. 03/19/19   Donzetta Starch, NP  senna-docusate (SENOKOT-S) 8.6-50 MG tablet Take 1 tablet by mouth at bedtime as needed for mild constipation. 03/18/19   Donzetta Starch, NP  sodium chloride 0.9 % infusion Inject 40 mLs into the vein as needed (for administration of IV medications (carrier fluid)). 03/18/19   Donzetta Starch, NP  spironolactone (ALDACTONE) 25 MG tablet Take 0.5 tablets (12.5 mg total) by mouth daily. 03/19/19   Donzetta Starch, NP    Physical Exam:       Vitals:   03/23/19 2149 03/23/19 2151 03/23/19  2300 03/08/2019 0513  BP: 129/69   (!) 117/50  Pulse: 99 (!) 107 91 92  Resp: 20   18  Temp: (!) 97.5 F (36.4 C)   97.6 F (36.4 C)  TempSrc: Axillary     SpO2: (!) 83% 93% 100% 99%  Weight:    118.8 kg  Height:          General: Appears calm and comfortable  and is NAD but with diffuse anasarca, ?mild cognitive slowing  Eyes:  EOMI, normal lids, iris  ENT:  grossly normal hearing, lips & tongue, mmm  Neck:  no LAD, masses or thyromegaly  Cardiovascular:  RRR, no m/r/g. 2-3+ diffuse LE edema.   Respiratory:   CTA bilaterally with no wheezes/rales/rhonchi.  Normal respiratory effort.  Abdomen: Marked diffuse ascites  Skin:  no rash or induration seen on limited exam  Musculoskeletal:  grossly normal tone BUE/BLE, good ROM, no bony abnormality  Psychiatric:  blunted mood and affect, speech fluent and appropriate, AOx3, ?mild cognitive slowing  Neurologic:  CN 2-12 grossly intact, moves all extremities in coordinated fashion, sensation intact    Radiological Exams on Admission:  ImagingResults(Last48hours)  US Abdomen Complete  Result Date: 03/23/2019 CLINICAL DATA:  Cirrhosis.  Ascites. EXAM: ABDOMEN ULTRASOUND COMPLETE COMPARISON:  None. FINDINGS: Gallbladder: Surgically absent. Common bile duct: Diameter: 4 mm, within normal limits. Liver: Advanced hepatic cirrhosis seen. No liver masses are visualized. Portal vein is patent on color Doppler imaging with normal direction of blood flow towards the liver. IVC: No abnormality visualized. Pancreas: Visualized portion unremarkable. Spleen: Size and appearance within normal limits. Right Kidney: Length: 10.8 cm. Echogenicity within normal limits. No mass or hydronephrosis visualized. Left Kidney: Length: 11.5 cm. Echogenicity within normal limits. No mass or hydronephrosis visualized. Abdominal aorta: No aneurysm visualized. Other findings: Moderate amount of ascites. IMPRESSION: Hepatic cirrhosis and  moderate ascites. No definite hepatic mass visualized. Prior cholecystectomy.  No evidence of biliary ductal dilatation. Electronically Signed   By: Earle Gell M.D.   On: 03/23/2019 22:06   US Liver Doppler  Result Date: 03/23/2019 CLINICAL DATA:  Cirrhosis, ascites EXAM: DUPLEX ULTRASOUND OF LIVER TECHNIQUE: Color and duplex Doppler ultrasound was performed to evaluate the hepatic in-flow and out-flow vessels. COMPARISON:  CT 07/15/2017 FINDINGS: Portal Vein: No occlusion or thrombus. 1.1 cm diameter. Velocities (all hepatopetal): Main:  17-32 cm/sec Right:  13 cm/sec Left:  14 cm/sec Hepatic Vein Velocities (all hepatofugal): Right:  21 cm/sec Middle:  23 cm/sec Left:  20 cm/sec IVC (intrahepatic): 11 cm/seconds Hepatic Artery Velocity:  35 cm/sec Spleen 8.6 x 4.2 x 4.9 cm (volume = 93 cm^3). Splenic Vein: No occlusion or thrombus. Velocity: 18 cm/sec Varices: None identified Ascites: Present IMPRESSION: 1. Unremarkable hepatic vascular Doppler evaluation. 2. Persistent abdominal ascites. Electronically Signed   By: Lucrezia Europe M.D.   On: 03/23/2019 07:59   Vas [place] Upper Extremity Venous Duplex  Result Date: 03/23/2019 UPPER VENOUS STUDY  Indications: Swelling Performing Technologist: Oliver Hum RVT  Examination Guidelines: A complete evaluation includes B-mode imaging, spectral Doppler, color Doppler, and power Doppler as needed of all accessible portions of each vessel. Bilateral testing is considered an integral part of a complete examination. Limited examinations for reoccurring indications may be performed as noted.  Right Findings: +----------+------------+---------+-----------+----------+-------+  RIGHT      Compressible Phasicity Spontaneous Properties Summary  +----------+------------+---------+-----------+----------+-------+  IJV            Full        Yes        Yes                         +----------+------------+---------+-----------+----------+-------+  Subclavian     Full        Yes         Yes                         +----------+------------+---------+-----------+----------+-------+  Axillary       Full        Yes        Yes                         +----------+------------+---------+-----------+----------+-------+  Brachial       Full        Yes        Yes                         +----------+------------+---------+-----------+----------+-------+  Radial         Full                                               +----------+------------+---------+-----------+----------+-------+  Ulnar          Full                                               +----------+------------+---------+-----------+----------+-------+  Cephalic       None                                      Chronic  +----------+------------+---------+-----------+----------+-------+  Basilic        Full                                               +----------+------------+---------+-----------+----------+-------+  Left Findings: +----------+------------+---------+-----------+----------+-------+  LEFT       Compressible Phasicity Spontaneous Properties Summary  +----------+------------+---------+-----------+----------+-------+  Subclavian     Full        Yes        Yes                         +----------+------------+---------+-----------+----------+-------+  Summary:  Right: No evidence of deep vein thrombosis in the upper extremity. 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  Left: No evidence of thrombosis in the subclavian.  *See table(s) above for measurements and observations.  Diagnosing physician: Harold Barban MD Electronically signed by Harold Barban MD on 03/23/2019 at 5:11:50 PM.    Final      EKG: not done today   Labs on Admission: I have personally reviewed the available labs and imaging studies at the time of the admission.  Pertinent labs:   Glucose 173 BUN 79/Creatinine 2.75/GFR 19; 67/1.97/29 on 4/24; 81/1.78/32 on 4/19; 55/1.34/45 on 4/7 AP 221 NH4  44 on 4/27; 55 on 4/19 Albumin 2.4 AST 59/ALT 52/Bili 2.7 WBC 6.3 Hgb 7.1; 7.9 on 4/24 Platelets 62 - stable INR 1.6 - stable UA: small Hgb, few bacteria Urine protein 10 UPC 0.07 Urine Na++ <10 Urine creatinine 139.02 Paracentesis: <1 albumin; <3 protein, 37 WBC  Assessment/Plan Primary Problem: Hepatorenal syndrome Active Problems: Left middle cerebral artery stroke (HCC) Ascites HTN Acute renal failure on stage 3 CKD IDDM Liver cirrhosis secondary to NASH OSA on CPAP   Hepatorenal syndrome -Patient with known h/o both NASH cirrhosis  and CKD -Appears to have had progressive renal failure throughout hospitalization and rehab -She is no longer appropriate for rehab and so will be transferred back to the acute hospital -The concern is that revascularization and contrast-induced nephropathy may have precipitated this change -See below for each individual issue  Left middle cerebral artery stroke [facility]) -Patient is s/p IR angiogram and mechanical thrombectomy of her L ICA -She has made good improvement in rehab  -Her ongoing rehab potential was limited by her ongoing acute medical problems  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 -Patient with known cryptogenic/NASH cirrhosis which has been decompensated with ascites and anasarca -MELD/MELD-Na score is 24/26, with a mortality rate of 19.6% -Thrombocytopenia is stable and likely a result of progressive hepatic failure -Continue Lactulose to prevent encephalopathy -Diagnostic paracentesis performed today -Low suspicion for SBP -SAAG is 2.4, indicative of portal hypertension  -Poorly responsive to diuretics previously, holding for now -GI is following  Acute renal failure on stage 3 CKD -2 gram Na++ diet with fluid restriction -Follow daily CMP -Place foley catheter to allow for strict I/Os - she is not currently making urine -Nephrology has recommended 25 g Albumin q6h x 48h and then daily 25-50 grams -She  was started on Octreotide today, as well -FeNa is 0.07%, suggestive of prerenal azotemia -This is likely from extremely poor perfusion in a patient with progressive hepatorenal syndrome -Nephrology is following -Holding diuretics, per renal -Prior UTI with Klebsiella and Enterococcus, on Augmentin  Hypotension, with a h/o HTN -Previously started on Midodrine for persistent hypotension  IDDM -Continue to hold Glucotrol -Recent A1c 6.2 -Will cover with sensitive-scale SSI  OSA on CPAP -Continue CPAP    DVT prophylaxis: SCDs Code Status:  Full  Family Communication: None present  Disposition Plan:  Home once clinically improved Consults called: GI; nephrology Admission status: Admit - It is my clinical opinion that admission to [facility] is reasonable and necessary because of the expectation that this patient will require hospital care that crosses at least 2 midnights to treat this condition based on the medical complexity of the problems presented.  Given the aforementioned information, the predictability of an adverse outcome is felt to be significant.    Karmen Bongo MD [facility]   How to contact the [facility] Attending or Consulting provider [place] or covering provider during after hours [place], for this patient?  1. Check the care team in [facility] and look for a) attending/consulting [facility] provider listed and b) the [facility] team listed 2. Log into [email or web address] and use [place]'s universal password to access. If you do not have the password, please contact the hospital operator. 3. Locate the [facility] provider you are looking for under [facility] and page to a number that you can be directly reached. 4. If you still have difficulty reaching the provider, please page the [facility] (Director on Call) for the Hospitalists listed on amion for assistance.   03/23/2019, 11:57 AM

## 2019-03-24 NOTE — Progress Notes (Signed)
Physical Therapy Discharge Summary  Patient Details  Name: Monica Pearson MRN: [id number] Date of Birth: 03-26-1946  Today's Date: 03/11/2019 PT Individual Time:Session1:  4827-0786; Arthor Captain: 1130-1200 PT Individual Time Calculation (min): Session1: 32 min; Session2: 30 min  Session1: Patient seated in w/c in room.  RN in room finishing medications.  Requesting to toilet.  Sit to stand to RW min A, ambulated to bathroom with RW and CGA.  Stand to sit on low toilet with grabbar and mod A for lowering.  Patient sit to stand after toileting mod A and standing for hygiene with A and for donning new brief.  Patient ambulated back to sink to wash hands with SBA then to w/c with CGA with RW.  Seated for UE therex including bicep curls and overhead shoulder flexion with 2# weight x 10 reps.  Patient left seated in w/c with alarm belt activated and call bell/needs in reach.  [place]:  Patient seated in w/c in room, reports TED stockings cutting into leg and repositioned.  Patient sit to stand min A and ambulated into hallway with RW and CGA.  Turned to return to room after about 29' and MD stopped pt in hallway to report plans to return pt to acute side of hospital.  Patient stood in hallway about 2 minutes, then returned to room sitting EOB with min A.  NP in from Gastroenterology to assess abdomen, lungs, etc.  Patient sit to supine with mod A and assist to reposition.  Elevated HOB for comfort and pt left in supine with call bell in reach and bed alarm activated.    Patient has met 0 of 10 long term goals due to Patient with medical issues and d/c back to acute.  Patient to discharge at an ambulatory level Mod Assist.   Patient's care partner not addressed due to patient d/c back to acute to provide the necessary N/A assistance at discharge.  Reasons goals not met: Patient with ongoing medical issues and discharged back to acute care setting.  Recommendation:  Patient will benefit from ongoing skilled PT  services in acute to continue to advance safe functional mobility, address ongoing impairments in strength, balance, endurance, safety and minimize fall risk.  Equipment: No equipment provided  Reasons for discharge: change in medical status  Patient/family agrees with progress made and goals achieved: No  PT Discharge Precautions/Restrictions Precautions Precautions: Fall;Other (comment) Precaution Comments: weeping skin Restrictions Weight Bearing Restrictions: No Pain Pain Assessment Pain Scale: Faces Faces Pain Scale: Hurts little more Pain Type: Acute pain Pain Location: Leg Pain Orientation: Right;Left Pain Descriptors / Indicators: Aching;Tightness Pain Onset: On-going Pain Intervention(s): Repositioned Vision/Perception  Perception Perception: Within Functional Limits Praxis Praxis: Intact  Cognition Overall Cognitive Status: Within Functional Limits for tasks assessed Arousal/Alertness: Awake/alert Orientation Level: Oriented X4 Attention: Selective Focused Attention: Appears intact Sustained Attention: Appears intact Memory: Impaired Memory Impairment: Decreased short term memory Awareness: Appears intact Safety/Judgment: Appears intact Sensation Sensation Light Touch: Appears Intact Coordination Gross Motor Movements are Fluid and Coordinated: No Fine Motor Movements are Fluid and Coordinated: No Coordination and Movement Description: impaired due to strength deficits Motor  Motor Motor: Within Functional Limits Motor - Discharge Observations: generalized weakness  Mobility Bed Mobility Bed Mobility: Sit to Supine;Scooting to [facility] Sit to Supine: Moderate Assistance - Patient 50-74% Scooting to HOB: Supervision/Verbal Cueing Transfers Transfers: Sit to Stand;Stand to Sit;Stand Pivot Transfers Sit to Stand: Minimal Assistance - Patient > 75% Stand to Sit: Minimal Assistance - Patient > 75% Stand Pivot  Transfers: Minimal Assistance - Patient >  75% Transfer (Assistive device): Rolling walker Locomotion  Gait Ambulation: Yes Gait Assistance: Minimal Assistance - Patient > 75% Gait Distance (Feet): 100 Feet Assistive device: Rolling walker Gait Assistance Details: Verbal cues for precautions/safety;Verbal cues for safe use of DME/AE Gait Assistance Details: especially backing up to chair/toilet Gait Gait Pattern: Impaired Gait Pattern: Step-through pattern;Decreased stride length;Shuffle;Decreased trunk rotation Stairs / Additional Locomotion Stairs: No Wheelchair Mobility Wheelchair Mobility: No  Trunk/Postural Assessment  Cervical Assessment Cervical Assessment: Exceptions to WFL(forward head) Thoracic Assessment Thoracic Assessment: Exceptions to WFL(rounded shoulders) Lumbar Assessment Lumbar Assessment: Exceptions to WFL(ant pelvic tilt due to heavy and weak abdominal wall) Postural Control Postural Control: Deficits on evaluation Righting Reactions: delayed Postural Limitations: as above  Balance Balance Balance Assessed: Yes Static Sitting Balance Static Sitting - Balance Support: Feet supported Static Sitting - Level of Assistance: 6: Modified independent (Device/Increase time) Dynamic Sitting Balance Dynamic Sitting - Balance Support: During functional activity;Feet supported Dynamic Sitting - Level of Assistance: 5: Stand by assistance Dynamic Sitting - Balance Activities: Reaching for weighted objects;Lateral lean/weight shifting Sitting balance - Comments: difficulty reaching out of BOS due to girth of abdomen with ascites Static Standing Balance Static Standing - Balance Support: During functional activity;Bilateral upper extremity supported Static Standing - Level of Assistance: 5: Stand by assistance Dynamic Standing Balance Dynamic Standing - Balance Support: During functional activity;Bilateral upper extremity supported Dynamic Standing - Level of Assistance: 4: Min assist Extremity Assessment       RLE Assessment RLE Assessment: Exceptions to [facility] Active Range of Motion (AROM) Comments: AROM WFL RLE Strength Right Hip Flexion: 3-/5 Right Knee Flexion: 3+/5 Right Knee Extension: 4-/5 Right Ankle Dorsiflexion: 4/5 LLE Assessment LLE Assessment: Exceptions to [facility] Active Range of Motion (AROM) Comments: Limited due to generalized edema LLE Strength Left Hip Flexion: 3+/5 Left Knee Flexion: 4-/5 Left Knee Extension: 4/5 Left Ankle Dorsiflexion: 4/5    Reginia Naas 03/03/2019, 4:25 PM  Magda Kiel, PT

## 2019-03-24 NOTE — Progress Notes (Signed)
Occupational Therapy Session Note  Patient Details  Name: Monica Pearson MRN: [id number] Date of Birth: [birth year]  Today's Date: 03/20/2019 OT Individual Time: 0801-0906 OT Individual Time Calculation (min): 65 min    Short Term Goals: Week 1:  OT Short Term Goal 1 (Week 1): Pt will demonstrate improved R hand coordination to be able use her utensils to cut a piece of food.  OT Short Term Goal 2 (Week 1): Pt will use AE to don pant with min A.   OT Short Term Goal 3 (Week 1): pt will use AE to don socks with set up. OT Short Term Goal 4 (Week 1): Pt will sit to stand to RW with S. OT Short Term Goal 5 (Week 1): Pt will complete 2/3 steps of toileting with S.  Skilled Therapeutic Interventions/Progress Updates:    Pt in bed to start session.  She was worried about her current situation as well as her not having her breakfast yet.  She was able to then transfer from supine to sitting with max assist for management of her LEs off of the bed and then transition into sitting.  She was able to then use the RW for support and complete stand pivot transfer to the wheelchair for bathing.  She completed UB bathing with supervision from supported sitting in the wheelchair.  Max assist for removal of gripper socks, washing of lower legs, and donning AFO and new gripper socks. Pt left in chair with nursing present with call button and phone in reach.    Therapy Documentation Precautions:  Precautions Precautions: Fall, Other (comment) Precaution Comments: weeping skin Restrictions Weight Bearing Restrictions: No  Pain: Pain Assessment Pain Scale: Faces Pain Score: 1  Pain Type: Chronic pain Pain Location: Abdomen Pain Descriptors / Indicators: Dull Pain Intervention(s): Repositioned Multiple Pain Sites: No ADL: See Care Tool Section for some details of ADL  Therapy/Group: Individual Therapy  Gabor Lusk OTR/L 03/22/2019, 11:54 AM

## 2019-03-24 NOTE — Progress Notes (Signed)
Social Work  Discharge Note  The overall goal for the admission was met for:   Discharge location: No-transfer to acute today  Length of Stay: No-7 days  Discharge activity level: No-min level  Home/community participation: Yes  Services provided included: MD, RD, PT, OT, RN, CM, Pharmacy and SW  Financial Services: Private Insurance: [facility]  Follow-up services arranged: Other: transferring to acute  Comments (or additional information):made insurance aware of transfer.  Patient/Family verbalized understanding of follow-up arrangements: Yes  Individual responsible for coordination of the follow-up plan: son  Confirmed correct DME delivered: Elease Hashimoto 03/02/2019    Elease Hashimoto

## 2019-03-24 NOTE — Progress Notes (Signed)
Patient has been on rehab couple of days and her ascites got worse over the weekend  Yesterday, she had a renal and GI consults,more tests  were ordered .Today because her renal functions got worse, medical team decided to move her on acute care for a different level of care. Room [phone], report was given.

## 2019-03-24 NOTE — Procedures (Signed)
PROCEDURE SUMMARY:  Successful image-guided paracentesis from the right lateral abdomen.  Yielded 200 milliliters of clear yellow fluid.  No immediate complications.  EBL = 5 mL. Patient tolerated well.   Specimen was sent for labs.  Claris Pong Louk PA-C 03/25/2019 2:03 PM

## 2019-03-24 NOTE — Progress Notes (Signed)
Notified MD, Lorin Mercy, pt is not voiding. No urine output since admission this afternoon.

## 2019-03-24 NOTE — Progress Notes (Signed)
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 UNa < 10, UOP 431m yesterday .  SCr inc to 2.75 . GI following . For diagnostic paracentesis today . UA w/o sig hematuria; UP/C 0.07.   .Marland KitchenAbd UKoreaw/o HN or acute renal issues; No liver masses or abnormal portal flow; mod ascites  04/28 0701 - 04/29 0700 In: 1050 [P.O.:540; I.V.:10; IV Piggyback:500] Out: 450 [Urine:450]  Filed Weights   03/22/19 0510 03/23/19 0333 03/23/2019 0513  Weight: 116 kg 116.7 kg 118.8 kg    Scheduled Meds: . glipiZIDE  2.5 mg Oral QAC breakfast  . insulin aspart  0-15 Units Subcutaneous TID WC & HS  . insulin aspart  5 Units Subcutaneous TID WC  . lidocaine      . magnesium oxide  400 mg Oral QHS  . midodrine  10 mg Oral TID WC  . pantoprazole  40 mg Oral Q1200  . rifaximin  550 mg Oral BID  . simethicone  80 mg Oral QID  . sodium chloride flush  10-40 mL Intracatheter Q12H   Continuous Infusions: . albumin human 12.5 g (03/16/2019 0753)  . octreotide  (SANDOSTATIN)    IV infusion     PRN Meds:.acetaminophen **OR** acetaminophen (TYLENOL) oral liquid 160 mg/5 mL **OR** acetaminophen, sodium chloride  Current Labs: reviewed  Results for TWILDER, AMODEI(MRN [id number] as of 03/09/2019 14:18  Ref. Range 02/28/2019 05:21  Protein Creatinine Ratio Latest Ref Range: 0.00 - 0.15 mg/mgCre 0.07  Sodium, Urine Latest Units: mmol/L <10    Physical Exam:  Blood pressure (!) 117/50, pulse 92, temperature 97.6 F (36.4 C), resp. rate 18, height 5' 3"  (1.6 m), weight 118.8 kg, SpO2 99 %. GEN: Chronically ill appaering, in wheelchair ENT: Temporal wasting EYES: Scleral icterus present CV: Regular, nl s1s2 PULM: diminished throughout, esp in bases ABD: severely distended, soft, nt SKIN: diffuse anasarca of abd and legs, spontaneious weeping EXT:4+ weeping LEE into trunk  A 1. 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 Hypervolemia/Anasarca 3. NAFLD cirrhosis, decompensated; follows [facility]; GI following. For diagnostic paracentesis today 4. 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 Dm2 6. Klebsiella and enterococcal UTI on Augmention  P . Cont albumin 25g q6h for 48h, then daily albumin 25-50g . Begin ontreotide and midodrine . Cont to hold diuretics . Pt and son SDorita Frayupdated . Cont Na and fluid restriction . F/u diagnostic paracentesis . Daily weights, Daily Renal Panel, Strict I/Os, Avoid nephrotoxins (NSAIDs, judicious IV Contrast)   RPearson GrippeMD 03/06/2019, 2:17 PM  Recent Labs  Lab 03/19/19 0610 03/23/19 0937 03/02/2019 0415  NA 139 133* 134*  K 3.5 3.2* 3.4*  CL 107 104 103  CO2 22 20* 20*  GLUCOSE 108* 189* 173*  BUN 67* 78* 79*  CREATININE 1.97* 2.56* 2.75*  CALCIUM 8.3* 8.2* 8.6*   Recent Labs  Lab 03/18/19 0400 03/19/19 0610 03/02/2019 0415  WBC 7.6 6.7 6.3  NEUTROABS  --  4.0  --   HGB 7.7* 7.9* 7.1*  HCT RESULTS UNAVAILABLE DUE TO INTERFERING SUBSTANCE RESULTS UNAVAILABLE DUE TO INTERFERING SUBSTANCE 19.0*  MCV RESULTS UNAVAILABLE DUE TO INTERFERING SUBSTANCE RESULTS UNAVAILABLE DUE TO INTERFERING SUBSTANCE 70.9*  PLT 70* 64* 62*

## 2019-03-24 NOTE — Progress Notes (Signed)
Patient needs a different level of care ,so she will be transfer to Room 5W30, report was given.

## 2019-03-24 NOTE — Progress Notes (Addendum)
[place] PHYSICAL MEDICINE & REHABILITATION PROGRESS NOTE   Subjective/Complaints:  Discussed neg doppler RUE Abd [place] moderate ascities and advanced cirrhosis but no other abnormalities noted  ROS Neg CP, SOB, N/V/D   Objective:   US Abdomen Complete  Result Date: 03/23/2019 CLINICAL DATA:  Cirrhosis.  Ascites. EXAM: ABDOMEN ULTRASOUND COMPLETE COMPARISON:  None. FINDINGS: Gallbladder: Surgically absent. Common bile duct: Diameter: 4 mm, within normal limits. Liver: Advanced hepatic cirrhosis seen. No liver masses are visualized. Portal vein is patent on color Doppler imaging with normal direction of blood flow towards the liver. IVC: No abnormality visualized. Pancreas: Visualized portion unremarkable. Spleen: Size and appearance within normal limits. Right Kidney: Length: 10.8 cm. Echogenicity within normal limits. No mass or hydronephrosis visualized. Left Kidney: Length: 11.5 cm. Echogenicity within normal limits. No mass or hydronephrosis visualized. Abdominal aorta: No aneurysm visualized. Other findings: Moderate amount of ascites. IMPRESSION: Hepatic cirrhosis and moderate ascites. No definite hepatic mass visualized. Prior cholecystectomy.  No evidence of biliary ductal dilatation. Electronically Signed   By: Earle Gell M.D.   On: 03/23/2019 22:06   US Liver Doppler  Result Date: 03/10/2019 CLINICAL DATA:  Cirrhosis, ascites EXAM: DUPLEX ULTRASOUND OF LIVER TECHNIQUE: Color and duplex Doppler ultrasound was performed to evaluate the hepatic in-flow and out-flow vessels. COMPARISON:  CT 07/15/2017 FINDINGS: Portal Vein: No occlusion or thrombus. 1.1 cm diameter. Velocities (all hepatopetal): Main:  17-32 cm/sec Right:  13 cm/sec Left:  14 cm/sec Hepatic Vein Velocities (all hepatofugal): Right:  21 cm/sec Middle:  23 cm/sec Left:  20 cm/sec IVC (intrahepatic): 11 cm/seconds Hepatic Artery Velocity:  35 cm/sec Spleen 8.6 x 4.2 x 4.9 cm (volume = 93 cm^3). Splenic Vein: No occlusion or  thrombus. Velocity: 18 cm/sec Varices: None identified Ascites: Present IMPRESSION: 1. Unremarkable hepatic vascular Doppler evaluation. 2. Persistent abdominal ascites. Electronically Signed   By: Lucrezia Europe M.D.   On: 03/03/2019 07:59   Vas [place] Upper Extremity Venous Duplex  Result Date: 03/23/2019 UPPER VENOUS STUDY  Indications: Swelling Performing Technologist: Oliver Hum RVT  Examination Guidelines: A complete evaluation includes B-mode imaging, spectral Doppler, color Doppler, and power Doppler as needed of all accessible portions of each vessel. Bilateral testing is considered an integral part of a complete examination. Limited examinations for reoccurring indications may be performed as noted.  Right Findings: +----------+------------+---------+-----------+----------+-------+ RIGHT     CompressiblePhasicitySpontaneousPropertiesSummary +----------+------------+---------+-----------+----------+-------+ IJV           Full       Yes       Yes                      +----------+------------+---------+-----------+----------+-------+ Subclavian    Full       Yes       Yes                      +----------+------------+---------+-----------+----------+-------+ Axillary      Full       Yes       Yes                      +----------+------------+---------+-----------+----------+-------+ Brachial      Full       Yes       Yes                      +----------+------------+---------+-----------+----------+-------+ Radial        Full                                          +----------+------------+---------+-----------+----------+-------+  Ulnar         Full                                          +----------+------------+---------+-----------+----------+-------+ Cephalic      None                                  Chronic +----------+------------+---------+-----------+----------+-------+ Basilic       Full                                           +----------+------------+---------+-----------+----------+-------+  Left Findings: +----------+------------+---------+-----------+----------+-------+ LEFT      CompressiblePhasicitySpontaneousPropertiesSummary +----------+------------+---------+-----------+----------+-------+ Subclavian    Full       Yes       Yes                      +----------+------------+---------+-----------+----------+-------+  Summary:  Right: No evidence of deep vein thrombosis in the upper extremity. 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  Left: No evidence of thrombosis in the subclavian.  *See table(s) above for measurements and observations.  Diagnosing physician: Harold Barban MD Electronically signed by Harold Barban MD on 03/23/2019 at 5:11:50 PM.    Final    Recent Labs    02/26/2019 0415  WBC 6.3  HGB 7.1*  HCT 19.0*  PLT 62*   Recent Labs    03/23/19 0937 03/17/2019 0415  NA 133* 134*  K 3.2* 3.4*  CL 104 103  CO2 20* 20*  GLUCOSE 189* 173*  BUN 78* 79*  CREATININE 2.56* 2.75*  CALCIUM 8.2* 8.6*    Intake/Output Summary (Last 24 hours) at 02/25/2019 0806 Last data filed at 03/01/2019 0527 Gross per 24 hour  Intake 750 ml  Output 450 ml  Net 300 ml     Physical Exam: Vital Signs Blood pressure (!) 117/50, pulse 92, temperature 97.6 F (36.4 C), resp. rate 18, height 5' 3"  (1.6 m), weight 118.8 kg, SpO2 99 %.   General: No acute distress Mood and affect are appropriate Heart: Regular rate and rhythm no rubs murmurs or extra sounds Lungs: Clear to auscultation, breathing unlabored, no rales or wheezes Abdomen: Positive bowel sounds, soft nontender to palpation, nondistended Extremities: No clubbing, cyanosis,, 1+ edema bilateral pretibial  Skin: No evidence of breakdown, no evidence of rash Neurologic: Cranial nerves II through XII intact, motor strength is 4/5 in bilateral deltoid, bicep, tricep, grip, hip flexor, knee  extensors, ankle dorsiflexor and plantar flexor Sensory exam normal sensation to light touch and proprioception in bilateral upper and lower extremities Cerebellar exam normal finger to nose to finger as well as heel to shin in bilateral upper and lower extremities Musculoskeletal: Full range of motion in all 4 extremities. No joint swelling   Assessment/Plan: 1. Functional deficits secondary to MCA infarct and mobility which require 3+ hours per day of interdisciplinary therapy in a comprehensive inpatient rehab setting.  Physiatrist is providing close team supervision and 24 hour management of active medical problems listed below.  Physiatrist and rehab team continue to assess barriers to discharge/monitor patient progress toward functional and medical goals  Care Tool:  Bathing    Body parts bathed by  patient: Right arm, Left arm, Chest, Abdomen, Face, Front perineal area   Body parts bathed by helper: Buttocks Body parts n/a: Left lower leg, Right lower leg, Left upper leg, Right upper leg   Bathing assist Assist Level: Minimal Assistance - Patient > 75%     Upper Body Dressing/Undressing Upper body dressing   What is the patient wearing?: Hospital gown only    Upper body assist Assist Level: Set up assist    Lower Body Dressing/Undressing Lower body dressing      What is the patient wearing?: Underwear/pull up, Pants     Lower body assist Assist for lower body dressing: Moderate Assistance - Patient 50 - 74%     Toileting Toileting    Toileting assist Assist for toileting: Maximal Assistance - Patient 25 - 49%     Transfers Chair/bed transfer  Transfers assist     Chair/bed transfer assist level: Contact Guard/Touching assist     Locomotion Ambulation   Ambulation assist      Assist level: Contact Guard/Touching assist Assistive device: Walker-rolling Max distance: 52f   Walk 10 feet activity   Assist     Assist level: Contact  Guard/Touching assist Assistive device: Walker-rolling   Walk 50 feet activity   Assist Walk 50 feet with 2 turns activity did not occur: Safety/medical concerns  Assist level: Contact Guard/Touching assist Assistive device: Walker-rolling    Walk 150 feet activity   Assist Walk 150 feet activity did not occur: Safety/medical concerns         Walk 10 feet on uneven surface  activity   Assist     Assist level: Minimal Assistance - Patient > 75%(up/down ramp) Assistive device: WAeronautical engineerWill patient use wheelchair at discharge?: No(anticipate pt will not use w/c at discharge)             Wheelchair 50 feet with 2 turns activity    Assist            Wheelchair 150 feet activity     Assist          Medical Problem List and Plan: 1.Right side weakness and slurred speechsecondary to left MCA infarction status post IR left ICA near occlusive thrombus/revascularization as well as history of recent subdural hematoma January 2020..Marland KitchenNo anti-thrombotic for now given recent SDH and elevated INR per neurology services.  Stroke is not a significant contributor to her current fxnl impairment GI /Nephro to advise pt would benefit from acute care setting to focus on her AKI and Hepatorenal syndrome  Would anticipate return to rehab if pt stabilizes Consult IM hospitalist service   CIR PT, OT SLP , Team conf today2. Antithrombotics:low PLT no heparin -DVT/anticoagulation:Venous Dopplers negative SCDs,NO  RUE acute thrombus ,  requiring PICC for IV albumin-antiplatelet therapy: N/A 3. Pain Management:Tylenol as needed 4. Mood:Provide emotional support -antipsychotic agents: N/A 5. Neuropsych: This patientiscapable of making decisions on herown behalf. 6. Skin/Wound Care:Routine skin checks 7. Fluids/Electrolytes/Nutrition:Routine in the mouth with follow-up chemistries 8. Acute  respiratory failure after mechanical thrombectomy. Extubated 03/15/2019 9. Uncompensated cirrhosis.  Chronulac.on hold monitor serum ammonia mildly elevated but stable vs values while on lactulose Discussed with GI pt was intolerant of lactulose  10twice daily.Latest ammonia level normal 10. Hypertension.Lasix 40 mg daily, Aldactone 12.5 mg daily have been resumed.Monitor with increased mobility.. Vitals:   03/23/19 2300 03/18/2019 0513  BP:  (!) 117/50  Pulse: 91 92  Resp:  18  Temp:  97.6 F (36.4 C)  SpO2: 100% 99%  controlled 4/29-check orthostatics 11. Diabetes mellitus. SSI. Hemoglobin A1c 6.2.NovoLog 5 units 3 times a day. Patient on Glucotrol 10 mg twice a day, Glucophage 500 mg twice a day, Victoza 1.8 mg daily at bedtime prior to admission. Resume as needed Elevated restart glucotrol 2.72m qam , no metformin CBG (last 3)  Recent Labs    03/23/19 1746 03/23/19 2305 02/26/2019 0632  GLUCAP 152* 176* 164*  12. Hyperlipidemia. No statin at this time due to cirrhosis 13. Klebsiella and enterococcus UTI. Continue Augmentin through 03/22/2019 and stop 14. CKD stage III. Has not been at baseline since hospitalization in February 2020,4/24 values in line with recent.  Has had increased diuretics to mobilize ascites fluid and avoid repeat paracentesis.   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  Abdominal distention in a patient with NKarlene Linemanand cirrhosis.  She has no nausea or vomiting.  She has had multiple stools. Improved off lactulose. KUB without ileus  Will need to monitor serum ammonia Consult GI -appreciated, seen by GI attending yesterday, Liver UKoreashowed no portal v thrombosis   16.  Edema likely related to low alb  on diuretic weights are going up  [facility]   03/22/19 0510 03/23/19 0333 03/18/2019 0513  Weight: 116 kg 116.7 kg 118.8 kg  fluid/electrolyte management per nephro + GI LOS: 6  days A FACE TO FACE EVALUATION WAS PERFORMED  ALuanna SalkKirsteins 03/10/2019, 8:06 AM

## 2019-03-24 NOTE — Progress Notes (Signed)
Social Work Patient ID: Monica Pearson, female   DOB: [birth year], 73 y.o.   MRN: [id number] Pt has medical issues and is being transferred to acute today. Pt would rather not come back to rehab and go home from acute. Will have AC follow on acute.

## 2019-03-24 NOTE — Progress Notes (Addendum)
Progress Note    ASSESSMENT AND PLAN:   50.  73 year old patient with decompensated [name] cirrhosis in setting of recent CVA s/p revascularization of left ICA / acute respiratory failure/ UTI. MELD is 25. INR remaining stable at 1.6. She has AKI / CKD with low urine output poorly responsive to diuretics. ( ? HRS)  -Worsening of Creatinine overnight 2.56 >>> 2.75. Getting ProAmatine, albumin and Sandostatin. Patient being transferred from inpatient rehab to inpatient given need for escalation in care.  -Diagnostic paracentesis pending. I spoke with IR and they have her on schedule today. Need to rule our SBP as etiology for decompensation. Abdominal US negative for liver masses and liver doppler study normal.  -AFP 2.6 -intolerant to Lactulose due to hx of IBS. Currently not encephalopathy but will resume home Xifaxan since she is at increased risk for becoming encephalopathic.  -Son Dorita Fray wanted update from GI. I spoke with him over phone this am.     SUBJECTIVE    Just finished ambulating in hall with PT. She is SOB.     OBJECTIVE:     Urine output yesterday 450 ml Vital signs in last 24 hours: Temp:  [97.5 F (36.4 C)-97.8 F (36.6 C)] 97.6 F (36.4 C) (04/29 0513) Pulse Rate:  [86-107] 92 (04/29 0513) Resp:  [18-20] 18 (04/29 0513) BP: (110-129)/(50-69) 117/50 (04/29 0513) SpO2:  [83 %-100 %] 99 % (04/29 0513) Weight:  [161.0 kg] 118.8 kg (04/29 0513) Last BM Date: 03/23/19 General:   Alert, female in NAD EENT:  Normal hearing  Heart:  Regular rate and rhythm; + murmur.  Anasarca   Pulm: Labored breathing post ambulation. Wheezing in anterior neck but lungs CTA bilaterally . Abdomen:  Soft, nontender. The upper abdominal fullness  is slightly better compared to yesterday but still present ( ? stool in colon).  Normal bowel sounds,.       Neurologic:  Alert and  oriented x4;  grossly normal neurologically. Psych:  Pleasant, cooperative.  Normal mood and affect.    Intake/Output from previous day: 04/28 0701 - 04/29 0700 In: 1050 [P.O.:540; I.V.:10; IV Piggyback:500] Out: 450 [Urine:450] Intake/Output this shift: No intake/output data recorded.  Lab Results: Recent Labs    02/24/2019 0415  WBC 6.3  HGB 7.1*  HCT 19.0*  PLT 62*   BMET Recent Labs    03/23/19 0937 03/12/2019 0415  NA 133* 134*  K 3.2* 3.4*  CL 104 103  CO2 20* 20*  GLUCOSE 189* 173*  BUN 78* 79*  CREATININE 2.56* 2.75*  CALCIUM 8.2* 8.6*   LFT Recent Labs    03/23/19 1730 02/25/2019 0415  PROT 5.7* 5.2*  ALBUMIN 2.3* 2.4*  AST 70* 58*  ALT 62* 52*  ALKPHOS 232* 221*  BILITOT 3.0* 2.7*  BILIDIR 1.1*  --   IBILI 1.9*  --    PT/INR Recent Labs    03/23/19 0404 03/11/2019 0415  LABPROT 19.2* 19.1*  INR 1.6* 1.6*   Hepatitis Panel No results for input(s): HEPBSAG, HCVAB, HEPAIGM, HEPBIGM in the last 72 hours.  US Abdomen Complete  Result Date: 03/23/2019 CLINICAL DATA:  Cirrhosis.  Ascites. EXAM: ABDOMEN ULTRASOUND COMPLETE COMPARISON:  None. FINDINGS: Gallbladder: Surgically absent. Common bile duct: Diameter: 4 mm, within normal limits. Liver: Advanced hepatic cirrhosis seen. No liver masses are visualized. Portal vein is patent on color Doppler imaging with normal direction of blood flow towards the liver. IVC: No abnormality visualized. Pancreas: Visualized portion unremarkable. Spleen: Size and appearance  within normal limits. Right Kidney: Length: 10.8 cm. Echogenicity within normal limits. No mass or hydronephrosis visualized. Left Kidney: Length: 11.5 cm. Echogenicity within normal limits. No mass or hydronephrosis visualized. Abdominal aorta: No aneurysm visualized. Other findings: Moderate amount of ascites. IMPRESSION: Hepatic cirrhosis and moderate ascites. No definite hepatic mass visualized. Prior cholecystectomy.  No evidence of biliary ductal dilatation. Electronically Signed   By: Earle Gell M.D.   On: 03/23/2019 22:06   US Liver Doppler  Result  Date: 03/25/2019 CLINICAL DATA:  Cirrhosis, ascites EXAM: DUPLEX ULTRASOUND OF LIVER TECHNIQUE: Color and duplex Doppler ultrasound was performed to evaluate the hepatic in-flow and out-flow vessels. COMPARISON:  CT 07/15/2017 FINDINGS: Portal Vein: No occlusion or thrombus. 1.1 cm diameter. Velocities (all hepatopetal): Main:  17-32 cm/sec Right:  13 cm/sec Left:  14 cm/sec Hepatic Vein Velocities (all hepatofugal): Right:  21 cm/sec Middle:  23 cm/sec Left:  20 cm/sec IVC (intrahepatic): 11 cm/seconds Hepatic Artery Velocity:  35 cm/sec Spleen 8.6 x 4.2 x 4.9 cm (volume = 93 cm^3). Splenic Vein: No occlusion or thrombus. Velocity: 18 cm/sec Varices: None identified Ascites: Present IMPRESSION: 1. Unremarkable hepatic vascular Doppler evaluation. 2. Persistent abdominal ascites. Electronically Signed   By: Lucrezia Europe M.D.   On: 03/06/2019 07:59   Vas [place] Upper Extremity Venous Duplex  Result Date: 03/23/2019 UPPER VENOUS STUDY  Indications: Swelling Performing Technologist: Oliver Hum RVT  Examination Guidelines: A complete evaluation includes B-mode imaging, spectral Doppler, color Doppler, and power Doppler as needed of all accessible portions of each vessel. Bilateral testing is considered an integral part of a complete examination. Limited examinations for reoccurring indications may be performed as noted.  Right Findings: +----------+------------+---------+-----------+----------+-------+ RIGHT     CompressiblePhasicitySpontaneousPropertiesSummary +----------+------------+---------+-----------+----------+-------+ IJV           Full       Yes       Yes                      +----------+------------+---------+-----------+----------+-------+ Subclavian    Full       Yes       Yes                      +----------+------------+---------+-----------+----------+-------+ Axillary      Full       Yes       Yes                       +----------+------------+---------+-----------+----------+-------+ Brachial      Full       Yes       Yes                      +----------+------------+---------+-----------+----------+-------+ Radial        Full                                          +----------+------------+---------+-----------+----------+-------+ Ulnar         Full                                          +----------+------------+---------+-----------+----------+-------+ Cephalic      None  Chronic +----------+------------+---------+-----------+----------+-------+ Basilic       Full                                          +----------+------------+---------+-----------+----------+-------+  Left Findings: +----------+------------+---------+-----------+----------+-------+ LEFT      CompressiblePhasicitySpontaneousPropertiesSummary +----------+------------+---------+-----------+----------+-------+ Subclavian    Full       Yes       Yes                      +----------+------------+---------+-----------+----------+-------+  Summary:  Right: No evidence of deep vein thrombosis in the upper extremity. 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  Left: No evidence of thrombosis in the subclavian.  *See table(s) above for measurements and observations.  Diagnosing physician: Harold Barban MD Electronically signed by Harold Barban MD on 03/23/2019 at 5:11:50 PM.    Final    Active Problems:   Left middle cerebral artery stroke ([place])     LOS: 6 days   Tye Savoy ,NP 03/20/2019, 11:18 AM    Attending physician's note   I have taken an interval history, reviewed the chart and examined the patient. I agree with the Advanced Practitioner's note, impression and recommendations.   No evidence of SBP diagnostic paracentesis AFP normal No liver lesions  Creatinine slightly worse, being managed by Dr. Joelyn Oms  for possible HRS  Moderate amount of stool in transverse and proximal colon, consider bowel purge with Miralax once renal function stable  Restarted Rifaximin for hepatic encephalopathy   Raliegh Ip Denzil Magnuson , MD [phone]

## 2019-03-24 NOTE — Discharge Summary (Signed)
Physician Discharge Summary  Patient ID: Monica Pearson MRN: [id number] DOB/AGE: 1946/03/05 73 y.o.  Admit date: 03/18/2019 Discharge date: 03/20/2019  Discharge Diagnoses:  Active Problems:   Left middle cerebral artery stroke (HCC) DVT prophylaxis Acute respiratory failure with mechanical thrombectomy Uncompensated cirrhosis Hypertension Diabetes mellitus Hyperlipidemia Klebsiella and enterococcus UTI Chronic kidney disease stage III  Discharged Condition: Guarded  Significant Diagnostic Studies: Ct Angio Head W Or Wo Contrast  Result Date: 03/14/2019 CLINICAL DATA:  Right-sided deficits EXAM: CT ANGIOGRAPHY HEAD AND NECK TECHNIQUE: Multidetector CT imaging of the head and neck was performed using the standard protocol during bolus administration of intravenous contrast. Multiplanar CT image reconstructions and MIPs were obtained to evaluate the vascular anatomy. Carotid stenosis measurements (when applicable) are obtained utilizing NASCET criteria, using the distal internal carotid diameter as the denominator. CONTRAST:  106m OMNIPAQUE IOHEXOL 350 MG/ML SOLN COMPARISON:  Head CT 03/14/2019 FINDINGS: CTA NECK FINDINGS SKELETON: There is no bony spinal canal stenosis. No lytic or blastic lesion. OTHER NECK: Normal pharynx, larynx and major salivary glands. No cervical lymphadenopathy. Unremarkable thyroid gland. UPPER CHEST: No pneumothorax or pleural effusion. No nodules or masses. AORTIC ARCH: There is no calcific atherosclerosis of the aortic arch. There is no aneurysm, dissection or hemodynamically significant stenosis of the visualized ascending aorta and aortic arch. Conventional 3 vessel aortic branching pattern. The visualized proximal subclavian arteries are widely patent. RIGHT CAROTID SYSTEM: --Common carotid artery: Widely patent origin without common carotid artery dissection or aneurysm. --Internal carotid artery: Normal without aneurysm, dissection or stenosis. --External carotid  artery: No acute abnormality. LEFT CAROTID SYSTEM: --Common carotid artery: Widely patent origin without common carotid artery dissection or aneurysm. --Internal carotid artery: Qualitative narrowing of the proximal ICA, which otherwise remains patent to the skull base. There is low-attenuation thrombus within the proximal left ICA causing severe --External carotid artery: No acute abnormality. VERTEBRAL ARTERIES: Left dominant configuration. Both origins are normal. No dissection, occlusion or flow-limiting stenosis to the vertebrobasilar confluence. CTA HEAD FINDINGS POSTERIOR CIRCULATION: --Vertebral arteries: Normal codominant configuration of V4 segments. --Posterior inferior cerebellar arteries (PICA): Patent origins from the vertebral arteries. --Anterior inferior cerebellar arteries (AICA): The right AICA originates normally from the basilar artery. Left AICA not clearly visualized. --Basilar artery: Normal. --Superior cerebellar arteries: Normal. --Posterior cerebral arteries (PCA): Normal. Both originate from the basilar artery. Posterior communicating arteries (p-comm) are diminutive . ANTERIOR CIRCULATION: --Intracranial internal carotid arteries: Normal. --Anterior cerebral arteries (ACA): Normal. Both A1 segments are present. Patent anterior communicating artery (a-comm). --Middle cerebral arteries (MCA): Normal. VENOUS SINUSES: As permitted by contrast timing, patent. ANATOMIC VARIANTS: None Review of the MIP images confirms the above findings. IMPRESSION: 1. 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 This causes severe qualitative narrowing of proximal ICA. This be a source for small downstream emboli. 2. No intracranial occlusion or stenosis. 3. These results were called by telephone at the time of interpretation on 03/14/2019 at 8:23 pm to Dr. EKerney Elbe, who verbally acknowledged these results. Electronically Signed   By: KUlyses JarredM.D.   On: 03/14/2019 20:23   Dg Chest 2 View  Result Date: 03/02/2019 CLINICAL DATA:  Shortness of breath EXAM: CHEST - 2 VIEW COMPARISON:  01/13/2019 FINDINGS: Low lung volumes. Peribronchial thickening and bibasilar opacities, likely atelectasis. Heart is normal size. No effusions. No acute bony abnormality. IMPRESSION: Bronchitic changes.  Low volumes with bibasilar atelectasis. Electronically Signed   By: KRolm BaptiseM.D.  On: 03/02/2019 10:57   Dg Abd 1 View  Result Date: 03/18/2019 CLINICAL DATA:  Abdominal distention. EXAM: ABDOMEN - 1 VIEW COMPARISON:  None. FINDINGS: Two separate radiographs were obtained to include the entire body habitus. The bowel gas pattern is normal. No radio-opaque calculi or other significant radiographic abnormality are seen. IMPRESSION: Negative. Electronically Signed   By: Staci Righter M.D.   On: 03/18/2019 19:16   Ct Angio Neck W Or Wo Contrast  Result Date: 03/14/2019 CLINICAL DATA:  Right-sided deficits EXAM: CT ANGIOGRAPHY HEAD AND NECK TECHNIQUE: Multidetector CT imaging of the head and neck was performed using the standard protocol during bolus administration of intravenous contrast. Multiplanar CT image reconstructions and MIPs were obtained to evaluate the vascular anatomy. Carotid stenosis measurements (when applicable) are obtained utilizing NASCET criteria, using the distal internal carotid diameter as the denominator. CONTRAST:  63m OMNIPAQUE IOHEXOL 350 MG/ML SOLN COMPARISON:  Head CT 03/14/2019 FINDINGS: CTA NECK FINDINGS SKELETON: There is no bony spinal canal stenosis. No lytic or blastic lesion. OTHER NECK: Normal pharynx, larynx and major salivary glands. No cervical lymphadenopathy. Unremarkable thyroid gland. UPPER CHEST: No pneumothorax or pleural effusion. No nodules or masses. AORTIC ARCH: There is no calcific atherosclerosis of the aortic arch. There is no aneurysm, dissection or hemodynamically significant stenosis of the visualized  ascending aorta and aortic arch. Conventional 3 vessel aortic branching pattern. The visualized proximal subclavian arteries are widely patent. RIGHT CAROTID SYSTEM: --Common carotid artery: Widely patent origin without common carotid artery dissection or aneurysm. --Internal carotid artery: Normal without aneurysm, dissection or stenosis. --External carotid artery: No acute abnormality. LEFT CAROTID SYSTEM: --Common carotid artery: Widely patent origin without common carotid artery dissection or aneurysm. --Internal carotid artery: Qualitative narrowing of the proximal ICA, which otherwise remains patent to the skull base. There is low-attenuation thrombus within the proximal left ICA causing severe --External carotid artery: No acute abnormality. VERTEBRAL ARTERIES: Left dominant configuration. Both origins are normal. No dissection, occlusion or flow-limiting stenosis to the vertebrobasilar confluence. CTA HEAD FINDINGS POSTERIOR CIRCULATION: --Vertebral arteries: Normal codominant configuration of V4 segments. --Posterior inferior cerebellar arteries (PICA): Patent origins from the vertebral arteries. --Anterior inferior cerebellar arteries (AICA): The right AICA originates normally from the basilar artery. Left AICA not clearly visualized. --Basilar artery: Normal. --Superior cerebellar arteries: Normal. --Posterior cerebral arteries (PCA): Normal. Both originate from the basilar artery. Posterior communicating arteries (p-comm) are diminutive . ANTERIOR CIRCULATION: --Intracranial internal carotid arteries: Normal. --Anterior cerebral arteries (ACA): Normal. Both A1 segments are present. Patent anterior communicating artery (a-comm). --Middle cerebral arteries (MCA): Normal. VENOUS SINUSES: As permitted by contrast timing, patent. ANATOMIC VARIANTS: None Review of the MIP images confirms the above findings. IMPRESSION: 1. 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 This causes severe qualitative narrowing of proximal ICA. This be a source for small downstream emboli. 2. No intracranial occlusion or stenosis. 3. These results were called by telephone at the time of interpretation on 03/14/2019 at 8:23 pm to Dr. EKerney Elbe, who verbally acknowledged these results. Electronically Signed   By: KUlyses JarredM.D.   On: 03/14/2019 20:23   Mr Brain Wo Contrast  Result Date: 03/15/2019 CLINICAL DATA:  Slurred speech with RIGHT-sided weakness and RIGHT gaze. Status post clot retrieval from the LEFT ICA. EXAM: MRI HEAD WITHOUT CONTRAST TECHNIQUE: Multiplanar, multiecho pulse sequences of the brain and surrounding structures were obtained without intravenous contrast. COMPARISON:  CTA head neck 03/14/2019. Catheter  angiogram 03/14/2019. FINDINGS: The patient was unable to remain motionless for the exam. Small or subtle lesions could be overlooked. Brain: Small foci of acute infarction are seen in the LEFT hemisphere, MCA territory affecting the LEFT frontal and LEFT posterior frontal cortex and regional white matter. The largest area is located in the precentral gyrus, roughly 1 cm in size. No visible associated hemorrhage. Additional areas of restricted diffusion in the RIGHT subdural space, most notably over the frontal lobe, represent the previously reported subdural hematoma. No significant mass effect from these collections, which measures only a few mm in thickness, maximal 5 mm over the RIGHT inferior frontal lobe. Elsewhere, generalized atrophy. Mild to moderate subcortical and periventricular T2 and FLAIR hyperintensities, likely chronic microvascular ischemic change. Vascular: Normal flow voids. Skull and upper cervical spine: Motion degraded, grossly negative. Sinuses/Orbits: Negative. Other: None. IMPRESSION: Small foci of acute infarction, LEFT MCA territory affecting the frontal and posterior frontal cortex and white matter. See discussion above.  No associated hemorrhage. Small volume subdural hematoma over the RIGHT convexity, non worrisome. Atrophy and small vessel disease. Flow voids demonstrate patency of the LEFT carotid system status post clot retrieval. Electronically Signed   By: Staci Righter M.D.   On: 03/15/2019 17:08   US Abdomen Complete  Result Date: 03/23/2019 CLINICAL DATA:  Cirrhosis.  Ascites. EXAM: ABDOMEN ULTRASOUND COMPLETE COMPARISON:  None. FINDINGS: Gallbladder: Surgically absent. Common bile duct: Diameter: 4 mm, within normal limits. Liver: Advanced hepatic cirrhosis seen. No liver masses are visualized. Portal vein is patent on color Doppler imaging with normal direction of blood flow towards the liver. IVC: No abnormality visualized. Pancreas: Visualized portion unremarkable. Spleen: Size and appearance within normal limits. Right Kidney: Length: 10.8 cm. Echogenicity within normal limits. No mass or hydronephrosis visualized. Left Kidney: Length: 11.5 cm. Echogenicity within normal limits. No mass or hydronephrosis visualized. Abdominal aorta: No aneurysm visualized. Other findings: Moderate amount of ascites. IMPRESSION: Hepatic cirrhosis and moderate ascites. No definite hepatic mass visualized. Prior cholecystectomy.  No evidence of biliary ductal dilatation. Electronically Signed   By: Earle Gell M.D.   On: 03/23/2019 22:06   [place]  Result Date: 03/19/2019 INDICATION: Left-sided gaze preference with right-sided weakness and slurred speech mostly expressive aphasia. 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 EXAM: 1. EMERGENT LARGE VESSEL OCCLUSION THROMBOLYSIS anterior CIRCULATION) COMPARISON:  CT angiogram of the head and neck 03/14/2019. MEDICATIONS: 1 g vancomycin IV antibiotic was administered within 1 hour of the procedure. ANESTHESIA/SEDATION: General anesthesia. CONTRAST:  Isovue 300 approximately 60 mL. FLUOROSCOPY TIME:  Fluoroscopy Time: 20 minutes 30 seconds  (1579 mGy). COMPLICATIONS: None immediate. TECHNIQUE: Following a full explanation of the procedure along with the potential associated complications, an informed witnessed consent was obtained from the patient's son. The risks of intracranial hemorrhage of 10%, worsening neurological deficit, ventilator dependency, death and inability to revascularize were all reviewed in detail with the patient's son. The patient was then put under general anesthesia by the Department of Anesthesiology at [facility]. The right groin was prepped and draped in the usual sterile fashion. Thereafter using modified Seldinger technique, transfemoral access into the right common femoral artery was obtained without difficulty. Over a 0.035 inch guidewire a 5 French Pinnacle sheath was inserted. Through this, and also over a 0.035 inch guidewire a 5 [place] JB 1 catheter was advanced to the aortic arch region and selectively positioned in the left common carotid artery. FINDINGS: The left common carotid  arteriogram demonstrates the left external carotid artery and its major branches to be widely patent. The left internal carotid artery at the bulb and the proximal 1/3 demonstrates a large smooth filling defect with near complete occlusion. Contrast is seen to extend slowly distally into the left internal carotid artery to the skull base. The petrous, cavernous and supraclinoid segments are widely patent except for a mild focal stenosis of the distal cavernous segment just proximal to the origin of the ophthalmic artery. The left middle cerebral artery and the left anterior cerebral artery opacify into the capillary and venous phases. PROCEDURE: The diagnostic JB 1 catheter in the left common carotid artery was exchanged over a 0.035 inch 300 cm Rosen exchange guidewire for an 8 French 55 cm Brite tip neurovascular sheath. Good aspiration was obtained from the hub of the neurovascular sheath. This was then connected to continuous  heparinized saline infusion. Over the [employer] guidewire, an 8 [place] 85 cm FlowGate balloon guide catheter which had been prepped with 50% contrast and 50% heparinized saline infusion was advanced and positioned just proximal to the left common carotid bifurcation. The guidewire was removed. A gentle control arteriogram demonstrated no evidence of spasms, dissections or of intraluminal filling defects. No change was seen in the large nearly obstructing filling defect in the left internal carotid artery proximally. Over a 0.014 inch Softip Synchro micro guidewire, an 021 cm Trevo ProVue 150 cm microcatheter was then advanced to the distal end of the [facility] guide catheter. With the micro guidewire leading with a J-tip configuration, using a torque device, unimpeded access through the left internal carotid artery proximally was obtained. The micro guidewire with the microcatheter was then advanced to the horizontal petrous segment of the left internal carotid artery. The guidewire was removed. Good aspiration was obtained from the hub of the microcatheter. This was then connected to continuous heparinized saline infusion. A 4 mm x 40 mm X Solitaire retrieval device was then advanced to the distal end of the microcatheter. With this slight forward gentle traction with the right hand on the delivery micro guidewire, with the left hand the delivery microcatheter was retrieved unsheathing the retrieval device in the distal vertical segment of the left internal carotid artery. At this time, proximal flow arrest was then initiated by inflating the balloon of the [facility] guide catheter at the origin of the left internal carotid artery. With constant aspiration being applied with a Penumbra aspiration device at the hub of the [facility] guide catheter, the combination of the retrieval device, and the microcatheter were retrieved and removed. Aspiration was continued as the balloon was deflated in the left common  carotid artery at the level of the internal carotid artery origin. Free aspiration of blood was noted at the hub of the [facility] guide catheter. A control arteriogram performed through the [facility] guide catheter in the left common carotid artery at the origin of the left internal carotid artery demonstrated complete clearance of the previously noted large sausage-shaped nearly occlusive filling defect in the left internal carotid artery. No evidence of an underlying stenosis or dissection was seen. More distally, the left middle cerebral artery and the left anterior cerebral artery demonstrated no change in the complete left MCA and left anterior cerebral artery distribution. Throughout the procedure, the patient's blood pressure and neurological status remained stable. Right after the procedure, the patient was given 180 mg of Brilinta via an orogastric tube with intent to perform standing had there been an underlying severe stenosis.  The 8 [place] FlowGate guide catheter and the 8 [place] sheath were retrieved into the abdominal aorta and exchanged over a J-tip guidewire for an 8 [place] Pinnacle sheath. This in turn was then removed with the successful application of an 8 [place] Angio-Seal closure device. The right groin appeared soft without evidence of a hematoma or bleeding. Distal pulses remained Dopplerable in the DPs and PTs bilaterally unchanged from prior to the procedure. An immediate postprocedural CT scan of the brain demonstrated no evidence of a hemorrhage, or mass effect or midline shift. The patient was then transferred to the neuro ICU for further post thrombectomy management. IMPRESSION: Status post endovascular complete revascularization of symptomatic near occlusive left internal carotid artery proximally with 1 pass of 4 mm x 40 mm Solitaire FR retrieval device with proximal flow arrest and Penumbra aspiration. No underlying stenosis of the internal carotid artery was identified. Distal  intracranial TICI 3 revascularization was maintained. PLAN: Follow-up in the clinic 4 weeks post discharge. Electronically Signed   By: Luanne Bras M.D.   On: 03/15/2019 09:31   Dg Chest Port 1 View  Result Date: 03/17/2019 CLINICAL DATA:  Occluded PICC line. EXAM: PORTABLE CHEST 1 VIEW COMPARISON:  Chest x-ray dated March 15, 2019. FINDINGS: Interval placement of a right upper extremity PICC line with the tip at the cavoatrial junction. Interval removal of the endotracheal and enteric tubes. Stable cardiomediastinal silhouette. Normal pulmonary vascularity. Persistent low lung volumes with mild bibasilar atelectasis. No focal consolidation, pleural effusion, or pneumothorax. No acute osseous abnormality. IMPRESSION: 1. Persistent low lung volumes with mild bibasilar atelectasis. Electronically Signed   By: Titus Dubin M.D.   On: 03/17/2019 11:52   Dg Chest Port 1 View  Result Date: 03/15/2019 CLINICAL DATA:  Check endotracheal tube placement EXAM: PORTABLE CHEST 1 VIEW COMPARISON:  03/14/2019 FINDINGS: Cardiac shadow is within normal limits. Endotracheal tube and nasogastric catheter are noted in satisfactory position. The overall inspiratory effort is poor with right basilar atelectasis. No bony abnormality is noted. IMPRESSION: Right basilar atelectasis is seen. Tubes and lines as described. Electronically Signed   By: Inez Catalina M.D.   On: 03/15/2019 08:06   Dg Chest Portable 1 View  Result Date: 03/14/2019 CLINICAL DATA:  Intubation, OG tube placement EXAM: PORTABLE CHEST 1 VIEW COMPARISON:  03/02/2019 FINDINGS: Endotracheal tube is just into the right mainstem bronchus. Recommend retracting approximately 2-3 cm. NG tube tip is in the distal esophagus. Low lung volumes with bibasilar atelectasis. No effusions or acute bony abnormality. IMPRESSION: Right mainstem intubation. Recommend retracting 2-3 cm. NG tube tip in the distal esophagus. Low lung volumes with bibasilar atelectasis.  These results were called by telephone at the time of interpretation on 03/14/2019 at 9:19 pm to Dr. Noemi Chapel , who verbally acknowledged these results. Electronically Signed   By: Rolm Baptise M.D.   On: 03/14/2019 21:22   Dg Abd Portable 1 View  Result Date: 03/14/2019 CLINICAL DATA:  OG tube EXAM: PORTABLE ABDOMEN - 1 VIEW COMPARISON:  Chest x-ray today FINDINGS: OG tube tip is in the distal esophagus, better seen on chest x-ray. Nonobstructive bowel gas pattern. Prior cholecystectomy. IMPRESSION: OG tube tip in the distal esophagus, better seen on chest x-ray. Recommend advancing. Electronically Signed   By: Rolm Baptise M.D.   On: 03/14/2019 21:22   [place] Ascites (abdomen Limited)  Result Date: 03/17/2019 CLINICAL DATA:  73 year old female with cirrhosis.  Query ascites. EXAM: LIMITED ABDOMEN ULTRASOUND FOR ASCITES TECHNIQUE: Limited ultrasound survey  for ascites was performed in all four abdominal quadrants. COMPARISON:  Ultrasound 05/15/2018. FINDINGS: Free fluid is demonstrated in both upper quadrants and the left lower quadrant. There is free fluid in the midline. However, the right lower quadrant is spared. IMPRESSION: Ascites is present except in the right lower quadrant. Volume appears mild to moderate. Electronically Signed   By: Genevie Ann M.D.   On: 03/17/2019 11:35   US Liver Doppler  Result Date: 02/27/2019 CLINICAL DATA:  Cirrhosis, ascites EXAM: DUPLEX ULTRASOUND OF LIVER TECHNIQUE: Color and duplex Doppler ultrasound was performed to evaluate the hepatic in-flow and out-flow vessels. COMPARISON:  CT 07/15/2017 FINDINGS: Portal Vein: No occlusion or thrombus. 1.1 cm diameter. Velocities (all hepatopetal): Main:  17-32 cm/sec Right:  13 cm/sec Left:  14 cm/sec Hepatic Vein Velocities (all hepatofugal): Right:  21 cm/sec Middle:  23 cm/sec Left:  20 cm/sec IVC (intrahepatic): 11 cm/seconds Hepatic Artery Velocity:  35 cm/sec Spleen 8.6 x 4.2 x 4.9 cm (volume = 93 cm^3). Splenic Vein: No  occlusion or thrombus. Velocity: 18 cm/sec Varices: None identified Ascites: Present IMPRESSION: 1. Unremarkable hepatic vascular Doppler evaluation. 2. Persistent abdominal ascites. Electronically Signed   By: Lucrezia Europe M.D.   On: 03/06/2019 07:59   [place] Percutaneous Art Thrombectomy/infusion Intracranial Inc Diag Angio  Result Date: 03/19/2019 INDICATION: Left-sided gaze preference with right-sided weakness and slurred speech mostly expressive aphasia. 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 EXAM: 1. EMERGENT LARGE VESSEL OCCLUSION THROMBOLYSIS anterior CIRCULATION) COMPARISON:  CT angiogram of the head and neck 03/14/2019. MEDICATIONS: 1 g vancomycin IV antibiotic was administered within 1 hour of the procedure. ANESTHESIA/SEDATION: General anesthesia. CONTRAST:  Isovue 300 approximately 60 mL. FLUOROSCOPY TIME:  Fluoroscopy Time: 20 minutes 30 seconds (1579 mGy). COMPLICATIONS: None immediate. TECHNIQUE: Following a full explanation of the procedure along with the potential associated complications, an informed witnessed consent was obtained from the patient's son. The risks of intracranial hemorrhage of 10%, worsening neurological deficit, ventilator dependency, death and inability to revascularize were all reviewed in detail with the patient's son. The patient was then put under general anesthesia by the Department of Anesthesiology at [facility]. The right groin was prepped and draped in the usual sterile fashion. Thereafter using modified Seldinger technique, transfemoral access into the right common femoral artery was obtained without difficulty. Over a 0.035 inch guidewire a 5 French Pinnacle sheath was inserted. Through this, and also over a 0.035 inch guidewire a 5 [place] JB 1 catheter was advanced to the aortic arch region and selectively positioned in the left common carotid artery. FINDINGS: The left common carotid arteriogram demonstrates the left  external carotid artery and its major branches to be widely patent. The left internal carotid artery at the bulb and the proximal 1/3 demonstrates a large smooth filling defect with near complete occlusion. Contrast is seen to extend slowly distally into the left internal carotid artery to the skull base. The petrous, cavernous and supraclinoid segments are widely patent except for a mild focal stenosis of the distal cavernous segment just proximal to the origin of the ophthalmic artery. The left middle cerebral artery and the left anterior cerebral artery opacify into the capillary and venous phases. PROCEDURE: The diagnostic JB 1 catheter in the left common carotid artery was exchanged over a 0.035 inch 300 cm Rosen exchange guidewire for an 8 French 55 cm Brite tip neurovascular sheath. Good aspiration was obtained from the hub of the neurovascular sheath. This was then  connected to continuous heparinized saline infusion. Over the [employer] guidewire, an 8 [place] 85 cm FlowGate balloon guide catheter which had been prepped with 50% contrast and 50% heparinized saline infusion was advanced and positioned just proximal to the left common carotid bifurcation. The guidewire was removed. A gentle control arteriogram demonstrated no evidence of spasms, dissections or of intraluminal filling defects. No change was seen in the large nearly obstructing filling defect in the left internal carotid artery proximally. Over a 0.014 inch Softip Synchro micro guidewire, an 021 cm Trevo ProVue 150 cm microcatheter was then advanced to the distal end of the [facility] guide catheter. With the micro guidewire leading with a J-tip configuration, using a torque device, unimpeded access through the left internal carotid artery proximally was obtained. The micro guidewire with the microcatheter was then advanced to the horizontal petrous segment of the left internal carotid artery. The guidewire was removed. Good aspiration was  obtained from the hub of the microcatheter. This was then connected to continuous heparinized saline infusion. A 4 mm x 40 mm X Solitaire retrieval device was then advanced to the distal end of the microcatheter. With this slight forward gentle traction with the right hand on the delivery micro guidewire, with the left hand the delivery microcatheter was retrieved unsheathing the retrieval device in the distal vertical segment of the left internal carotid artery. At this time, proximal flow arrest was then initiated by inflating the balloon of the [facility] guide catheter at the origin of the left internal carotid artery. With constant aspiration being applied with a Penumbra aspiration device at the hub of the [facility] guide catheter, the combination of the retrieval device, and the microcatheter were retrieved and removed. Aspiration was continued as the balloon was deflated in the left common carotid artery at the level of the internal carotid artery origin. Free aspiration of blood was noted at the hub of the [facility] guide catheter. A control arteriogram performed through the [facility] guide catheter in the left common carotid artery at the origin of the left internal carotid artery demonstrated complete clearance of the previously noted large sausage-shaped nearly occlusive filling defect in the left internal carotid artery. No evidence of an underlying stenosis or dissection was seen. More distally, the left middle cerebral artery and the left anterior cerebral artery demonstrated no change in the complete left MCA and left anterior cerebral artery distribution. Throughout the procedure, the patient's blood pressure and neurological status remained stable. Right after the procedure, the patient was given 180 mg of Brilinta via an orogastric tube with intent to perform standing had there been an underlying severe stenosis. The 8 [place] FlowGate guide catheter and the 8 [place] sheath were retrieved into the  abdominal aorta and exchanged over a J-tip guidewire for an 8 [place] Pinnacle sheath. This in turn was then removed with the successful application of an 8 [place] Angio-Seal closure device. The right groin appeared soft without evidence of a hematoma or bleeding. Distal pulses remained Dopplerable in the DPs and PTs bilaterally unchanged from prior to the procedure. An immediate postprocedural CT scan of the brain demonstrated no evidence of a hemorrhage, or mass effect or midline shift. The patient was then transferred to the neuro ICU for further post thrombectomy management. IMPRESSION: Status post endovascular complete revascularization of symptomatic near occlusive left internal carotid artery proximally with 1 pass of 4 mm x 40 mm Solitaire FR retrieval device with proximal flow arrest and Penumbra aspiration. No underlying stenosis of the  internal carotid artery was identified. Distal intracranial TICI 3 revascularization was maintained. PLAN: Follow-up in the clinic 4 weeks post discharge. Electronically Signed   By: Luanne Bras M.D.   On: 03/15/2019 09:31   Ct Head Code Stroke Wo Contrast  Result Date: 03/14/2019 CLINICAL DATA:  Code stroke. Slurred speech and right-sided deficits EXAM: CT HEAD WITHOUT CONTRAST TECHNIQUE: Contiguous axial images were obtained from the base of the skull through the vertex without intravenous contrast. COMPARISON:  Head CT 02/11/2019 FINDINGS: Brain: Minimal residual right convexity subdural blood, unchanged compared to 02/11/2019. No acute hemorrhage. The size and configuration of the ventricles and extra-axial CSF spaces are normal. There is hypoattenuation of the periventricular white matter, most commonly indicating chronic ischemic microangiopathy. Vascular: No abnormal hyperdensity of the major intracranial arteries or dural venous sinuses. No intracranial atherosclerosis. Skull: The visualized skull base, calvarium and extracranial soft tissues are normal.  Sinuses/Orbits: No fluid levels or advanced mucosal thickening of the visualized paranasal sinuses. No mastoid or middle ear effusion. The orbits are normal. ASPECTS [facility] Stroke Program Early CT Score) - Ganglionic level infarction (caudate, lentiform nuclei, internal capsule, insula, M1-M3 cortex): 7 - Supraganglionic infarction (M4-M6 cortex): 3 Total score (0-10 with 10 being normal): 10 IMPRESSION: 1. Unchanged appearance of minimal residual right convexity subdural blood without acute hemorrhage. 2. ASPECTS is 10. 3. These results were communicated to Dr. Kerney Elbe at 7:50 pm on 03/14/2019 by text page via the [facility] messaging system. Electronically Signed   By: Ulyses Jarred M.D.   On: 03/14/2019 19:51   Vas [place] Lower Extremity Venous (dvt)  Result Date: 03/15/2019  Lower Venous Study Indications: Stroke, and Edema.  Limitations: Body habitus, poor ultrasound/tissue interface and poor patient cooperation; constant movement. Performing Technologist: Maudry Mayhew MHA, RDMS, RVT, RDCS  Examination Guidelines: A complete evaluation includes B-mode imaging, spectral Doppler, color Doppler, and power Doppler as needed of all accessible portions of each vessel. Bilateral testing is considered an integral part of a complete examination. Limited examinations for reoccurring indications may be performed as noted.  +---------+---------------+---------+-----------+----------+-------------------+ RIGHT    CompressibilityPhasicitySpontaneityPropertiesSummary             +---------+---------------+---------+-----------+----------+-------------------+ CFV      Full                                                             +---------+---------------+---------+-----------+----------+-------------------+ SFJ                                                   Not visualized      +---------+---------------+---------+-----------+----------+-------------------+ FV Prox  Full                                                              +---------+---------------+---------+-----------+----------+-------------------+ FV Mid  patent, limited                                                           visualization       +---------+---------------+---------+-----------+----------+-------------------+ FV Distal                                             Not visualized      +---------+---------------+---------+-----------+----------+-------------------+ POP      Full                                                             +---------+---------------+---------+-----------+----------+-------------------+ PTV                                                   patent, limited                                                           visualization       +---------+---------------+---------+-----------+----------+-------------------+ PERO                                                  Not visualized      +---------+---------------+---------+-----------+----------+-------------------+   +---------+---------------+---------+-----------+----------+--------------+ LEFT     CompressibilityPhasicitySpontaneityPropertiesSummary        +---------+---------------+---------+-----------+----------+--------------+ CFV                                                   Not visualized +---------+---------------+---------+-----------+----------+--------------+ SFJ                                                   Not visualized +---------+---------------+---------+-----------+----------+--------------+ FV Prox  Full                                                        +---------+---------------+---------+-----------+----------+--------------+ FV Mid  Not visualized +---------+---------------+---------+-----------+----------+--------------+ FV Distal                                              Not visualized +---------+---------------+---------+-----------+----------+--------------+ PFV                                                   Not visualized +---------+---------------+---------+-----------+----------+--------------+ POP      Full           Yes      Yes                                 +---------+---------------+---------+-----------+----------+--------------+ PTV                                                   Not visualized +---------+---------------+---------+-----------+----------+--------------+ PERO                                                  Not visualized +---------+---------------+---------+-----------+----------+--------------+     Summary: Right: There is no evidence of deep vein thrombosis in the lower extremity. However, portions of this examination were limited- see technologist comments above. No cystic structure found in the popliteal fossa. Left: There is no evidence of deep vein thrombosis in the lower extremity. However, portions of this examination were limited- see technologist comments above. No cystic structure found in the popliteal fossa.  *See table(s) above for measurements and observations. Electronically signed by Ruta Hinds MD on 03/15/2019 at 6:41:43 PM.    Final    Vas [place] Upper Extremity Venous Duplex  Result Date: 03/23/2019 UPPER VENOUS STUDY  Indications: Swelling Performing Technologist: Oliver Hum RVT  Examination Guidelines: A complete evaluation includes B-mode imaging, spectral Doppler, color Doppler, and power Doppler as needed of all accessible portions of each vessel. Bilateral testing is considered an integral part of a complete examination. Limited examinations for reoccurring indications may be performed as noted.  Right Findings: +----------+------------+---------+-----------+----------+-------+ RIGHT      CompressiblePhasicitySpontaneousPropertiesSummary +----------+------------+---------+-----------+----------+-------+ IJV           Full       Yes       Yes                      +----------+------------+---------+-----------+----------+-------+ Subclavian    Full       Yes       Yes                      +----------+------------+---------+-----------+----------+-------+ Axillary      Full       Yes       Yes                      +----------+------------+---------+-----------+----------+-------+ Brachial      Full       Yes       Yes                      +----------+------------+---------+-----------+----------+-------+  Radial        Full                                          +----------+------------+---------+-----------+----------+-------+ Ulnar         Full                                          +----------+------------+---------+-----------+----------+-------+ Cephalic      None                                  Chronic +----------+------------+---------+-----------+----------+-------+ Basilic       Full                                          +----------+------------+---------+-----------+----------+-------+  Left Findings: +----------+------------+---------+-----------+----------+-------+ LEFT      CompressiblePhasicitySpontaneousPropertiesSummary +----------+------------+---------+-----------+----------+-------+ Subclavian    Full       Yes       Yes                      +----------+------------+---------+-----------+----------+-------+  Summary:  Right: No evidence of deep vein thrombosis in the upper extremity. 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  Left: No evidence of thrombosis in the subclavian.  *See table(s) above for measurements and observations.  Diagnosing physician: Harold Barban MD Electronically signed by Harold Barban MD on 03/23/2019 at 5:11:50 PM.    Final     [place] Ekg Site Rite  Result Date: 03/16/2019 If Site Rite image not attached, placement could not be confirmed due to current cardiac rhythm.   Labs:  Basic Metabolic Panel: Recent Labs  Lab 03/18/19 0400 03/19/19 0610 03/23/19 0937 02/27/2019 0415  NA 138 139 133* 134*  K 3.5 3.5 3.2* 3.4*  CL 106 107 104 103  CO2 22 22 20* 20*  GLUCOSE 160* 108* 189* 173*  BUN 63* 67* 78* 79*  CREATININE 1.95* 1.97* 2.56* 2.75*  CALCIUM 8.1* 8.3* 8.2* 8.6*    CBC: Recent Labs  Lab 03/18/19 0400 03/19/19 0610 03/18/2019 0415  WBC 7.6 6.7 6.3  NEUTROABS  --  4.0  --   HGB 7.7* 7.9* 7.1*  HCT RESULTS UNAVAILABLE DUE TO INTERFERING SUBSTANCE RESULTS UNAVAILABLE DUE TO INTERFERING SUBSTANCE 19.0*  MCV RESULTS UNAVAILABLE DUE TO INTERFERING SUBSTANCE RESULTS UNAVAILABLE DUE TO INTERFERING SUBSTANCE 70.9*  PLT 70* 64* 62*    CBG: Recent Labs  Lab 03/23/19 1203 03/23/19 1746 03/23/19 2305 02/24/2019 0632 03/09/2019 1127  GLUCAP 190* 152* 176* 164* 153*   Family history.  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  Lives alone independent with a rolling walker.  Presented 03/14/2019 with right-sided weakness slurred speech cranial CT scan showed suspected left MCA infarction.  Minimal right convexity subdural blood without acute hemorrhage.  She did not receive TPA.  Echocardiogram ejection fraction 65% lower extremity Dopplers negative.  CT angiogram of head and neck nonocclusive thrombus within the left internal carotid artery underwent arteriogram with mechanical thrombectomy per interventional radiology.  Required intubation for a short time.  Maintained initially on pressors for low blood pressure that slowly improved.  Follow-up neurology services no  antithrombotics at this time given recent SDH mildly elevated INR 1.9 with thrombocytopenia.  She was placed on Augmentin for UTI.  Admitted for a comprehensive rehab program.  Hospital Course: Monica Pearson was admitted to rehab 03/18/2019 for inpatient therapies to consist of PT, ST and OT at least three hours five days a week. Past admission physiatrist, therapy team and rehab RN have worked together to provide customized collaborative inpatient rehab.  Pertaining to Monica Pearson left MCA infarction she had undergone mechanical thrombectomy no current antithrombotics due to recent SDH and mildly elevated INR.  Patient with uncompensated cirrhosis continued worsening initially on Chronulac with elevated creatinine renal services as well as gastroenterology consulted close monitoring of ammonia levels.  She was placed on IV albumin KUB negative.  Awaiting plan for possible need for paracentesis.  She had some swelling of her upper extremity at PICC line site venous Doppler studies negative for DVT.  All diuretics were ultimately discontinued as renal service continue to follow closely 2 g sodium restriction 1 L fluid restriction.  Due to ongoing medical concerns patient was discharged to acute care services   Rehab course: During patient's stay in rehab weekly team conferences were held to monitor patient's progress, set goals and discuss barriers to discharge. At admission, patient required minimal assist to ambulate 14 feet moderate assist sit to side-lying moderate assist sit to stand.  Max assist upper body bathing max assist lower body bathing moderate assist upper body dressing max assist lower body dressing  She  has had improvement in activity tolerance, balance, postural control as well as ability to compensate for deficits. She has had improvement in functional use RUE/LUE  and RLE/LLE as well as improvement in awareness.  Patient with limited participation in light of her ongoing medical  conditions       Disposition: Discharged to acute care services   Diet: Renal diet fluid restriction  Special Instructions: All medication changes made as per medicine team  Discharge Instructions    Ambulatory referral to Neurology   Complete by:  As directed    An appointment is requested in approximately 4 weeks left MCA infarction      Follow-up Information    Kirsteins, Luanna Salk, MD Follow up.   Specialty:  Physical Medicine and Rehabilitation Why:  office to call for appointment Contact information: [place] [address] [phone]        Luanne Bras, MD Follow up.   Specialties:  Interventional Radiology, Radiology Why:  call for appointment Contact information: [place] [address] [phone]           Signed: Cathlyn Parsons 02/28/2019, 11:48 AM

## 2019-03-24 NOTE — Progress Notes (Signed)
Speech Language Pathology Daily Session Note  Patient Details  Name: Monica Pearson MRN: [id number] Date of Birth: 1946/01/17  Today's Date: 03/19/2019 SLP Individual Time: 1030-1100 SLP Individual Time Calculation (min): 30 min  Short Term Goals: Week 1: SLP Short Term Goal 1 (Week 1): Patient will recall new, daily information with supervision verbal and visual cues.  SLP Short Term Goal 1 - Progress (Week 1): Progressing toward goal SLP Short Term Goal 2 (Week 1): Patient will demonstrate complex problem solving for functional tasks with supervision verbal cues.  SLP Short Term Goal 2 - Progress (Week 1): Progressing toward goal  Skilled Therapeutic Interventions: Pt recalled/ identified goals of speech/cognitive therapy, including working on problem-solving, Doctor, hospital.  She completed a checkbook balancing task using self-talk and cueing herself.  She identified alternative method of completing problems, asking if she was permitted to utilize a different method, which was encouraged.  Pt required verbal cue to rewrite numbers in a way that would allow simpler problem-solving.  She demonstrated awareness of deficits and frustration at perceived difficulty/slower completion of tasks. She demonstrates excellent self-monitoring skills.  Unfortunately, due to AKI and Hepatorenal syndrome, she is being D/Cd back to acute care.  Will benefit from ongoing SLP services if/when she returns to CIR.  Pain Pain Assessment Pain Score: 1  Pain Type: Chronic pain Pain Location: Abdomen Pain Descriptors / Indicators: Dull Pain Intervention(s): Repositioned Multiple Pain Sites: No  Therapy/Group: Individual Therapy  Juan Quam Laurice 03/05/2019, 12:22 PM

## 2019-03-25 ENCOUNTER — Inpatient Hospital Stay (HOSPITAL_COMMUNITY): Payer: Self-pay | Admitting: Physical Therapy

## 2019-03-25 ENCOUNTER — Inpatient Hospital Stay (HOSPITAL_COMMUNITY): Payer: Medicare Other | Admitting: Occupational Therapy

## 2019-03-25 ENCOUNTER — Inpatient Hospital Stay (HOSPITAL_COMMUNITY): Payer: Medicare Other

## 2019-03-25 DIAGNOSIS — K7460 Unspecified cirrhosis of liver: Secondary | ICD-10-CM

## 2019-03-25 DIAGNOSIS — Z66 Do not resuscitate: Secondary | ICD-10-CM

## 2019-03-25 DIAGNOSIS — Z794 Long term (current) use of insulin: Secondary | ICD-10-CM

## 2019-03-25 DIAGNOSIS — Z515 Encounter for palliative care: Secondary | ICD-10-CM

## 2019-03-25 DIAGNOSIS — N183 Chronic kidney disease, stage 3 (moderate): Secondary | ICD-10-CM

## 2019-03-25 DIAGNOSIS — K7581 Nonalcoholic steatohepatitis (NASH): Secondary | ICD-10-CM

## 2019-03-25 DIAGNOSIS — R0600 Dyspnea, unspecified: Secondary | ICD-10-CM

## 2019-03-25 DIAGNOSIS — N179 Acute kidney failure, unspecified: Secondary | ICD-10-CM

## 2019-03-25 DIAGNOSIS — R188 Other ascites: Secondary | ICD-10-CM

## 2019-03-25 DIAGNOSIS — Z7189 Other specified counseling: Secondary | ICD-10-CM

## 2019-03-25 DIAGNOSIS — E119 Type 2 diabetes mellitus without complications: Secondary | ICD-10-CM

## 2019-03-25 LAB — CBC
HCT: 17.4 % — ABNORMAL LOW (ref 36.0–46.0)
Hemoglobin: 6.5 g/dL — CL (ref 12.0–15.0)
MCH: 26.6 pg (ref 26.0–34.0)
MCHC: 37.4 g/dL — ABNORMAL HIGH (ref 30.0–36.0)
MCV: 71.3 fL — ABNORMAL LOW (ref 80.0–100.0)
Platelets: 57 10*3/uL — ABNORMAL LOW (ref 150–400)
RBC: 2.44 MIL/uL — ABNORMAL LOW (ref 3.87–5.11)
RDW: 17.9 % — ABNORMAL HIGH (ref 11.5–15.5)
WBC: 4.1 10*3/uL (ref 4.0–10.5)
nRBC: 0.5 % — ABNORMAL HIGH (ref 0.0–0.2)

## 2019-03-25 LAB — COMPREHENSIVE METABOLIC PANEL
ALT: 42 U/L (ref 0–44)
AST: 51 U/L — ABNORMAL HIGH (ref 15–41)
Albumin: 3.2 g/dL — ABNORMAL LOW (ref 3.5–5.0)
Alkaline Phosphatase: 161 U/L — ABNORMAL HIGH (ref 38–126)
Anion gap: 11 (ref 5–15)
BUN: 82 mg/dL — ABNORMAL HIGH (ref 8–23)
CO2: 21 mmol/L — ABNORMAL LOW (ref 22–32)
Calcium: 8.8 mg/dL — ABNORMAL LOW (ref 8.9–10.3)
Chloride: 103 mmol/L (ref 98–111)
Creatinine, Ser: 3.33 mg/dL — ABNORMAL HIGH (ref 0.44–1.00)
GFR calc Af Amer: 15 mL/min — ABNORMAL LOW (ref >60)
GFR calc non Af Amer: 13 mL/min — ABNORMAL LOW (ref >60)
Glucose, Bld: 189 mg/dL — ABNORMAL HIGH (ref 70–99)
Potassium: 3.7 mmol/L (ref 3.5–5.1)
Sodium: 135 mmol/L (ref 135–145)
Total Bilirubin: 3.3 mg/dL — ABNORMAL HIGH (ref 0.3–1.2)
Total Protein: 5.5 g/dL — ABNORMAL LOW (ref 6.5–8.1)

## 2019-03-25 LAB — GLUCOSE, CAPILLARY
Glucose-Capillary: 181 mg/dL — ABNORMAL HIGH (ref 70–99)
Glucose-Capillary: 279 mg/dL — ABNORMAL HIGH (ref 70–99)

## 2019-03-25 MED ORDER — HYDROMORPHONE HCL 1 MG/ML IJ SOLN: 0.5000 mg | INTRAMUSCULAR | Status: DC | PRN

## 2019-03-25 MED ORDER — ONDANSETRON HCL 4 MG/2ML IJ SOLN
4.0000 mg | Freq: Four times a day (QID) | INTRAMUSCULAR | Status: DC
  Administered 2019-03-26 – 2019-03-28 (×9): 4 mg via INTRAVENOUS
  Filled 2019-03-25 (×9): qty 2

## 2019-03-25 MED ORDER — SODIUM CHLORIDE 0.9% IV SOLUTION: Freq: Once | INTRAVENOUS | Status: DC

## 2019-03-25 MED ORDER — ONDANSETRON HCL 4 MG/2ML IJ SOLN
4.0000 mg | Freq: Once | INTRAMUSCULAR | Status: AC
  Administered 2019-03-25: 4 mg via INTRAVENOUS
  Filled 2019-03-25: qty 2

## 2019-03-25 MED ORDER — GLYCOPYRROLATE 0.2 MG/ML IJ SOLN
0.4000 mg | INTRAMUSCULAR | Status: DC
  Administered 2019-03-25 – 2019-03-28 (×13): 0.4 mg via INTRAVENOUS
  Filled 2019-03-25 (×14): qty 2

## 2019-03-25 MED ORDER — HYDROMORPHONE HCL 1 MG/ML IJ SOLN
0.5000 mg | INTRAMUSCULAR | Status: AC
  Administered 2019-03-25: 0.5 mg via INTRAVENOUS
  Filled 2019-03-25: qty 0.5

## 2019-03-25 MED ORDER — IPRATROPIUM-ALBUTEROL 0.5-2.5 (3) MG/3ML IN SOLN: 3.0000 mL | Freq: Four times a day (QID) | RESPIRATORY_TRACT | Status: DC | PRN

## 2019-03-25 MED ORDER — PROMETHAZINE HCL 25 MG/ML IJ SOLN: 6.2500 mg | Freq: Four times a day (QID) | INTRAMUSCULAR | Status: DC | PRN

## 2019-03-25 MED ORDER — SODIUM CHLORIDE 0.9 % IV SOLN
50.0000 ug/h | INTRAVENOUS | Status: DC
  Administered 2019-03-25 – 2019-03-28 (×7): 50 ug/h via INTRAVENOUS
  Filled 2019-03-25 (×8): qty 1

## 2019-03-25 MED ORDER — SODIUM CHLORIDE 0.9% FLUSH
10.0000 mL | Freq: Two times a day (BID) | INTRAVENOUS | Status: DC
  Administered 2019-03-25 – 2019-03-27 (×6): 10 mL

## 2019-03-25 MED ORDER — HYDROMORPHONE HCL 1 MG/ML IJ SOLN
0.5000 mg | INTRAMUSCULAR | Status: DC | PRN
  Administered 2019-03-27: 0.5 mg via INTRAVENOUS
  Filled 2019-03-25: qty 0.5

## 2019-03-25 MED ORDER — IPRATROPIUM-ALBUTEROL 0.5-2.5 (3) MG/3ML IN SOLN
3.0000 mL | Freq: Four times a day (QID) | RESPIRATORY_TRACT | Status: DC
  Administered 2019-03-25 (×2): 3 mL via RESPIRATORY_TRACT
  Filled 2019-03-25 (×2): qty 3

## 2019-03-25 MED ORDER — SODIUM CHLORIDE 0.9% FLUSH: 10.0000 mL | INTRAVENOUS | Status: DC | PRN

## 2019-03-25 NOTE — Progress Notes (Signed)
CRITICAL VALUE ALERT  Critical Value:  hgb = 6.5  Date & Time Notied:  03/25/19 @ 9470  Provider Notified: Renne Crigler MD @ [phone]  Orders Received/Actions taken: 1 unit RBC ordered for pt at 0915  Pt has refused transfusion due to religious beliefs. Gherghe MD notified.

## 2019-03-25 NOTE — Progress Notes (Signed)
Pt son call to and gave name of family member that will be coming to visit, Dorita Fray, Lynda Rainwater and Murlean Iba are authorize member .

## 2019-03-25 NOTE — Progress Notes (Signed)
Inpatient Diabetes Program Recommendations  AACE/ADA: New Consensus Statement on Inpatient Glycemic Control (2015)  Target Ranges:  Prepandial:   less than 140 mg/dL      Peak postprandial:   less than 180 mg/dL (1-2 hours)      Critically ill patients:  140 - 180 mg/dL   Lab Results  Component Value Date   GLUCAP 279 (H) 03/25/2019   HGBA1C 6.2 (H) 03/15/2019    Review of Glycemic Control  Diabetes history: DM2 Outpatient Diabetes medications: Novolog 0-15 units tidwc and hs, glipizide 2.5 mg QAM Current orders for Inpatient glycemic control: None  HgbA1C - 6.2%  Inpatient Diabetes Program Recommendations:     Add Novolog 0-9 units tidwc Add CHO mod med to 2 gm Na+ diet.  Follow.  Thank you. Lorenda Peck, RD, LDN, CDE Inpatient Diabetes Coordinator [phone]

## 2019-03-25 NOTE — Progress Notes (Addendum)
PROGRESS NOTE  Monica Pearson WEX:[id number] DOB: 1946/11/18 DOA: 03/02/2019 PCP: Jenel Lucks, PA-C   LOS: 1 day   Brief Narrative / Interim history: 73 year old female with history of liver cirrhosis due to [name], hypertension, hyperlipidemia, recent CVA for which she was hospitalized in inpatient rehab up until 03/02/2019, subdural hematoma on 1/20, is readmitted to the hospital from inpatient rehab due to progressive renal failure, significant fluid overload / anasarca and progressive weakness  Subjective: -She is feeling weak this morning, denies any chest pain, complains of mild shortness of breath and has audible wheezing.  Denies any abdominal pain, nausea or vomiting.  Assessment & Plan: Principal Problem:   Hepatorenal syndrome ([place]) Active Problems:   Insulin dependent diabetes mellitus (HCC)   Hypertension   Obstructive sleep apnea treated with continuous positive airway pressure (CPAP)   Liver cirrhosis secondary to NASH (HCC)   Ascites   Acute renal failure superimposed on stage 3 chronic kidney disease (HCC)   Left middle cerebral artery stroke (HCC)   Principal Problem Acute kidney injury due to hepatorenal syndrome underlying chronic kidney disease stage III -Patient here is a very poor prognosis, she has progressive worsening renal failure in the setting of underlying liver cirrhosis.  Nephrology and gastroenterology consulted and are following.  Given progressive renal failure will consult palliative as well today -She had to be started on AMO therapy and transferred to the acute care hospital from inpatient rehab. -Concerned that revascularization and contrast-induced nephropathy may have precipitated this change -Concerning is that her renal function continues to deteriorate this morning  Active Problems Liver cirrhosis secondary to [facility] with ascites -This appears to be decompensated with anasarca/fluid overload as well as hepatorenal syndrome -Meld score  elevated -Poorly responsive to diuretics before, but now appearing to get fluid overloaded, she has more wheezing, will repeat a chest x-ray and may need to stop albumin.  She is on room air  Anemia of chronic kidney disease -Hemoglobin slightly worsening today, this is likely multifactorial in the setting of chronic kidney disease as well as fluid overload, patient is a [employer] and is refusing blood transfusions.  She is okay with albumin  Recent Klebsiella and enterococcus UTI -She was placed on Augmentin but seems to have finished already as cultures were done about 10 days prior to this admission  Hypotension, history of hypertension -Currently on Middaugh drain, continue  IDDM -Continue sliding scale  OSA on CPAP -Continue nightly CPAP   Scheduled Meds:  sodium chloride   Intravenous Once   insulin aspart  0-9 Units Subcutaneous TID WC   lactulose  10 g Oral BID   magnesium oxide  400 mg Oral QHS   midodrine  10 mg Oral TID WC   pantoprazole  40 mg Oral Q1200   sodium chloride flush  10-40 mL Intracatheter Q12H   Continuous Infusions:  albumin human 25 g (03/25/19 0853)   octreotide  (SANDOSTATIN)    IV infusion 50 mcg/hr (03/25/19 0904)   PRN Meds:.acetaminophen **OR** acetaminophen, calcium carbonate (dosed in mg elemental calcium), camphor-menthol **AND** hydrOXYzine, docusate sodium, feeding supplement (NEPRO CARB STEADY), ondansetron **OR** ondansetron (ZOFRAN) IV, sodium chloride flush, sorbitol, zolpidem  DVT prophylaxis: SCDs Code Status: Full code Family Communication: no family at bedside Disposition Plan: TBD  Consultants:   Nephrology  GI  Palliative care   Procedures:  Paracentesis 200 mL  Antimicrobials:  None    Objective: Vitals:   03/01/2019 1708 03/03/2019 2106 02/24/2019 2322 03/25/19 0500  BP:  99/66    Pulse:  76 76   Resp:   16   Temp:  97.6 F (36.4 C)    TempSrc:  Oral    SpO2:  100% 98%   Weight: 118.3 kg    119.8 kg  Height: 5' 3"  (1.6 m)       Intake/Output Summary (Last 24 hours) at 03/25/2019 1146 Last data filed at 03/25/2019 0657 Gross per 24 hour  Intake 1164.01 ml  Output 305 ml  Net 859.01 ml   Filed Weights   03/23/2019 1708 03/25/19 0500  Weight: 118.3 kg 119.8 kg    Examination:  Constitutional: NAD Eyes: PERRL, lids and conjunctivae normal ENMT: Mucous membranes are moist. No oropharyngeal exudates Neck: normal, supple Respiratory: Diffuse bilateral end expiratory wheezing, no crackles heard.  Tachypneic. Cardiovascular: Regular rate and rhythm, no murmurs / rubs / gallops. No LE edema. .  Abdomen: no tenderness. Bowel sounds positive.  Musculoskeletal: no clubbing / cyanosis. Skin: no rashes Neurologic: CN 2-12 grossly intact. Strength 5/5 in all 4.  Psychiatric: Normal judgment and insight. Alert and oriented x 3. Normal mood.    Data Reviewed: I have independently reviewed following labs and imaging studies   CBC: Recent Labs  Lab 03/19/19 0610 03/03/2019 0415 03/25/19 0737  WBC 6.7 6.3 4.1  NEUTROABS 4.0  --   --   HGB 7.9* 7.1* 6.5*  HCT RESULTS UNAVAILABLE DUE TO INTERFERING SUBSTANCE 19.0* 17.4*  MCV RESULTS UNAVAILABLE DUE TO INTERFERING SUBSTANCE 70.9* 71.3*  PLT 64* 62* 57*   Basic Metabolic Panel: Recent Labs  Lab 03/19/19 0610 03/23/19 0937 02/26/2019 0415 03/25/19 0737  NA 139 133* 134* 135  K 3.5 3.2* 3.4* 3.7  CL 107 104 103 103  CO2 22 20* 20* 21*  GLUCOSE 108* 189* 173* 189*  BUN 67* 78* 79* 82*  CREATININE 1.97* 2.56* 2.75* 3.33*  CALCIUM 8.3* 8.2* 8.6* 8.8*   GFR: Estimated Creatinine Clearance: 18.9 mL/min (A) (by C-G formula based on SCr of 3.33 mg/dL (H)). Liver Function Tests: Recent Labs  Lab 03/19/19 0610 03/23/19 1730 03/23/2019 0415 03/25/19 0737  AST 59* 70* 58* 51*  ALT 65* 62* 52* 42  ALKPHOS 186* 232* 221* 161*  BILITOT 3.1* 3.0* 2.7* 3.3*  PROT 5.2* 5.7* 5.2* 5.5*  ALBUMIN 2.2* 2.3* 2.4* 3.2*   No results for  input(s): LIPASE, AMYLASE in the last 168 hours. Recent Labs  Lab 03/18/19 1905 03/22/19 2333  AMMONIA 33 44*   Coagulation Profile: Recent Labs  Lab 03/20/19 0310 03/21/19 0350 03/22/19 0423 03/23/19 0404 02/27/2019 0415  INR 1.5* 1.6* 1.6* 1.6* 1.6*   Cardiac Enzymes: No results for input(s): CKTOTAL, CKMB, CKMBINDEX, TROPONINI in the last 168 hours. BNP (last 3 results) No results for input(s): PROBNP in the last 8760 hours. HbA1C: No results for input(s): HGBA1C in the last 72 hours. CBG: Recent Labs  Lab 03/18/2019 0632 02/26/2019 1127 03/19/2019 1649 02/25/2019 2139 03/25/19 0803  GLUCAP 164* 153* 156* 182* 181*   Lipid Profile: No results for input(s): CHOL, HDL, LDLCALC, TRIG, CHOLHDL, LDLDIRECT in the last 72 hours. Thyroid Function Tests: No results for input(s): TSH, T4TOTAL, FREET4, T3FREE, THYROIDAB in the last 72 hours. Anemia Panel: No results for input(s): VITAMINB12, FOLATE, FERRITIN, TIBC, IRON, RETICCTPCT in the last 72 hours. Urine analysis:    Component Value Date/Time   COLORURINE YELLOW 03/03/2019 0521   APPEARANCEUR HAZY (A) 03/17/2019 0521   LABSPEC 1.014 03/12/2019 0521   PHURINE 5.0 03/17/2019 2683  GLUCOSEU NEGATIVE 03/07/2019 0521   HGBUR SMALL (A) 03/09/2019 0521   BILIRUBINUR NEGATIVE 03/21/2019 0521   KETONESUR NEGATIVE 03/18/2019 0521   PROTEINUR NEGATIVE 03/22/2019 0521   UROBILINOGEN 0.2 02/23/2015 2000   NITRITE NEGATIVE 02/26/2019 0521   LEUKOCYTESUR TRACE (A) 03/17/2019 0521   Sepsis Labs: Invalid input(s): PROCALCITONIN, LACTICIDVEN  Recent Results (from the past 240 hour(s))  Culture, body fluid-bottle     Status: None (Preliminary result)   Collection Time: 03/21/2019  1:59 PM  Result Value Ref Range Status   Specimen Description PERITONEAL  Final   Special Requests NONE  Final   Culture   Final    NO GROWTH < 24 HOURS Performed at [facility], [address] [place], [place] [address]    Report Status PENDING   Incomplete  Gram stain     Status: None   Collection Time: 03/01/2019  1:59 PM  Result Value Ref Range Status   Specimen Description PERITONEAL  Final   Special Requests NONE  Final   Gram Stain   Final    FEW WBC PRESENT,BOTH PMN AND MONONUCLEAR NO ORGANISMS SEEN Performed at [facility], [address], [place],  [id number]    Report Status 03/18/2019 FINAL  Final      Radiology Studies: Dg Abd 1 View  Result Date: 03/05/2019 CLINICAL DATA:  Palpable firmness in the upper abdomen on clinical evaluation. EXAM: Portable ABDOMEN-1 VIEW COMPARISON:  03/18/2019. FINDINGS: Normal bowel gas pattern. Moderate stool in the transverse colon. Surgical clips in the RIGHT UPPER QUADRANT from prior cholecystectomy. Degenerative changes involving the lumbar spine. IMPRESSION: No acute abdominal abnormality. Electronically Signed   By: Evangeline Dakin M.D.   On: 03/04/2019 14:51   US Abdomen Complete  Result Date: 03/23/2019 CLINICAL DATA:  Cirrhosis.  Ascites. EXAM: ABDOMEN ULTRASOUND COMPLETE COMPARISON:  None. FINDINGS: Gallbladder: Surgically absent. Common bile duct: Diameter: 4 mm, within normal limits. Liver: Advanced hepatic cirrhosis seen. No liver masses are visualized. Portal vein is patent on color Doppler imaging with normal direction of blood flow towards the liver. IVC: No abnormality visualized. Pancreas: Visualized portion unremarkable. Spleen: Size and appearance within normal limits. Right Kidney: Length: 10.8 cm. Echogenicity within normal limits. No mass or hydronephrosis visualized. Left Kidney: Length: 11.5 cm. Echogenicity within normal limits. No mass or hydronephrosis visualized. Abdominal aorta: No aneurysm visualized. Other findings: Moderate amount of ascites. IMPRESSION: Hepatic cirrhosis and moderate ascites. No definite hepatic mass visualized. Prior cholecystectomy.  No evidence of biliary ductal dilatation. Electronically Signed   By: Earle Gell M.D.   On:  03/23/2019 22:06   US Liver Doppler  Result Date: 02/28/2019 CLINICAL DATA:  Cirrhosis, ascites EXAM: DUPLEX ULTRASOUND OF LIVER TECHNIQUE: Color and duplex Doppler ultrasound was performed to evaluate the hepatic in-flow and out-flow vessels. COMPARISON:  CT 07/15/2017 FINDINGS: Portal Vein: No occlusion or thrombus. 1.1 cm diameter. Velocities (all hepatopetal): Main:  17-32 cm/sec Right:  13 cm/sec Left:  14 cm/sec Hepatic Vein Velocities (all hepatofugal): Right:  21 cm/sec Middle:  23 cm/sec Left:  20 cm/sec IVC (intrahepatic): 11 cm/seconds Hepatic Artery Velocity:  35 cm/sec Spleen 8.6 x 4.2 x 4.9 cm (volume = 93 cm^3). Splenic Vein: No occlusion or thrombus. Velocity: 18 cm/sec Varices: None identified Ascites: Present IMPRESSION: 1. Unremarkable hepatic vascular Doppler evaluation. 2. Persistent abdominal ascites. Electronically Signed   By: Lucrezia Europe M.D.   On: 03/25/2019 07:59   Vas [place] Upper Extremity Venous Duplex  Result  Date: 03/23/2019 UPPER VENOUS STUDY  Indications: Swelling Performing Technologist: Oliver Hum RVT  Examination Guidelines: A complete evaluation includes B-mode imaging, spectral Doppler, color Doppler, and power Doppler as needed of all accessible portions of each vessel. Bilateral testing is considered an integral part of a complete examination. Limited examinations for reoccurring indications may be performed as noted.  Right Findings: +----------+------------+---------+-----------+----------+-------+  RIGHT      Compressible Phasicity Spontaneous Properties Summary  +----------+------------+---------+-----------+----------+-------+  IJV            Full        Yes        Yes                         +----------+------------+---------+-----------+----------+-------+  Subclavian     Full        Yes        Yes                         +----------+------------+---------+-----------+----------+-------+  Axillary       Full        Yes        Yes                          +----------+------------+---------+-----------+----------+-------+  Brachial       Full        Yes        Yes                         +----------+------------+---------+-----------+----------+-------+  Radial         Full                                               +----------+------------+---------+-----------+----------+-------+  Ulnar          Full                                               +----------+------------+---------+-----------+----------+-------+  Cephalic       None                                      Chronic  +----------+------------+---------+-----------+----------+-------+  Basilic        Full                                               +----------+------------+---------+-----------+----------+-------+  Left Findings: +----------+------------+---------+-----------+----------+-------+  LEFT       Compressible Phasicity Spontaneous Properties Summary  +----------+------------+---------+-----------+----------+-------+  Subclavian     Full        Yes        Yes                         +----------+------------+---------+-----------+----------+-------+  Summary:  Right: No evidence of deep vein thrombosis in the upper extremity. 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  Left: No  evidence of thrombosis in the subclavian.  *See table(s) above for measurements and observations.  Diagnosing physician: Harold Barban MD Electronically signed by Harold Barban MD on 03/23/2019 at 5:11:50 PM.    Final    Ir Paracentesis  Result Date: 02/24/2019 INDICATION: Patient with history of cirrhosis and ascites. Request is made for diagnostic paracentesis. EXAM: ULTRASOUND GUIDED DIAGNOSTIC PARACENTESIS MEDICATIONS: 10 mL 1% lidocaine COMPLICATIONS: None immediate. PROCEDURE: Informed written consent was obtained from the patient after a discussion of the risks, benefits and alternatives to treatment. A timeout was performed prior to the initiation of the  procedure. Initial ultrasound scanning demonstrates a tiny amount of ascites within the right abdomen. The right lower abdomen was prepped and draped in the usual sterile fashion. 1% lidocaine was used for local anesthesia. Following this, a Safe-T-Centesis catheter was directed into the ascites using ultrasound guidance. However, minimal fluid could be aspirated. Therefore, the Safe-T-Centesis catheter was removed. 19 gauge, 15-cm, Yueh catheter was introduced by Dr. Anselm Pancoast using ultrasound guidance. An ultrasound image was saved for documentation purposes. The paracentesis was performed. The catheter was removed and a dressing was applied. The patient tolerated the procedure well without immediate post procedural complication. FINDINGS: A total of approximately 200 mL of clear yellow fluid was removed. Samples were sent to the laboratory as requested by the clinical team. IMPRESSION: Successful ultrasound-guided paracentesis yielding 200 mL of peritoneal fluid. Read by: Earley Abide, PA-C Electronically Signed   By: Markus Daft M.D.   On: 03/11/2019 14:13    Marzetta Board, MD, PhD [facility]  Contact via  [email or web address]  [place] P: [phone]  F: [phone]

## 2019-03-25 NOTE — Progress Notes (Signed)
RN rounded on pt to administer midodrine and robinul. Pt was minimally responsive- opened eyes when sternal rubbed, but not alert to person, place, time, &/or situation. Pt ended up vomiting up her lunch. RN set up suction and completed mouth care. Pt's dentures were removed and and placed in hot pink denture cup on sink. RN reported findings to [facility] MD. One time dose of zofran given. RN updated change in pt's baseline to her son Dorita Fray. He verbalized understanding and stated family is all out of town, but organizing plans to make it in to see her. RN explained pt's baseline changing hourly. He verbalized understanding.   RN reported all findings to [facility] RN who was taking over pt's care for rest of day shift.

## 2019-03-25 NOTE — Progress Notes (Signed)
DBIV consult: Blood drawn for T+S. Pt stated she will not accept a blood transfusion "under any circumstances". Note to lab to cancel. RN aware, in room to discuss with pt.

## 2019-03-25 NOTE — Progress Notes (Addendum)
Progress Note    ASSESSMENT AND PLAN:   [age] year [age] patient with decompensated Monica Pearson cirrhosis in setting ofrecent CVAs/p revascularization of left ICA /acute respiratory failure/UTI. She has AKI / CKD. Nephrology treating for HRS. Renal function deteriorated further overnight. Having minimal urine output -Cr continues to rise 2.7 >>> 3.33. BUN 82.   -No evidence for SBP on diagnostic paracentesis yesterday. SAAG c/w portal HTN -Resumed home Xifaxan yesterday -Breathing is labored to some degree. Patient tells me that Palliative Care will see her and she also says there has been mention of Hospice. She feels shocked and very sad right now. Asked me to update son Dorita Fray. He is already aware of Palliative Care consult and worsening renal function. I left him a VM to confirm that Palliative Care would be seeing her to help establish goals of care.   2. Progressive anemia, no signs of active bleeding. Doesn't take blood - Jehovah's Witness   SUBJECTIVE   Physically feels okay, just saddened by news about progressive renal failure and progressive.    OBJECTIVE:     Vital signs in last 24 hours: Temp:  [97.5 F (36.4 C)-97.6 F (36.4 C)] 97.6 F (36.4 C) (04/29 2106) Pulse Rate:  [76-137] 76 (04/29 2322) Resp:  [16] 16 (04/29 2322) BP: (99-106)/(65-66) 99/66 (04/29 2106) SpO2:  [97 %-100 %] 98 % (04/29 2322) Weight:  [118.3 kg-119.8 kg] 119.8 kg (04/30 0500) Last BM Date: 03/11/2019 General:   Alert, female in NAD EENT:  Normal hearing, icteric sclera  Heart:  Regular rate and rhythm.  Anasarca   Pulm: Breathing slightly labored. Wheezing (tracheal).  Abdomen:  Soft,  nontender.  Normal bowel sounds,.       Neurologic:  Alert and  oriented x4;  grossly normal neurologically. Psych:  Pleasant, cooperative.  Normal mood and affect.   Intake/Output from previous day: 04/29 0701 - 04/30 0700 In: 1164 [P.O.:240; IV Piggyback:924] Out: 305 [Urine:304;  Stool:1] Intake/Output this shift: No intake/output data recorded.  Lab Results: Recent Labs    03/02/2019 0415 03/25/19 0737  WBC 6.3 4.1  HGB 7.1* 6.5*  HCT 19.0* 17.4*  PLT 62* 57*   BMET Recent Labs    03/23/19 0937 03/01/2019 0415 03/25/19 0737  NA 133* 134* 135  K 3.2* 3.4* 3.7  CL 104 103 103  CO2 20* 20* 21*  GLUCOSE 189* 173* 189*  BUN 78* 79* 82*  CREATININE 2.56* 2.75* 3.33*  CALCIUM 8.2* 8.6* 8.8*   LFT Recent Labs    03/23/19 1730  03/25/19 0737  PROT 5.7*   < > 5.5*  ALBUMIN 2.3*   < > 3.2*  AST 70*   < > 51*  ALT 62*   < > 42  ALKPHOS 232*   < > 161*  BILITOT 3.0*   < > 3.3*  BILIDIR 1.1*  --   --   IBILI 1.9*  --   --    < > = values in this interval not displayed.   PT/INR Recent Labs    03/23/19 0404 02/24/2019 0415  LABPROT 19.2* 19.1*  INR 1.6* 1.6*   Hepatitis Panel No results for input(s): HEPBSAG, HCVAB, HEPAIGM, HEPBIGM in the last 72 hours.  Dg Abd 1 View  Result Date: 03/13/2019 CLINICAL DATA:  Palpable firmness in the upper abdomen on clinical evaluation. EXAM: Portable ABDOMEN-1 VIEW COMPARISON:  03/18/2019. FINDINGS: Normal bowel gas pattern. Moderate stool in the transverse colon. Surgical clips in the RIGHT UPPER QUADRANT from prior  cholecystectomy. Degenerative changes involving the lumbar spine. IMPRESSION: No acute abdominal abnormality. Electronically Signed   By: Evangeline Dakin M.D.   On: 03/04/2019 14:51   US Abdomen Complete  Result Date: 03/23/2019 CLINICAL DATA:  Cirrhosis.  Ascites. EXAM: ABDOMEN ULTRASOUND COMPLETE COMPARISON:  None. FINDINGS: Gallbladder: Surgically absent. Common bile duct: Diameter: 4 mm, within normal limits. Liver: Advanced hepatic cirrhosis seen. No liver masses are visualized. Portal vein is patent on color Doppler imaging with normal direction of blood flow towards the liver. IVC: No abnormality visualized. Pancreas: Visualized portion unremarkable. Spleen: Size and appearance within normal  limits. Right Kidney: Length: 10.8 cm. Echogenicity within normal limits. No mass or hydronephrosis visualized. Left Kidney: Length: 11.5 cm. Echogenicity within normal limits. No mass or hydronephrosis visualized. Abdominal aorta: No aneurysm visualized. Other findings: Moderate amount of ascites. IMPRESSION: Hepatic cirrhosis and moderate ascites. No definite hepatic mass visualized. Prior cholecystectomy.  No evidence of biliary ductal dilatation. Electronically Signed   By: Earle Gell M.D.   On: 03/23/2019 22:06   US Liver Doppler  Result Date: 03/12/2019 CLINICAL DATA:  Cirrhosis, ascites EXAM: DUPLEX ULTRASOUND OF LIVER TECHNIQUE: Color and duplex Doppler ultrasound was performed to evaluate the hepatic in-flow and out-flow vessels. COMPARISON:  CT 07/15/2017 FINDINGS: Portal Vein: No occlusion or thrombus. 1.1 cm diameter. Velocities (all hepatopetal): Main:  17-32 cm/sec Right:  13 cm/sec Left:  14 cm/sec Hepatic Vein Velocities (all hepatofugal): Right:  21 cm/sec Middle:  23 cm/sec Left:  20 cm/sec IVC (intrahepatic): 11 cm/seconds Hepatic Artery Velocity:  35 cm/sec Spleen 8.6 x 4.2 x 4.9 cm (volume = 93 cm^3). Splenic Vein: No occlusion or thrombus. Velocity: 18 cm/sec Varices: None identified Ascites: Present IMPRESSION: 1. Unremarkable hepatic vascular Doppler evaluation. 2. Persistent abdominal ascites. Electronically Signed   By: Lucrezia Europe M.D.   On: 03/04/2019 07:59   Vas [place] Upper Extremity Venous Duplex  Result Date: 03/23/2019 UPPER VENOUS STUDY  Indications: Swelling Performing Technologist: Oliver Hum RVT  Examination Guidelines: A complete evaluation includes B-mode imaging, spectral Doppler, color Doppler, and power Doppler as needed of all accessible portions of each vessel. Bilateral testing is considered an integral part of a complete examination. Limited examinations for reoccurring indications may be performed as noted.  Right Findings:  +----------+------------+---------+-----------+----------+-------+  RIGHT      Compressible Phasicity Spontaneous Properties Summary  +----------+------------+---------+-----------+----------+-------+  IJV            Full        Yes        Yes                         +----------+------------+---------+-----------+----------+-------+  Subclavian     Full        Yes        Yes                         +----------+------------+---------+-----------+----------+-------+  Axillary       Full        Yes        Yes                         +----------+------------+---------+-----------+----------+-------+  Brachial       Full        Yes        Yes                         +----------+------------+---------+-----------+----------+-------+  Radial         Full                                               +----------+------------+---------+-----------+----------+-------+  Ulnar          Full                                               +----------+------------+---------+-----------+----------+-------+  Cephalic       None                                      Chronic  +----------+------------+---------+-----------+----------+-------+  Basilic        Full                                               +----------+------------+---------+-----------+----------+-------+  Left Findings: +----------+------------+---------+-----------+----------+-------+  LEFT       Compressible Phasicity Spontaneous Properties Summary  +----------+------------+---------+-----------+----------+-------+  Subclavian     Full        Yes        Yes                         +----------+------------+---------+-----------+----------+-------+  Summary:  Right: No evidence of deep vein thrombosis in the upper extremity. 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  Left: No evidence of thrombosis in the subclavian.  *See table(s) above for measurements and observations.  Diagnosing physician: Harold Barban MD  Electronically signed by Harold Barban MD on 03/23/2019 at 5:11:50 PM.    Final    Ir Paracentesis  Result Date: 03/02/2019 INDICATION: Patient with history of cirrhosis and ascites. Request is made for diagnostic paracentesis. EXAM: ULTRASOUND GUIDED DIAGNOSTIC PARACENTESIS MEDICATIONS: 10 mL 1% lidocaine COMPLICATIONS: None immediate. PROCEDURE: Informed written consent was obtained from the patient after a discussion of the risks, benefits and alternatives to treatment. A timeout was performed prior to the initiation of the procedure. Initial ultrasound scanning demonstrates a tiny amount of ascites within the right abdomen. The right lower abdomen was prepped and draped in the usual sterile fashion. 1% lidocaine was used for local anesthesia. Following this, a Safe-T-Centesis catheter was directed into the ascites using ultrasound guidance. However, minimal fluid could be aspirated. Therefore, the Safe-T-Centesis catheter was removed. 19 gauge, 15-cm, Yueh catheter was introduced by Dr. Anselm Pancoast using ultrasound guidance. An ultrasound image was saved for documentation purposes. The paracentesis was performed. The catheter was removed and a dressing was applied. The patient tolerated the procedure well without immediate post procedural complication. FINDINGS: A total of approximately 200 mL of clear yellow fluid was removed. Samples were sent to the laboratory as requested by the clinical team. IMPRESSION: Successful ultrasound-guided paracentesis yielding 200 mL of peritoneal fluid. Read by: Earley Abide, PA-C Electronically Signed   By: Markus Daft M.D.   On: 03/03/2019 14:13    Principal Problem:   Hepatorenal syndrome ([place]) Active Problems:   Insulin dependent diabetes mellitus ([place])   Hypertension  Obstructive sleep apnea treated with continuous positive airway pressure (CPAP)   Liver cirrhosis secondary to NASH ([facility])   Ascites   Acute renal failure superimposed on stage 3 chronic kidney disease  ([place])   Left middle cerebral artery stroke ([place])     LOS: 1 day   Tye Savoy ,NP 03/25/2019, 11:53 AM   Attending physician's note   I have taken an interval history, reviewed the chart and examined the patient. I agree with the Advanced Practitioner's note, impression and recommendations.   Worsening creatinine, hepatorenal syndrome Worsening liver decompensation Plan to transition to hospice care We will sign off, please call with any questions  K. Denzil Magnuson , MD [phone]

## 2019-03-25 NOTE — Progress Notes (Signed)
Admit: 03/04/2019 LOS: 1  28F NAFLD cirrhosis with acute ischemic CVA s/p revascularization 4/19 with AKI concerning for HRS1  Subjective:  . Started octreotide and Midodrine . Renal function continues to worsen . Diagnostic paracentesis negative for SBP   04/29 0701 - 04/30 0700 In: 1164 [P.O.:240; IV Piggyback:924] Out: 305 [Urine:304; Stool:1]  Filed Weights   03/04/2019 1708 03/25/19 0500  Weight: 118.3 kg 119.8 kg    Scheduled Meds: . sodium chloride   Intravenous Once  . insulin aspart  0-9 Units Subcutaneous TID WC  . ipratropium-albuterol  3 mL Nebulization Q6H  . lactulose  10 g Oral BID  . magnesium oxide  400 mg Oral QHS  . midodrine  10 mg Oral TID WC  . pantoprazole  40 mg Oral Q1200  . sodium chloride flush  10-40 mL Intracatheter Q12H   Continuous Infusions: . albumin human 25 g (03/25/19 0853)  . octreotide  (SANDOSTATIN)    IV infusion 50 mcg/hr (03/25/19 0904)   PRN Meds:.acetaminophen **OR** acetaminophen, calcium carbonate (dosed in mg elemental calcium), camphor-menthol **AND** hydrOXYzine, docusate sodium, feeding supplement (NEPRO CARB STEADY), ondansetron **OR** ondansetron (ZOFRAN) IV, sodium chloride flush, sorbitol, zolpidem  Current Labs: reviewed  Results for MECHELLE, PATES (MRN [id number]) as of 03/11/2019 14:18  Ref. Range 02/28/2019 05:21  Protein Creatinine Ratio Latest Ref Range: 0.00 - 0.15 mg/mgCre 0.07  Sodium, Urine Latest Units: mmol/L <10    Physical Exam:  Blood pressure 99/66, pulse 76, temperature 97.6 F (36.4 C), temperature source Oral, resp. rate 16, height 5' 3"  (1.6 m), weight 119.8 kg, SpO2 98 %. GEN: Chronically ill appaering, in wheelchair ENT: Temporal wasting EYES: Scleral icterus present CV: Regular, nl s1s2 PULM: diminished throughout, esp in bases ABD: severely distended, soft, nt SKIN: diffuse anasarca of abd and legs, spontaneious weeping EXT:4+ weeping LEE into trunk  A 1. AKI, oliguric, poor response to  diuretics; Low UNa, absence of proteinuria or sig hematuria make HRS1 most likely; (triggered by CVA and contrast exposure and or UTI -- 2. Hypervolemia/Anasarca  3. NAFLD cirrhosis, decompensated; follows [facility] with [facility]; GI following. For diagnostic paracentesis today 4. 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 Dm2 6. Klebsiella and enterococcal UTI on Augmention 7. Anemia, patient is Jehovah's Witness 8.   P . Cont albumin 25g q6h for 48h, then daily albumin 25-50g; would stop this if concern for pulmonary edema . Continue octreotide and midodrine . Try to avoid use of diuretics  . Given severity of cirrhosis she is a poor candidate for dialysis therapies; would not be beneficial . Discussed hospice and palliative medicine with patient, will involve palliative care  . cont Na and fluid restriction . Daily weights, Daily Renal Panel, Strict I/Os, Avoid nephrotoxins (NSAIDs, judicious IV Contrast)   Pearson Grippe MD 03/25/2019, 12:33 PM  Recent Labs  Lab 03/23/19 0937 02/27/2019 0415 03/25/19 0737  NA 133* 134* 135  K 3.2* 3.4* 3.7  CL 104 103 103  CO2 20* 20* 21*  GLUCOSE 189* 173* 189*  BUN 78* 79* 82*  CREATININE 2.56* 2.75* 3.33*  CALCIUM 8.2* 8.6* 8.8*   Recent Labs  Lab 03/19/19 0610 02/25/2019 0415 03/25/19 0737  WBC 6.7 6.3 4.1  NEUTROABS 4.0  --   --   HGB 7.9* 7.1* 6.5*  HCT RESULTS UNAVAILABLE DUE TO INTERFERING SUBSTANCE 19.0* 17.4*  MCV RESULTS UNAVAILABLE DUE TO INTERFERING SUBSTANCE 70.9* 71.3*  PLT 64* 62* 57*

## 2019-03-25 NOTE — Consult Note (Signed)
Consultation Note Date: 03/25/2019   Patient Name: Monica Pearson  DOB: August 22, 1946  MRN: [id number]  Age / Sex: 73 y.o., female  PCP: Jenel Lucks, PA-C Referring Physician: Caren Griffins, MD  Reason for Consultation: Establishing goals of care  HPI/Patient Profile: 73 y.o. female  with past medical history of recent L MCA CVA s/p L ICA  thrombectomy, subdural hematoma, HTN, HLD, diabetes, sarcoidosis, NASH liver cirrhosis admitted on 03/06/2019 from [facility] with anasarca and oliguria. She has progressed with renal failure and hepatorenal syndrome. She continues to decline.   Clinical Assessment and Goals of Care: I met today with Monica Pearson. When I entered her room she has just recently vomited and has labored breathing with tachypnea and mod distress. I worked with RN/NT to get her cleaned up and zofran.   After she was cleaned and settled we were able to discuss [place]. She is asking what the next steps are for her for a palliative care path. We discussed focus on comfort and providing relief from her symptoms (nausea, breathing, etc) recognizing that this relief is not reversing her conditions but to make her feel better. We discussed that we can begin this process today and she agrees this is what she wants. She understands that her prognosis is poor and that she is declining. We then discussed where this care takes place here vs hospice and she tells me very clearly that she wishes to go to the hospice facility in [facility]. I also clarified her desire for DNR given the goal of comfort and order was placed. Emotional support provided.   I spoke with Dorita Fray and another son. We had a long conversation regarding my above discussion as well as her medical condition. They had many questions regarding any other options such as transplant and dialysis but understand that these are not good options for their mother as  her medical issues are complex and she has declined even since this morning. They understand that her prognosis is very poor and time very limited. In the end they also agree and respect any decisions that their mother has made. All questions/concerns addressed.   I discussed with nursing administration and updated Shad (he conferenced a different brother on phone this time) that 4 family members will be allowed to visit with her at their convenience. Allowed the other son to voice any questions or concerns as well.   Emotional support provided.   Primary Decision Maker PATIENT although she is quickly declining    SUMMARY OF RECOMMENDATIONS   - DNR, full comfort care - Declining rapidly and has decided to proceed with comfort care - Plans for transition to hospice if stable to move  Code Status/Advance Care Planning:  DNR   Symptom Management:   SOB: Dilaudid 0.5 mg IV STAT and every 30 min prn. May use for pain as well.   Nausea: Zofran 4 mg IV every 6 hours scheduled. Phenergan 6.25 mg IV every 6 hours prn for refractory. Continue octreotide given nausea for now  as it could contribute to her comfort and symptom management.   Secretions/overload: Robinul 0.4 mg IV every 4 hours.   Palliative Prophylaxis:   Aspiration, Frequent Pain Assessment, Oral Care and Turn Reposition  Additional Recommendations (Limitations, Scope, Preferences):  Full Comfort Care  Psycho-social/Spiritual:   Desire for further Chaplaincy support:yes  Additional Recommendations: Caregiving  Support/Resources, Education on Hospice and Grief/Bereavement Support  Prognosis:   Hours - Days  Discharge Planning: Hospice facility      Primary Diagnoses: Present on Admission: . Hepatorenal syndrome ([place]) . Acute renal failure superimposed on stage 3 chronic kidney disease ([place]) . Ascites . (Resolved) Hepatic cirrhosis ([place]) . Hypertension . Left middle cerebral artery stroke ([place]) . Liver  cirrhosis secondary to NASH [facility])   I have reviewed the medical record, interviewed the patient and family, and examined the patient. The following aspects are pertinent.  Past Medical History:  Diagnosis Date  . Cirrhosis ([place])   . Diabetes mellitus without complication ([place])   . Diabetic retinopathy ([place])   . Diabetic retinopathy ([place])   . GERD (gastroesophageal reflux disease)   . High cholesterol   . Hypertension   . Patient is Jehovah's Witness   . Sarcoidosis   . Stroke ([place])   . Subdural hematoma ([facility])    Social History   Socioeconomic History  . Marital status: Legally Separated    Spouse name: Not on file  . Number of children: Not on file  . Years of education: Not on file  . Highest education level: Not on file  Occupational History  . Not on file  Social Needs  . Financial resource strain: Not on file  . Food insecurity:    Worry: Not on file    Inability: Not on file  . Transportation needs:    Medical: Not on file    Non-medical: Not on file  Tobacco Use  . Smoking status: Former Research scientist (life sciences)  . Smokeless tobacco: Never Used  Substance and Sexual Activity  . Alcohol use: No  . Drug use: No  . Sexual activity: Not on file  Lifestyle  . Physical activity:    Days per week: Not on file    Minutes per session: Not on file  . Stress: Not on file  Relationships  . Social connections:    Talks on phone: Not on file    Gets together: Not on file    Attends religious service: Not on file    Active member of club or organization: Not on file    Attends meetings of clubs or organizations: Not on file    Relationship status: Not on file  Other Topics Concern  . Not on file  Social History Narrative  . Not on file   Family History  Problem Relation Age of Onset  . Stroke Mother   . Bladder Cancer Mother   . CAD Father   . Prostate cancer Father    Scheduled Meds: . sodium chloride   Intravenous Once  . glycopyrrolate  0.4 mg Intravenous Q4H  .  ipratropium-albuterol  3 mL Nebulization Q6H  . lactulose  10 g Oral BID  . midodrine  10 mg Oral TID WC  . pantoprazole  40 mg Oral Q1200  . sodium chloride flush  10-40 mL Intracatheter Q12H   Continuous Infusions: . octreotide  (SANDOSTATIN)    IV infusion 50 mcg/hr (03/25/19 0904)   PRN Meds:.acetaminophen **OR** acetaminophen, calcium carbonate (dosed in mg elemental calcium), camphor-menthol **AND** hydrOXYzine, docusate sodium,  feeding supplement (NEPRO CARB STEADY), HYDROmorphone (DILAUDID) injection, ondansetron **OR** ondansetron (ZOFRAN) IV, sodium chloride flush, sorbitol Allergies  Allergen Reactions  . Celecoxib Swelling    Ankles swell  . Rofecoxib Swelling    Ankles swell  . Ciprofloxacin Itching  . Darvon [Propoxyphene] Nausea And Vomiting  . Hydrocodone-Acetaminophen Nausea And Vomiting  . Morphine Nausea And Vomiting   Review of Systems  Constitutional: Positive for activity change, appetite change and fatigue.  Respiratory: Positive for shortness of breath.   Gastrointestinal: Positive for nausea and vomiting.  Neurological: Positive for weakness.    Physical Exam Vitals signs and nursing note reviewed.  Constitutional:      General: She is in acute distress.     Appearance: She is morbidly obese. She is ill-appearing.  Cardiovascular:     Rate and Rhythm: Normal rate.  Pulmonary:     Effort: Tachypnea, accessory muscle usage and respiratory distress present.     Breath sounds: Rales present.  Neurological:     Mental Status: She is alert and oriented to person, place, and time.     Vital Signs: BP 99/66 (BP Location: Left Arm)   Pulse 76   Temp 97.6 F (36.4 C) (Oral)   Resp 16   Ht _0  (1.6 m)   Wt 119.8 kg   SpO2 99%   BMI 46.79 kg/m  Pain Scale: 0-10   Pain Score: 0-No pain   SpO2: SpO2: 99 % O2 Device:SpO2: 99 % O2 Flow Rate: .   IO: Intake/output summary:   Intake/Output Summary (Last 24 hours) at 03/25/2019 1605 Last data  filed at 03/25/2019 0657 Gross per 24 hour  Intake 1164.01 ml  Output 305 ml  Net 859.01 ml    LBM: Last BM Date: 03/21/2019 Baseline Weight: Weight: 118.3 kg Most recent weight: Weight: 119.8 kg     Palliative Assessment/Data:     Time In: 1400 Time Out: 1515 Time Total: 75 min Greater than 50%  of this time was spent counseling and coordinating care related to the above assessment and plan.  Signed by: Vinie Sill, NP Palliative Medicine Team Pager # [phone] (M-F 8a-5p) Team Phone # [phone] (Nights/Weekends)

## 2019-03-25 NOTE — Progress Notes (Addendum)
RT Note:  Came into patient room to give patient breathing treatment, patient was not very responsive and began to throw up.  Called the RN to room, RN said she would see what patient could get for nausea.  Patient had emesis X3 while in room.  Suctioned patient, and proceeded to give breathing treatment.  Was unaware that patient is now palliative.  Since patient has been throwing up quite frequently, and at this time, according to RN, cannot have anything for nausea, will not place patient on CPAP.  Will continue to monitor patient.

## 2019-03-26 LAB — GLUCOSE, CAPILLARY
Glucose-Capillary: 137 mg/dL — ABNORMAL HIGH (ref 70–99)
Glucose-Capillary: 235 mg/dL — ABNORMAL HIGH (ref 70–99)

## 2019-03-26 NOTE — Progress Notes (Signed)
Palliative Medicine RN Note: Symptom check. RR 14. Pt grunting some but not moaning. Resp unlabored. Secretions audible despite scheduled Robinul.   Discussed pt with RN; encouraged turning her on side if possible to help with secretions & reviewed available meds for n/v.  Marjie Skiff. Tameron Lama, RN, BSN, [facility] Palliative Medicine Team 03/26/2019 3:29 PM Office [phone]

## 2019-03-26 NOTE — Progress Notes (Signed)
PROGRESS NOTE  Monica Pearson ZJI:[id number] DOB: 1946-08-24 DOA: 02/28/2019 PCP: Jenel Lucks, PA-C   LOS: 2 days   Brief Narrative / Interim history: 73 year old female with history of liver cirrhosis due to [facility], hypertension, hyperlipidemia, recent CVA for which she was hospitalized in inpatient rehab up until 03/05/2019, subdural hematoma on 1/20, is readmitted to the hospital from inpatient rehab due to progressive renal failure, significant fluid overload / anasarca and progressive weakness  Subjective: -Unresponsive this morning  Assessment & Plan: Principal Problem:   Hepatorenal syndrome ([place]) Active Problems:   Insulin dependent diabetes mellitus ([place])   Hypertension   Obstructive sleep apnea treated with continuous positive airway pressure (CPAP)   Liver cirrhosis secondary to NASH (HCC)   Ascites   Acute renal failure superimposed on stage 3 chronic kidney disease (HCC)   Left middle cerebral artery stroke ([place])   Goals of care, counseling/discussion   DNR (do not resuscitate)   Palliative care encounter   Principal Problem Acute kidney injury due to hepatorenal syndrome underlying chronic kidney disease stage III -Patient here is a very poor prognosis, she has progressive worsening renal failure in the setting of underlying liver cirrhosis.  Nephrology and gastroenterology consulted and are following.  Given progressive renal failure in the setting of end-stage liver disease palliative was consulted.  After discussion with the patient and family, she will be transitioned to comfort care.  She declined significantly in the last 48 hours, anticipating hospital death.  Active Problems Liver cirrhosis secondary to [facility] with ascites -This appears to be decompensated with anasarca/fluid overload as well as hepatorenal syndrome  Anemia of chronic kidney disease -Hemoglobin worsening, this is likely multifactorial in the setting of chronic kidney disease as well as  fluid overload, patient is a [employer]hov[employer]'s Witness and is refusing blood transfusions.   Recent Klebsiella and enterococcus UTI -She was placed on Augmentin but seems to have finished already as cultures were done about 10 days prior to this admission  Hypotension, history of hypertension IDDM -Continue sliding scale OSA on CPAP  Scheduled Meds:  sodium chloride   Intravenous Once   glycopyrrolate  0.4 mg Intravenous Q4H   lactulose  10 g Oral BID   midodrine  10 mg Oral TID WC   ondansetron (ZOFRAN) IV  4 mg Intravenous Q6H   sodium chloride flush  10-40 mL Intracatheter Q12H   Continuous Infusions:  octreotide  (SANDOSTATIN)    IV infusion 50 mcg/hr (03/26/19 0655)   PRN Meds:.acetaminophen **OR** acetaminophen, calcium carbonate (dosed in mg elemental calcium), camphor-menthol **AND** hydrOXYzine, docusate sodium, feeding supplement (NEPRO CARB STEADY), HYDROmorphone (DILAUDID) injection, ipratropium-albuterol, promethazine, sodium chloride flush, sorbitol  DVT prophylaxis: SCDs Code Status: Full code Family Communication: no family at bedside Disposition Plan: TBD  Consultants:   Nephrology  GI  Palliative care   Procedures:  Paracentesis 200 mL  Antimicrobials:  None    Objective: Vitals:   03/03/2019 2322 03/25/19 0500 03/25/19 1340 03/25/19 2138  BP:      Pulse: 76     Resp: 16     Temp:      TempSrc:      SpO2: 98%  99% 95%  Weight:  119.8 kg    Height:        Intake/Output Summary (Last 24 hours) at 03/26/2019 1230 Last data filed at 03/26/2019 0940 Gross per 24 hour  Intake 236.46 ml  Output --  Net 236.46 ml   Filed Weights   03/21/2019 1708 03/25/19 0500  Weight: 118.3 kg 119.8 kg    Examination:  Constitutional: Unresponsive Respiratory: Coarse breath sounds  Cardiovascular: RRR  Data Reviewed: I have independently reviewed following labs and imaging studies   CBC: Recent Labs  Lab 03/02/2019 0415 03/25/19 0737  WBC 6.3 4.1  HGB  7.1* 6.5*  HCT 19.0* 17.4*  MCV 70.9* 71.3*  PLT 62* 57*   Basic Metabolic Panel: Recent Labs  Lab 03/23/19 0937 02/28/2019 0415 03/25/19 0737  NA 133* 134* 135  K 3.2* 3.4* 3.7  CL 104 103 103  CO2 20* 20* 21*  GLUCOSE 189* 173* 189*  BUN 78* 79* 82*  CREATININE 2.56* 2.75* 3.33*  CALCIUM 8.2* 8.6* 8.8*   GFR: Estimated Creatinine Clearance: 18.9 mL/min (A) (by C-G formula based on SCr of 3.33 mg/dL (H)). Liver Function Tests: Recent Labs  Lab 03/23/19 1730 03/18/2019 0415 03/25/19 0737  AST 70* 58* 51*  ALT 62* 52* 42  ALKPHOS 232* 221* 161*  BILITOT 3.0* 2.7* 3.3*  PROT 5.7* 5.2* 5.5*  ALBUMIN 2.3* 2.4* 3.2*   No results for input(s): LIPASE, AMYLASE in the last 168 hours. Recent Labs  Lab 03/22/19 2333  AMMONIA 44*   Coagulation Profile: Recent Labs  Lab 03/20/19 0310 03/21/19 0350 03/22/19 0423 03/23/19 0404 03/03/2019 0415  INR 1.5* 1.6* 1.6* 1.6* 1.6*   Cardiac Enzymes: No results for input(s): CKTOTAL, CKMB, CKMBINDEX, TROPONINI in the last 168 hours. BNP (last 3 results) No results for input(s): PROBNP in the last 8760 hours. HbA1C: No results for input(s): HGBA1C in the last 72 hours. CBG: Recent Labs  Lab 03/02/2019 1127 03/13/2019 1649 02/26/2019 2139 03/25/19 0803 03/25/19 1240  GLUCAP 153* 156* 182* 181* 279*   Lipid Profile: No results for input(s): CHOL, HDL, LDLCALC, TRIG, CHOLHDL, LDLDIRECT in the last 72 hours. Thyroid Function Tests: No results for input(s): TSH, T4TOTAL, FREET4, T3FREE, THYROIDAB in the last 72 hours. Anemia Panel: No results for input(s): VITAMINB12, FOLATE, FERRITIN, TIBC, IRON, RETICCTPCT in the last 72 hours. Urine analysis:    Component Value Date/Time   COLORURINE YELLOW 03/15/2019 0521   APPEARANCEUR HAZY (A) 02/26/2019 0521   LABSPEC 1.014 02/27/2019 0521   PHURINE 5.0 03/07/2019 0521   GLUCOSEU NEGATIVE 03/17/2019 0521   HGBUR SMALL (A) 03/22/2019 0521   BILIRUBINUR NEGATIVE 03/17/2019 0521    KETONESUR NEGATIVE 03/05/2019 0521   PROTEINUR NEGATIVE 02/27/2019 0521   UROBILINOGEN 0.2 02/23/2015 2000   NITRITE NEGATIVE 03/14/2019 0521   LEUKOCYTESUR TRACE (A) 03/07/2019 0521   Sepsis Labs: Invalid input(s): PROCALCITONIN, LACTICIDVEN  Recent Results (from the past 240 hour(s))  Culture, body fluid-bottle     Status: None (Preliminary result)   Collection Time: 03/16/2019  1:59 PM  Result Value Ref Range Status   Specimen Description PERITONEAL  Final   Special Requests NONE  Final   Culture   Final    NO GROWTH < 24 HOURS Performed at [facility], [address], [place], [place] [address]    Report Status PENDING  Incomplete  Gram stain     Status: None   Collection Time: 03/05/2019  1:59 PM  Result Value Ref Range Status   Specimen Description PERITONEAL  Final   Special Requests NONE  Final   Gram Stain   Final    FEW WBC PRESENT,BOTH PMN AND MONONUCLEAR NO ORGANISMS SEEN Performed at [facility], [address] [place], [place] [address]    Report Status 03/23/2019 FINAL  Final  Radiology Studies: Dg Abd 1 View  Result Date: 03/04/2019 CLINICAL DATA:  Palpable firmness in the upper abdomen on clinical evaluation. EXAM: Portable ABDOMEN-1 VIEW COMPARISON:  03/18/2019. FINDINGS: Normal bowel gas pattern. Moderate stool in the transverse colon. Surgical clips in the RIGHT UPPER QUADRANT from prior cholecystectomy. Degenerative changes involving the lumbar spine. IMPRESSION: No acute abdominal abnormality. Electronically Signed   By: Evangeline Dakin M.D.   On: 03/16/2019 14:51   Dg Chest Port 1 View  Result Date: 03/25/2019 CLINICAL DATA:  Dyspnea, wheezing EXAM: PORTABLE CHEST 1 VIEW COMPARISON:  03/17/2019 FINDINGS: Diffuse bilateral mild interstitial thickening. No pleural effusion or pneumothorax. Stable cardiomediastinal silhouette. Right-sided PICC line with the tip projecting over the SVC. No acute osseous abnormality. IMPRESSION: 1. Bilateral  mild interstitial thickening likely reflecting mild interstitial edema versus infection. Electronically Signed   By: Kathreen Devoid   On: 03/25/2019 12:49   Ir Paracentesis  Result Date: 02/28/2019 INDICATION: Patient with history of cirrhosis and ascites. Request is made for diagnostic paracentesis. EXAM: ULTRASOUND GUIDED DIAGNOSTIC PARACENTESIS MEDICATIONS: 10 mL 1% lidocaine COMPLICATIONS: None immediate. PROCEDURE: Informed written consent was obtained from the patient after a discussion of the risks, benefits and alternatives to treatment. A timeout was performed prior to the initiation of the procedure. Initial ultrasound scanning demonstrates a tiny amount of ascites within the right abdomen. The right lower abdomen was prepped and draped in the usual sterile fashion. 1% lidocaine was used for local anesthesia. Following this, a Safe-T-Centesis catheter was directed into the ascites using ultrasound guidance. However, minimal fluid could be aspirated. Therefore, the Safe-T-Centesis catheter was removed. 19 gauge, 15-cm, Yueh catheter was introduced by Dr. Anselm Pancoast using ultrasound guidance. An ultrasound image was saved for documentation purposes. The paracentesis was performed. The catheter was removed and a dressing was applied. The patient tolerated the procedure well without immediate post procedural complication. FINDINGS: A total of approximately 200 mL of clear yellow fluid was removed. Samples were sent to the laboratory as requested by the clinical team. IMPRESSION: Successful ultrasound-guided paracentesis yielding 200 mL of peritoneal fluid. Read by: Earley Abide, PA-C Electronically Signed   By: Markus Daft M.D.   On: 03/20/2019 14:13    Marzetta Board, MD, PhD [facility]  Contact via  [email or web address]  [place] P: [phone]  F: [phone]

## 2019-03-26 NOTE — Progress Notes (Signed)
[place]: Referral received from Kathlee Nations SW this am. Discussed hospice with son and our MD Dr. Ander Purpura. Pt was approved for admission to the inpatient unit. Spoke to [facility] NP Alisha and she feels pt may be to unstable to move. Will hold referral for now and if needed please call us. Yetta Glassman will be working this weekend [phone].  [facility] Liaison for [place].

## 2019-03-26 NOTE — Progress Notes (Addendum)
Palliative:  Monica Pearson is unresponsive this morning. She appears comfortable but has had had multiple episodes of vomiting last night per RN. Unclear why she was not given Phenergan that was ordered PRN - educated nursing.   I spoke further with son, Monica Pearson, and we discussed that she is likely not very stable for transfer at this stage. I worry how she would physically tolerate a transfer. Son agrees with my assessment and wants what is best for his mother. They plan to visit more with her today to continue saying their goodbyes. All questions/concerns addressed. Emotional support provided.   Exam: Unresponsive. No distress.   Plan: - Significant decline over the past 24-48 hours. Anticipate hospital death s/t hepatorenal syndrome.  - SOB: Dilaudid 0.5 mg IV every 30 min prn. May use for pain as well.  - Nausea: Zofran 4 mg IV every 6 hours scheduled. Phenergan 6.25 mg IV every 6 hours prn for refractory. Continue octreotide given nausea for now as it could contribute to her comfort and symptom management.  - Secretions/overload: Robinul 0.4 mg IV every 4 hours.   25 min   Vinie Sill, NP Palliative Medicine Team Pager # [phone] (M-F 8a-5p) Team Phone # [phone] (Nights/Weekends)

## 2019-03-26 NOTE — TOC Initial Note (Signed)
Transition of Care [facility]) - Initial/Assessment Note    Patient Details  Name: Monica Pearson MRN: [id number] Date of Birth: [birth year]  [facility] CM/SW Contact:    Geralynn Ochs, LCSW Phone Number: 03/26/2019, 11:21 AM  Clinical Narrative: CSW acknowledging consult for transition to [place]. CSW spoke with Cheri with [facility] this morning to send referral, and [facility] indicated that patient's son, Dorita Fray, had already been in contact with them several times between last night and today inquiring about his mom's transfer. CSW received a call back later this morning from Cheri that she was contacted by patient's son again that patient is not currently stable for transfer, he had received a call from PMT NP. Patient has been approved and accepted to hospice home if she stabilizes for transport to admit to residential hospice facility. CSW to follow.               Expected Discharge Plan: [place] Barriers to Discharge: Continued Medical Work up   Patient Goals and CMS Choice Patient states their goals for this hospitalization and ongoing recovery are:: comfort and relief from uncomfortable symptoms      Expected Discharge Plan and Services Expected Discharge Plan: [place] Acute Care Choice: Hospice Living arrangements for the past 2 months: Single Family Home                                      Prior Living Arrangements/Services Living arrangements for the past 2 months: Single Family Home Lives with:: Self                   Activities of Daily Living Home Assistive Devices/Equipment: Eyeglasses ADL Screening (condition at time of admission) Patient's cognitive ability adequate to safely complete daily activities?: No Is the patient deaf or have difficulty hearing?: No Does the patient have difficulty seeing, even when wearing glasses/contacts?: No Does the patient have difficulty  concentrating, remembering, or making decisions?: Yes Patient able to express need for assistance with ADLs?: Yes Does the patient have difficulty dressing or bathing?: No Independently performs ADLs?: Yes (appropriate for developmental age) Communication: Independent Dressing (OT): Independent Is this a change from baseline?: Pre-admission baseline Grooming: Independent Is this a change from baseline?: Pre-admission baseline Feeding: Independent Bathing: Independent Is this a change from baseline?: Pre-admission baseline Toileting: Independent Is this a change from baseline?: Pre-admission baseline In/Out Bed: Independent Is this a change from baseline?: Pre-admission baseline Walks in Home: Independent Is this a change from baseline?: Pre-admission baseline Does the patient have difficulty walking or climbing stairs?: Yes Weakness of Legs: Both Weakness of Arms/Hands: None  Permission Sought/Granted                  Emotional Assessment              Admission diagnosis:  cirosis Patient Active Problem List   Diagnosis Date Noted  . Goals of care, counseling/discussion   . DNR (do not resuscitate)   . Palliative care encounter   . Hepatorenal syndrome ([place]) 03/23/2019  . Abdominal distension   . Acute renal failure superimposed on stage 3 chronic kidney disease ([place]) 03/18/2019  . Left middle cerebral artery stroke ([place]) 03/18/2019  . AKI (acute kidney injury) ([place])   . SDH (subdural hematoma) ([place])   . Dyslipidemia   .  Tobacco abuse   . Acute blood loss anemia   . Acute respiratory failure ([place])   . Endotracheally intubated 03/15/2019  . 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 Bacteriuria with pyuria 03/15/2019  . Bradycardia 03/15/2019  . CKD stage 3 due to type 2 diabetes mellitus ([place]) 03/15/2019  . Type 2 diabetes mellitus with complication, with long-term current use of insulin ([place]) 03/15/2019  . Microcytic anemia 03/15/2019  . Thrombocytopenia  ([place]) 03/15/2019  . Compromised airway   . Stroke (cerebrum) ([place]) 03/14/2019  . Stenosis of left carotid artery 03/14/2019  . Ascites   . Hypomagnesemia   . Hypokalemia   . Liver cirrhosis secondary to NASH ([place])   . Hyperammonemia ([place]) 01/14/2019  . Subdural hematoma ([place]) 01/14/2019  . Type 2 diabetes mellitus with vascular disease ([place]) 01/14/2019  . Encephalopathy, hepatic ([place]) 01/14/2019  . Acute metabolic encephalopathy 70/17/7939  . Hyperlipidemia 07/22/2017  . Hypertension 07/22/2017  . Obstructive sleep apnea treated with continuous positive airway pressure (CPAP) 07/22/2017  . Acute CVA (cerebrovascular accident) ([place]) 12/15/2016  . TIA (transient ischemic attack) 12/14/2016  . GERD (gastroesophageal reflux disease) 12/14/2016  . Insulin dependent diabetes mellitus ([place]) 12/14/2016  . No blood products 12/14/2016   PCP:  Jenel Lucks, PA-C Pharmacy:   [place], [place] - [address] [place] [address] Phone: [phone] Fax: [place], [place] [address] [place] [address] [place] [address] Phone: [phone] Fax: [phone]     Social Determinants of Health (SDOH) Interventions    Readmission Risk Interventions Readmission Risk Prevention Plan 03/25/2019  Transportation Screening Complete  Medication Review (RN Care Manager) Complete  Palliative Care Screening Complete  Some recent data might be hidden

## 2019-03-26 DEATH — deceased

## 2019-03-27 DIAGNOSIS — R6889 Other general symptoms and signs: Secondary | ICD-10-CM

## 2019-03-27 DIAGNOSIS — R1110 Vomiting, unspecified: Secondary | ICD-10-CM

## 2019-03-27 DIAGNOSIS — R112 Nausea with vomiting, unspecified: Secondary | ICD-10-CM

## 2019-03-27 DIAGNOSIS — G253 Myoclonus: Secondary | ICD-10-CM

## 2019-03-27 DIAGNOSIS — Z515 Encounter for palliative care: Secondary | ICD-10-CM

## 2019-03-27 MED ORDER — HYDROMORPHONE HCL 1 MG/ML IJ SOLN
1.0000 mg | INTRAMUSCULAR | Status: DC | PRN
  Administered 2019-03-28 (×4): 1 mg via INTRAVENOUS
  Filled 2019-03-27 (×4): qty 1

## 2019-03-27 MED ORDER — LORAZEPAM 2 MG/ML IJ SOLN
0.5000 mg | INTRAMUSCULAR | Status: DC
  Administered 2019-03-27 – 2019-03-28 (×4): 0.5 mg via INTRAVENOUS
  Filled 2019-03-27 (×4): qty 1

## 2019-03-27 MED ORDER — SCOPOLAMINE 1 MG/3DAYS TD PT72
1.0000 | MEDICATED_PATCH | TRANSDERMAL | Status: DC
  Administered 2019-03-27: 1.5 mg via TRANSDERMAL
  Filled 2019-03-27: qty 1

## 2019-03-27 NOTE — Progress Notes (Signed)
  Palliative medicine progress note  Patient seen, chart reviewed.  Staffed with bedside RN.  When patient seen, she still has copious secretions despite Robinul 0.4 mg every 4 hours around-the-clock.  She also has involuntary motor movements to her head and upper extremities  She is unresponsive, afebrile, tachycardic at 104, respiratory rate in the 20s.  She did have some episodes of vomiting when being turned this morning, according to RN. Per chart review, her hemoglobin is declining as of 03/25/2019, 6.5, platelets 57.  No family at the bedside.  Per RN, family has been allowed in to visit and are aware of her current clinical condition  Plan  Continue Dilaudid 0.5 mg every 30 minutes as needed.  Per chart review no PRN's given overnight.  Monitor for need for scheduled dosing Continue Robinul 0.4 mg every 4 hours around-the-clock.  We will add scopolamine transdermal for additional management of secretions.  This also may help nausea. Continue scheduled Zofran and octreotide for nausea.  Will add scopolamine for secretions and this may also help with her emesis We will add Ativan 0.5 mg every 4 hours on a scheduled basis for mild clonus  Prognosis: Hours to days  Disposition: Anticipate hospital death  Thank you, Romona Curls, NP Total time spent: 25 minutes Greater than 50% of this visit was directed at counseling and coordination of care

## 2019-03-27 NOTE — Progress Notes (Signed)
PROGRESS NOTE  Monica Pearson QTM:[id number] DOB: 1946/04/16 DOA: 03/23/2019 PCP: Jenel Lucks, PA-C   LOS: 3 days   Brief Narrative / Interim history: 73 year old female with history of liver cirrhosis due to [facility], hypertension, hyperlipidemia, recent CVA for which she was hospitalized in inpatient rehab up until 03/09/2019, subdural hematoma on 1/20, is readmitted to the hospital from inpatient rehab due to progressive renal failure, significant fluid overload / anasarca and progressive weakness  Subjective: -Remains unresponsive  Assessment & Plan: Principal Problem:   Hepatorenal syndrome ([place]) Active Problems:   Insulin dependent diabetes mellitus ([place])   Hypertension   Obstructive sleep apnea treated with continuous positive airway pressure (CPAP)   Liver cirrhosis secondary to NASH (HCC)   Ascites   Acute renal failure superimposed on stage 3 chronic kidney disease (HCC)   Left middle cerebral artery stroke ([place])   Goals of care, counseling/discussion   DNR (do not resuscitate)   Palliative care encounter   Principal Problem Acute kidney injury due to hepatorenal syndrome underlying chronic kidney disease stage III -Patient here is a very poor prognosis, she has progressive worsening renal failure in the setting of underlying liver cirrhosis.  Nephrology and gastroenterology consulted and are following.  Given progressive renal failure in the setting of end-stage liver disease palliative was consulted.  After discussion with the patient and family, she will be transitioned to comfort care.  She declined significantly in the last 48 hours, anticipating hospital death.  Active Problems Liver cirrhosis secondary to [facility] with ascites -This appears to be decompensated with anasarca/fluid overload as well as hepatorenal syndrome  Anemia of chronic kidney disease -Hemoglobin worsening, this is likely multifactorial in the setting of chronic kidney disease as well as fluid  overload, patient is a Jehovah's Witness and is refusing blood transfusions.   Recent Klebsiella and enterococcus UTI -She was placed on Augmentin but seems to have finished already as cultures were done about 10 days prior to this admission  Hypotension, history of hypertension IDDM -Continue sliding scale OSA on CPAP  Scheduled Meds: . sodium chloride   Intravenous Once  . glycopyrrolate  0.4 mg Intravenous Q4H  . ondansetron (ZOFRAN) IV  4 mg Intravenous Q6H  . sodium chloride flush  10-40 mL Intracatheter Q12H   Continuous Infusions: . octreotide  (SANDOSTATIN)    IV infusion 50 mcg/hr (03/27/19 0412)   PRN Meds:.docusate sodium, feeding supplement (NEPRO CARB STEADY), HYDROmorphone (DILAUDID) injection, ipratropium-albuterol, promethazine, sodium chloride flush  DVT prophylaxis: SCDs Code Status: Full code Family Communication: no family at bedside Disposition Plan: TBD  Consultants:   Nephrology  GI  Palliative care   Procedures:  Paracentesis 200 mL  Antimicrobials:  None    Objective: Vitals:   03/15/2019 2322 03/25/19 0500 03/25/19 1340 03/25/19 2138  BP:      Pulse: 76     Resp: 16     Temp:      TempSrc:      SpO2: 98%  99% 95%  Weight:  119.8 kg    Height:       No intake or output data in the 24 hours ending 03/27/19 1137 Filed Weights   03/14/2019 1708 03/25/19 0500  Weight: 118.3 kg 119.8 kg    Examination:  Constitutional: Unresponsive. Appears comfortable  Data Reviewed: I have independently reviewed following labs and imaging studies   CBC: Recent Labs  Lab 03/02/2019 0415 03/25/19 0737  WBC 6.3 4.1  HGB 7.1* 6.5*  HCT 19.0* 17.4*  MCV  70.9* 71.3*  PLT 62* 57*   Basic Metabolic Panel: Recent Labs  Lab 03/23/19 0937 03/13/2019 0415 03/25/19 0737  NA 133* 134* 135  K 3.2* 3.4* 3.7  CL 104 103 103  CO2 20* 20* 21*  GLUCOSE 189* 173* 189*  BUN 78* 79* 82*  CREATININE 2.56* 2.75* 3.33*  CALCIUM 8.2* 8.6* 8.8*   GFR:  Estimated Creatinine Clearance: 18.9 mL/min (A) (by C-G formula based on SCr of 3.33 mg/dL (H)). Liver Function Tests: Recent Labs  Lab 03/23/19 1730 03/09/2019 0415 03/25/19 0737  AST 70* 58* 51*  ALT 62* 52* 42  ALKPHOS 232* 221* 161*  BILITOT 3.0* 2.7* 3.3*  PROT 5.7* 5.2* 5.5*  ALBUMIN 2.3* 2.4* 3.2*   No results for input(s): LIPASE, AMYLASE in the last 168 hours. Recent Labs  Lab 03/22/19 2333  AMMONIA 44*   Coagulation Profile: Recent Labs  Lab 03/21/19 0350 03/22/19 0423 03/23/19 0404 03/16/2019 0415  INR 1.6* 1.6* 1.6* 1.6*   Cardiac Enzymes: No results for input(s): CKTOTAL, CKMB, CKMBINDEX, TROPONINI in the last 168 hours. BNP (last 3 results) No results for input(s): PROBNP in the last 8760 hours. HbA1C: No results for input(s): HGBA1C in the last 72 hours. CBG: Recent Labs  Lab 03/09/2019 1127 03/23/2019 1649 03/22/2019 2139 03/25/19 0803 03/25/19 1240  GLUCAP 153* 156* 182* 181* 279*   Lipid Profile: No results for input(s): CHOL, HDL, LDLCALC, TRIG, CHOLHDL, LDLDIRECT in the last 72 hours. Thyroid Function Tests: No results for input(s): TSH, T4TOTAL, FREET4, T3FREE, THYROIDAB in the last 72 hours. Anemia Panel: No results for input(s): VITAMINB12, FOLATE, FERRITIN, TIBC, IRON, RETICCTPCT in the last 72 hours. Urine analysis:    Component Value Date/Time   COLORURINE YELLOW 03/07/2019 0521   APPEARANCEUR HAZY (A) 03/25/2019 0521   LABSPEC 1.014 03/02/2019 0521   PHURINE 5.0 02/26/2019 0521   GLUCOSEU NEGATIVE 03/03/2019 0521   HGBUR SMALL (A) 02/28/2019 0521   BILIRUBINUR NEGATIVE 03/09/2019 0521   KETONESUR NEGATIVE 03/03/2019 0521   PROTEINUR NEGATIVE 03/20/2019 0521   UROBILINOGEN 0.2 02/23/2015 2000   NITRITE NEGATIVE 02/26/2019 0521   LEUKOCYTESUR TRACE (A) 03/16/2019 0521   Sepsis Labs: Invalid input(s): PROCALCITONIN, LACTICIDVEN  Recent Results (from the past 240 hour(s))  Culture, body fluid-bottle     Status: None (Preliminary  result)   Collection Time: 03/11/2019  1:59 PM  Result Value Ref Range Status   Specimen Description PERITONEAL  Final   Special Requests NONE  Final   Culture   Final    NO GROWTH 3 DAYS Performed at [facility], [address] [place], [place] [address]    Report Status PENDING  Incomplete  Gram stain     Status: None   Collection Time: 03/11/2019  1:59 PM  Result Value Ref Range Status   Specimen Description PERITONEAL  Final   Special Requests NONE  Final   Gram Stain   Final    FEW WBC PRESENT,BOTH PMN AND MONONUCLEAR NO ORGANISMS SEEN Performed at [facility], [address], [place], [place] [address]    Report Status 03/02/2019 FINAL  Final      Radiology Studies: Dg Chest Port 1 View  Result Date: 03/25/2019 CLINICAL DATA:  Dyspnea, wheezing EXAM: PORTABLE CHEST 1 VIEW COMPARISON:  03/17/2019 FINDINGS: Diffuse bilateral mild interstitial thickening. No pleural effusion or pneumothorax. Stable cardiomediastinal silhouette. Right-sided PICC line with the tip projecting over the SVC. No acute osseous abnormality. IMPRESSION: 1. Bilateral mild interstitial thickening  likely reflecting mild interstitial edema versus infection. Electronically Signed   By: Kathreen Devoid   On: 03/25/2019 12:49    Marzetta Board, MD, PhD [facility]  Contact via  [email or web address]  [place] P: [phone]  F: [phone]

## 2019-03-27 NOTE — Progress Notes (Signed)
Pt now comfort measures.  Palliative and primary following. Will sign off.

## 2019-03-28 MED ORDER — ORAL CARE MOUTH RINSE: 15.0000 mL | Freq: Two times a day (BID) | OROMUCOSAL | Status: DC

## 2019-03-28 MED ORDER — CHLORHEXIDINE GLUCONATE 0.12 % MT SOLN
15.0000 mL | Freq: Two times a day (BID) | OROMUCOSAL | Status: DC
  Administered 2019-03-28: 15 mL via OROMUCOSAL
  Filled 2019-03-28: qty 15

## 2019-03-29 ENCOUNTER — Inpatient Hospital Stay (HOSPITAL_COMMUNITY): Payer: Self-pay

## 2019-03-29 LAB — CULTURE, BODY FLUID W GRAM STAIN -BOTTLE: Culture: NO GROWTH

## 2019-04-26 NOTE — Progress Notes (Signed)
RN was in patient's room at 0630 giving patient Dilaudid IV. Patient hypotensive, still labored breathing. This RN called Donney Dice, patient's son, to notify him of worsening condition and recommending family come in if they wish. Son stated he was going to call family then call this RN back. As this RN was administering Dilaudid IV, patient became apneic. No VS noted at 0635, witnessed by Vickie Epley, RN. Bodenheimer, NP text paged. Donney Dice returned call to this RN, stating family will not be in to see patient. This RN notified son of patient's passing. Per son, patient's belonging have been retrieved, and anything left in room can be disposed of. Patient has been bathed. PICC line removed, foley catheter removed. Moist gauze over patient's eyes. No response from Bodenheimer,NP, so Denton Brick, MD text paged. Denton Brick, MD returned call and notified of patient's death. Jana Half, RN aware. Patient still on unit at this time.

## 2019-04-26 NOTE — Discharge Summary (Addendum)
Monica Pearson FIE:[id number] DOB: [birth year] DOA: 04-23-19  PCP: Monica Lucks, PA-C PCP/Office notified:   Admit date: 2019/04/23 Date of Death: 04-27-19  Final Diagnoses:  Principal Problem:   Hepatorenal syndrome ([place]) Active Problems:   Insulin dependent diabetes mellitus ([place])   Hypertension   Obstructive sleep apnea treated with continuous positive airway pressure (CPAP)   Liver cirrhosis secondary to NASH (HCC)   Ascites   Acute renal failure superimposed on stage 3 chronic kidney disease (HCC)   Left middle cerebral artery stroke (Monica Pearson)   Goals of care, counseling/discussion   DNR (do not resuscitate)   Palliative care encounter   Copious oral secretions   Myoclonus   Intractable vomiting   Palliative care by specialist  History of present illness:   Patient coming from:Rehab; lives alone Monica Pearson; [place], [phone]  Chief Complaint: anasarca  FUX:NATFT [name] medical history significant ofCVA; HTN; HLD;liver cirrhosis2* to NASH;and acute on chronic subdural hematoma in 1/20 who presented with code stroke on 4/19. She was found to have a L MCA stroke L LCA near occlusive thrombus and underwent arteriogram with mechanical thrombectomy per IR. She was discharged to CIR on 4/23.  Since she came to rehab she has been working really hard. She has been feeling okay but yesterday she got a little depressed. +ansarca - stomach, legs, thighs, feet. She does not feel like she is making any urine. No pain. Increased SOB with exertion. No confusion. Time of Death 04-27-19 at 0635 am   Hospital Course:  Brief Summary:- 73 year old female with history of liver cirrhosis due to Monica Pearson, hypertension, hyperlipidemia, recent CVA for which she was hospitalized in inpatient rehab up until 04/23/19, subdural hematoma on 1/20, is readmitted to the hospital from inpatient rehab due to progressive renal failure, significant fluid overload /  anasarca and progressive weakness.  After discussion with the patient and family, she will be transitioned to comfort care.  She declined significantly in the last 48 hours, Time of Death 04-27-2019 at 0635 am   A/p Acute kidney injury due to hepatorenal syndrome underlying chronic kidney disease stage III -Patient here is a very poor prognosis, she has progressive worsening renal failure in the setting of underlying liver cirrhosis.  Nephrology and gastroenterology consulted and are following.  Given progressive renal failure in the setting of end-stage liver disease palliative was consulted.  After discussion with the patient and family, she will be transitioned to comfort care.  She declined significantly in the last 48 hours, Time of Death 2019-04-27 at 0635 am  Active Problems Liver cirrhosis secondary to [facility] with ascites -This appears to be decompensated with anasarca/fluid overload as well as hepatorenal syndrome  Anemia of chronic kidney disease -Hemoglobin worsening, this is likely multifactorial in the setting of chronic kidney disease as well as fluid overload, patient is a Jehovah's Witness and is refusing blood transfusions.   Recent Klebsiella and enterococcus UTI -She was placed on Augmentin but seems to have finished already as cultures were done about 10 days prior to this admission  Hypotension, history of hypertension IDDM - was on sliding scale OSA  She was on CPAP  Time: Time of Death 2019-04-27 at 0635 am  Signed:  Pt passed away prior to the start of my day shift...so I did Not see or round on patient prior to her death  Signed: Arya Pearson  [facility] 2019-04-27, 8:10 AM

## 2019-04-26 NOTE — Progress Notes (Signed)
Bodenheimer, NP text paged earlier about patient's labored breathing, grunting, and need for O2 via [place]. Dilaudid increased and given x3 doses. Patient appears more comfortable at this time. Bodenheimer, NP verified that patient does not have to be on a tele monitor during Octreotide IV gtt d/t Comfort Care status. Will continue to monitor.

## 2019-04-26 DEATH — deceased

## 2019-05-07 IMAGING — DX PORTABLE CHEST - 1 VIEW
1 series · 1 of 1 positions shown · non-contrast
Comparison: 03/17/2019

CLINICAL DATA: Dyspnea, wheezing

EXAM:
PORTABLE CHEST 1 VIEW

[chest ap]
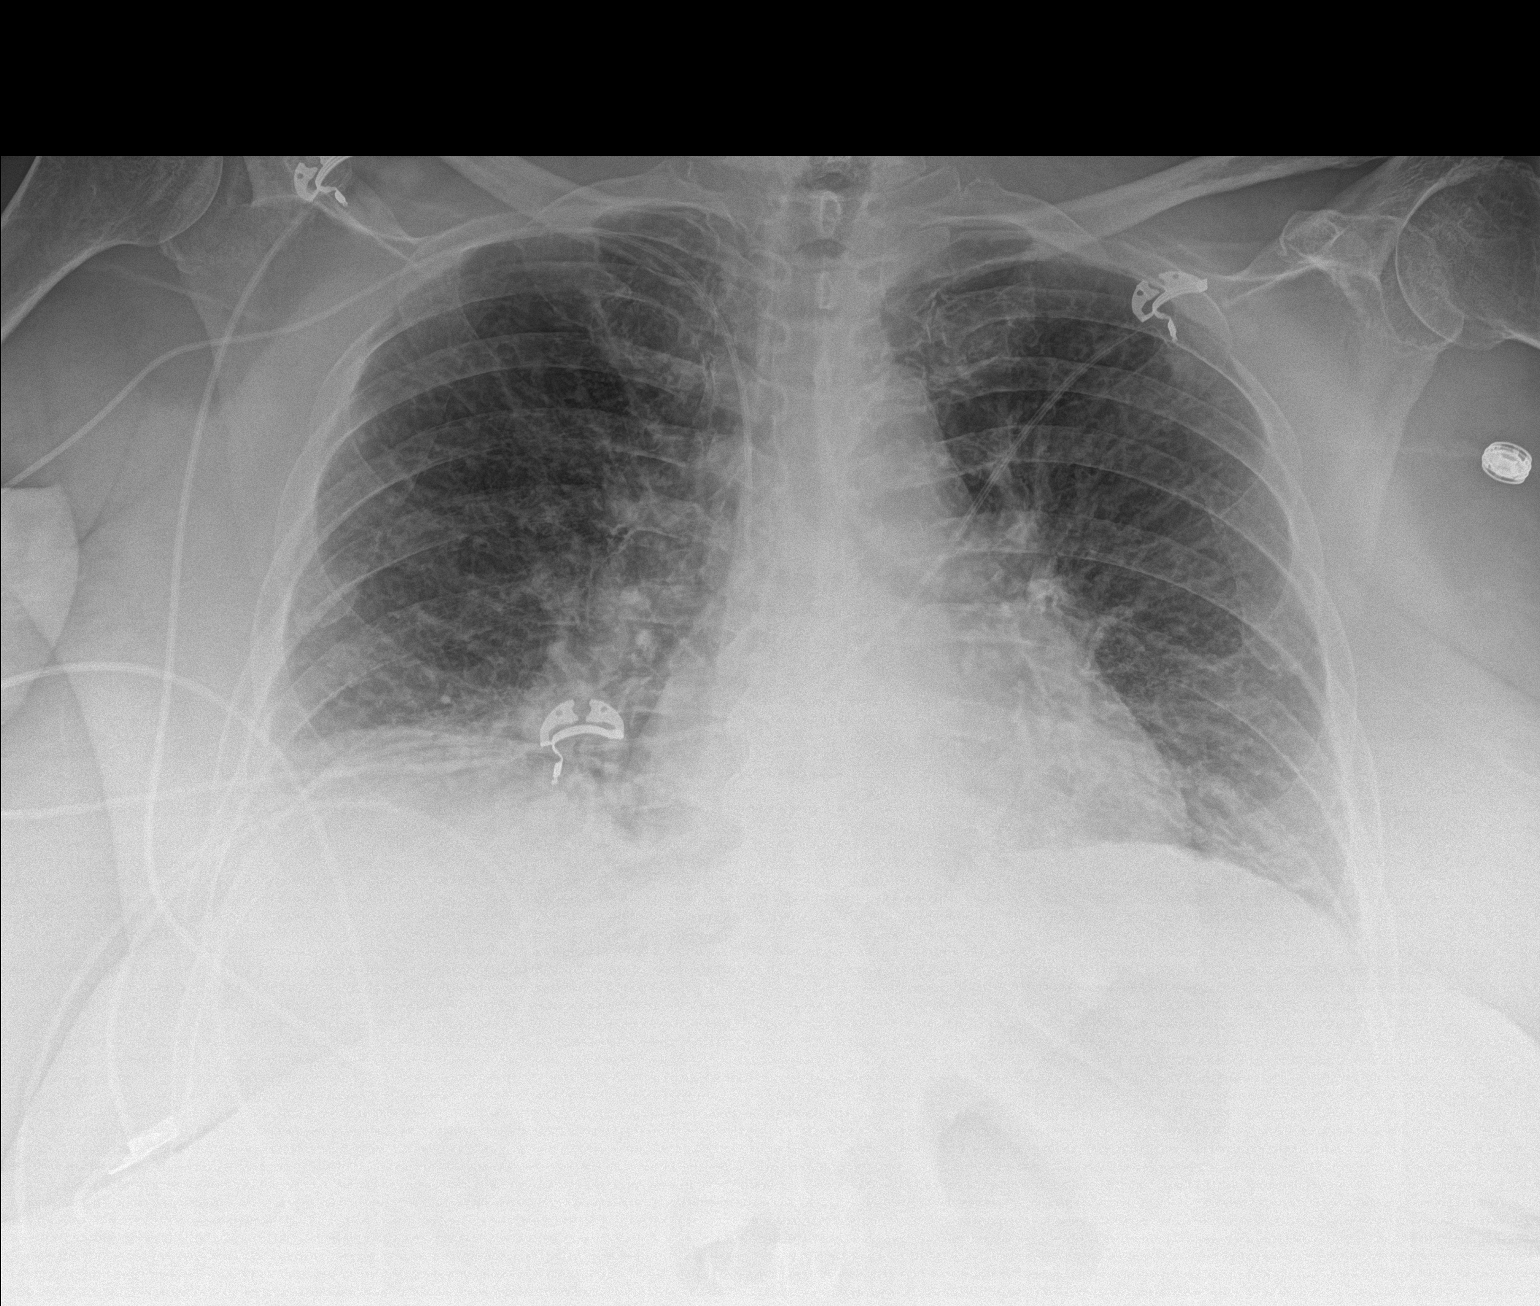

[1 of 1 positions shown; findings below may reference images not displayed]

FINDINGS: Diffuse bilateral mild interstitial thickening. No pleural effusion
or pneumothorax. Stable cardiomediastinal silhouette. Right-sided
PICC line with the tip projecting over the SVC. No acute osseous
abnormality.
IMPRESSION: 1. Bilateral mild interstitial thickening likely reflecting mild
interstitial edema versus infection.
